# Patient Record
Sex: Male | Born: 1935
Health system: Southern US, Community
[De-identification: ages and names within clinical notes are randomized; demographics above are authoritative.]

## PROBLEM LIST (undated history)

## (undated) DIAGNOSIS — C787 Secondary malignant neoplasm of liver and intrahepatic bile duct: Principal | ICD-10-CM

## (undated) DIAGNOSIS — C189 Malignant neoplasm of colon, unspecified: Secondary | ICD-10-CM

## (undated) DIAGNOSIS — I10 Essential (primary) hypertension: Secondary | ICD-10-CM

## (undated) HISTORY — DX: Malignant neoplasm of colon, unspecified: C18.9

## (undated) HISTORY — DX: Secondary malignant neoplasm of liver and intrahepatic bile duct: C78.7

## (undated) HISTORY — PX: CATARACT EXTRACTION: SUR2

## (undated) HISTORY — PX: COLOSTOMY: SHX63

---

## 2006-06-18 ENCOUNTER — Ambulatory Visit (HOSPITAL_COMMUNITY): Admission: RE | Admit: 2006-06-18 | Discharge: 2006-06-18 | Payer: Self-pay | Admitting: Ophthalmology

## 2012-03-12 ENCOUNTER — Emergency Department (INDEPENDENT_AMBULATORY_CARE_PROVIDER_SITE_OTHER)
Admission: EM | Admit: 2012-03-12 | Discharge: 2012-03-12 | Disposition: A | Discharge status: Home / Self Care | Payer: Medicare Other | Source: Home / Self Care | Attending: Family Medicine | Admitting: Family Medicine

## 2012-03-12 ENCOUNTER — Encounter (HOSPITAL_COMMUNITY): Payer: Self-pay

## 2012-03-12 DIAGNOSIS — T63481A Toxic effect of venom of other arthropod, accidental (unintentional), initial encounter: Secondary | ICD-10-CM

## 2012-03-12 DIAGNOSIS — T6391XA Toxic effect of contact with unspecified venomous animal, accidental (unintentional), initial encounter: Secondary | ICD-10-CM

## 2012-03-12 MED ORDER — DIPHENHYDRAMINE HCL 25 MG PO CAPS
25.0000 mg | ORAL_CAPSULE | Freq: Once | ORAL | Status: AC
  Administered 2012-03-12: 25 mg via ORAL

## 2012-03-12 MED ORDER — DIPHENHYDRAMINE HCL 25 MG PO CAPS
ORAL_CAPSULE | ORAL | Status: AC
  Filled 2012-03-12: qty 1

## 2012-03-12 MED ORDER — FLUTICASONE PROPIONATE 0.05 % EX CREA: TOPICAL_CREAM | Freq: Two times a day (BID) | CUTANEOUS | Status: DC

## 2012-03-12 NOTE — ED Provider Notes (Signed)
History     CSN: [id number]  Arrival date & time 03/12/12  1421   First MD Initiated Contact with Patient 03/12/12 1506      Chief Complaint  Patient presents with  . Insect Bite    (Consider location/radiation/quality/duration/timing/severity/associated sxs/prior treatment) Patient is a 78 y.o. male presenting with hand injury. The history is provided by the patient.  Hand Injury  The incident occurred yesterday. The incident occurred at home. Injury mechanism: stung by yellow jackets on dorsum of hand.continues swollen. The pain is present in the right hand.    History reviewed. No pertinent past medical history.  History reviewed. No pertinent past surgical history.  History reviewed. No pertinent family history.  History  Substance Use Topics  . Smoking status: Never Smoker   . Smokeless tobacco: Not on file  . Alcohol Use: Yes      Review of Systems  Constitutional: Negative.   HENT: Negative for trouble swallowing.   Respiratory: Negative for shortness of breath and wheezing.   Cardiovascular: Negative for chest pain and palpitations.    Allergies  Review of patient's allergies indicates no known allergies.  Home Medications   Current Outpatient Rx  Name Route Sig Dispense Refill  . FLUTICASONE PROPIONATE 0.05 % EX CREA Topical Apply topically 2 (two) times daily. 30 g 0    BP 149/76  Pulse 73  Temp 98.3 F (36.8 C) (Oral)  Resp 16  SpO2 96%  Physical Exam  Nursing note and vitals reviewed. Constitutional: He is oriented to person, place, and time. He appears well-developed and well-nourished.  HENT:  Mouth/Throat: Oropharynx is clear and moist.  Cardiovascular: Regular rhythm and normal heart sounds.   Pulmonary/Chest: Breath sounds normal. He has no wheezes.  Neurological: He is alert and oriented to person, place, and time.  Skin: Skin is warm and dry.       Local sts at sting sites x2 on dorsum of right hand., no erythema or tenderness.     ED Course  Procedures (including critical care time)  Labs Reviewed - No data to display No results found.   1. Local reaction to insect sting       MDM          Linna Hoff, MD 03/12/12 [phone]

## 2012-03-12 NOTE — ED Notes (Signed)
Bitten several times yesterday on right hand while cutting grass; hand swollen, NAD

## 2012-03-12 NOTE — Discharge Instructions (Signed)
Use ice, benadryl and cream for swelling and itching as needed.

## 2012-11-06 ENCOUNTER — Encounter (HOSPITAL_COMMUNITY): Payer: Self-pay

## 2012-11-06 ENCOUNTER — Inpatient Hospital Stay (HOSPITAL_COMMUNITY): Payer: Medicare Other

## 2012-11-06 ENCOUNTER — Inpatient Hospital Stay (HOSPITAL_COMMUNITY)
Admission: EM | Admit: 2012-11-06 | Discharge: 2012-11-11 | DRG: 378 | Disposition: A | Discharge status: Home Health | Payer: Medicare Other | Attending: Internal Medicine | Admitting: Internal Medicine

## 2012-11-06 ENCOUNTER — Encounter (HOSPITAL_COMMUNITY)
Admission: EM | Disposition: A | Discharge status: Home Health | Payer: Self-pay | Source: Home / Self Care | Attending: Family Medicine

## 2012-11-06 DIAGNOSIS — Z531 Procedure and treatment not carried out because of patient's decision for reasons of belief and group pressure: Secondary | ICD-10-CM

## 2012-11-06 DIAGNOSIS — R5383 Other fatigue: Secondary | ICD-10-CM | POA: Diagnosis present

## 2012-11-06 DIAGNOSIS — K264 Chronic or unspecified duodenal ulcer with hemorrhage: Principal | ICD-10-CM

## 2012-11-06 DIAGNOSIS — IMO0001 Reserved for inherently not codable concepts without codable children: Secondary | ICD-10-CM

## 2012-11-06 DIAGNOSIS — R5381 Other malaise: Secondary | ICD-10-CM | POA: Diagnosis present

## 2012-11-06 DIAGNOSIS — R531 Weakness: Secondary | ICD-10-CM

## 2012-11-06 DIAGNOSIS — Z8711 Personal history of peptic ulcer disease: Secondary | ICD-10-CM

## 2012-11-06 DIAGNOSIS — I10 Essential (primary) hypertension: Secondary | ICD-10-CM | POA: Diagnosis present

## 2012-11-06 DIAGNOSIS — K2980 Duodenitis without bleeding: Secondary | ICD-10-CM | POA: Diagnosis present

## 2012-11-06 DIAGNOSIS — K922 Gastrointestinal hemorrhage, unspecified: Secondary | ICD-10-CM

## 2012-11-06 DIAGNOSIS — D62 Acute posthemorrhagic anemia: Secondary | ICD-10-CM

## 2012-11-06 DIAGNOSIS — F1010 Alcohol abuse, uncomplicated: Secondary | ICD-10-CM

## 2012-11-06 HISTORY — PX: ESOPHAGOGASTRODUODENOSCOPY: SHX5428

## 2012-11-06 LAB — COMPREHENSIVE METABOLIC PANEL
ALT: 5 U/L (ref 0–53)
AST: 14 U/L (ref 0–37)
Albumin: 2.8 g/dL — ABNORMAL LOW (ref 3.5–5.2)
CO2: 27 mEq/L (ref 19–32)
Calcium: 9 mg/dL (ref 8.4–10.5)
Chloride: 104 mEq/L (ref 96–112)
Creatinine, Ser: 1.22 mg/dL (ref 0.50–1.35)
GFR calc non Af Amer: 54 mL/min — ABNORMAL LOW (ref >90)
Sodium: 138 mEq/L (ref 135–145)

## 2012-11-06 LAB — TYPE AND SCREEN: Antibody Screen: NEGATIVE

## 2012-11-06 LAB — CBC WITH DIFFERENTIAL/PLATELET
Basophils Relative: 0 % (ref 0–1)
Eosinophils Absolute: 0.1 10*3/uL (ref 0.0–0.7)
Eosinophils Relative: 1 % (ref 0–5)
Hemoglobin: 7.8 g/dL — ABNORMAL LOW (ref 13.0–17.0)
Lymphs Abs: 1.7 10*3/uL (ref 0.7–4.0)
MCH: 28.6 pg (ref 26.0–34.0)
MCHC: 34.2 g/dL (ref 30.0–36.0)
MCV: 83.5 fL (ref 78.0–100.0)
Monocytes Absolute: 0.6 10*3/uL (ref 0.1–1.0)
Monocytes Relative: 5 % (ref 3–12)
RBC: 2.73 MIL/uL — ABNORMAL LOW (ref 4.22–5.81)

## 2012-11-06 LAB — CBC
Hemoglobin: 8.2 g/dL — ABNORMAL LOW (ref 13.0–17.0)
Hemoglobin: 7.3 g/dL — ABNORMAL LOW (ref 13.0–17.0)
MCH: 28.2 pg (ref 26.0–34.0)
MCH: 28.3 pg (ref 26.0–34.0)
MCHC: 33.9 g/dL (ref 30.0–36.0)
MCHC: 34.1 g/dL (ref 30.0–36.0)
MCHC: 33.8 g/dL (ref 30.0–36.0)
Platelets: 161 10*3/uL (ref 150–400)
Platelets: 145 10*3/uL — ABNORMAL LOW (ref 150–400)
Platelets: 149 10*3/uL — ABNORMAL LOW (ref 150–400)
RBC: 2.6 MIL/uL — ABNORMAL LOW (ref 4.22–5.81)
RDW: 15 % (ref 11.5–15.5)
RDW: 15.4 % (ref 11.5–15.5)
WBC: 14.1 10*3/uL — ABNORMAL HIGH (ref 4.0–10.5)
WBC: 13.4 10*3/uL — ABNORMAL HIGH (ref 4.0–10.5)

## 2012-11-06 LAB — OCCULT BLOOD, POC DEVICE: Fecal Occult Bld: POSITIVE — AB

## 2012-11-06 LAB — ABO/RH: ABO/RH(D): O POS

## 2012-11-06 SURGERY — EGD (ESOPHAGOGASTRODUODENOSCOPY)
Anesthesia: Moderate Sedation

## 2012-11-06 MED ORDER — LORAZEPAM 2 MG/ML IJ SOLN: 0.0000 mg | Freq: Two times a day (BID) | INTRAMUSCULAR | Status: AC

## 2012-11-06 MED ORDER — LORAZEPAM 2 MG/ML IJ SOLN: 0.0000 mg | Freq: Four times a day (QID) | INTRAMUSCULAR | Status: AC

## 2012-11-06 MED ORDER — ADULT MULTIVITAMIN W/MINERALS CH
1.0000 | ORAL_TABLET | Freq: Every day | ORAL | Status: DC
  Administered 2012-11-06 – 2012-11-11 (×5): 1 via ORAL
  Filled 2012-11-06 (×6): qty 1

## 2012-11-06 MED ORDER — THIAMINE HCL 100 MG/ML IJ SOLN
100.0000 mg | Freq: Every day | INTRAMUSCULAR | Status: DC
  Filled 2012-11-06 (×6): qty 1

## 2012-11-06 MED ORDER — SODIUM CHLORIDE 0.9 % IJ SOLN
3.0000 mL | Freq: Two times a day (BID) | INTRAMUSCULAR | Status: DC
  Administered 2012-11-08 – 2012-11-10 (×6): 3 mL via INTRAVENOUS

## 2012-11-06 MED ORDER — ACETAMINOPHEN 325 MG PO TABS: 650.0000 mg | ORAL_TABLET | Freq: Four times a day (QID) | ORAL | Status: DC | PRN

## 2012-11-06 MED ORDER — FENTANYL CITRATE 0.05 MG/ML IJ SOLN
INTRAMUSCULAR | Status: AC
  Filled 2012-11-06: qty 2

## 2012-11-06 MED ORDER — ONDANSETRON HCL 4 MG PO TABS: 4.0000 mg | ORAL_TABLET | Freq: Four times a day (QID) | ORAL | Status: DC | PRN

## 2012-11-06 MED ORDER — MIDAZOLAM HCL 10 MG/2ML IJ SOLN
INTRAMUSCULAR | Status: DC | PRN
  Administered 2012-11-06 (×2): 2 mg via INTRAVENOUS

## 2012-11-06 MED ORDER — SODIUM CHLORIDE 0.9 % IV SOLN
INTRAVENOUS | Status: DC
  Administered 2012-11-06: 13:00:00 via INTRAVENOUS
  Administered 2012-11-08: 1000 mL via INTRAVENOUS

## 2012-11-06 MED ORDER — EPINEPHRINE HCL 0.1 MG/ML IJ SOLN
INTRAMUSCULAR | Status: AC
  Filled 2012-11-06: qty 10

## 2012-11-06 MED ORDER — MORPHINE SULFATE 2 MG/ML IJ SOLN: 2.0000 mg | INTRAMUSCULAR | Status: DC | PRN

## 2012-11-06 MED ORDER — LORAZEPAM 1 MG PO TABS: 1.0000 mg | ORAL_TABLET | Freq: Four times a day (QID) | ORAL | Status: AC | PRN

## 2012-11-06 MED ORDER — ACETAMINOPHEN 650 MG RE SUPP: 650.0000 mg | Freq: Four times a day (QID) | RECTAL | Status: DC | PRN

## 2012-11-06 MED ORDER — FOLIC ACID 1 MG PO TABS
1.0000 mg | ORAL_TABLET | Freq: Every day | ORAL | Status: DC
  Administered 2012-11-06 – 2012-11-11 (×5): 1 mg via ORAL
  Filled 2012-11-06 (×6): qty 1

## 2012-11-06 MED ORDER — ONDANSETRON HCL 4 MG/2ML IJ SOLN: 4.0000 mg | Freq: Four times a day (QID) | INTRAMUSCULAR | Status: DC | PRN

## 2012-11-06 MED ORDER — VITAMIN B-1 100 MG PO TABS
100.0000 mg | ORAL_TABLET | Freq: Every day | ORAL | Status: DC
  Administered 2012-11-06 – 2012-11-11 (×5): 100 mg via ORAL
  Filled 2012-11-06 (×6): qty 1

## 2012-11-06 MED ORDER — INFLUENZA VIRUS VACC SPLIT PF IM SUSP
0.5000 mL | INTRAMUSCULAR | Status: AC
  Filled 2012-11-06: qty 0.5

## 2012-11-06 MED ORDER — FENTANYL CITRATE 0.05 MG/ML IJ SOLN
INTRAMUSCULAR | Status: DC | PRN
  Administered 2012-11-06: 15 ug via INTRAVENOUS
  Administered 2012-11-06: 10 ug via INTRAVENOUS
  Administered 2012-11-06: 25 ug via INTRAVENOUS

## 2012-11-06 MED ORDER — SODIUM CHLORIDE 0.9 % IV SOLN: INTRAVENOUS | Status: DC

## 2012-11-06 MED ORDER — BUTAMBEN-TETRACAINE-BENZOCAINE 2-2-14 % EX AERO
INHALATION_SPRAY | CUTANEOUS | Status: DC | PRN
  Administered 2012-11-06: 2 via TOPICAL

## 2012-11-06 MED ORDER — LORAZEPAM 2 MG/ML IJ SOLN: 1.0000 mg | Freq: Four times a day (QID) | INTRAMUSCULAR | Status: AC | PRN

## 2012-11-06 MED ORDER — PANTOPRAZOLE SODIUM 40 MG IV SOLR
40.0000 mg | Freq: Once | INTRAVENOUS | Status: AC
  Administered 2012-11-06: 40 mg via INTRAVENOUS
  Filled 2012-11-06: qty 40

## 2012-11-06 MED ORDER — SODIUM CHLORIDE 0.9 % IV SOLN
8.0000 mg/h | INTRAVENOUS | Status: DC
  Administered 2012-11-06 – 2012-11-08 (×3): 8 mg/h via INTRAVENOUS
  Filled 2012-11-06 (×9): qty 80

## 2012-11-06 MED ORDER — SODIUM CHLORIDE 0.9 % IJ SOLN
INTRAMUSCULAR | Status: DC | PRN
  Administered 2012-11-06: 11:00:00

## 2012-11-06 MED ORDER — SODIUM CHLORIDE 0.9 % IV BOLUS (SEPSIS)
500.0000 mL | Freq: Once | INTRAVENOUS | Status: AC
  Administered 2012-11-06: 500 mL via INTRAVENOUS

## 2012-11-06 MED ORDER — MIDAZOLAM HCL 5 MG/ML IJ SOLN
INTRAMUSCULAR | Status: AC
  Filled 2012-11-06: qty 2

## 2012-11-06 MED ORDER — DIPHENHYDRAMINE HCL 50 MG/ML IJ SOLN
INTRAMUSCULAR | Status: AC
  Filled 2012-11-06: qty 1

## 2012-11-06 NOTE — ED Notes (Signed)
Returned from xray

## 2012-11-06 NOTE — Op Note (Signed)
[facility] [address] [place], [address]   ENDOSCOPY PROCEDURE REPORT  PATIENT: Harold Price, Harold Price  MR#: [id number] BIRTHDATE: 27-Dec-1929 , 82  yrs. old GENDER: Male  ENDOSCOPIST: Charlott Rakes, MD REFERRED XB:[id number] team  PROCEDURE DATE:  11/06/2012 PROCEDURE:   EGD w/ control of bleeding ASA CLASS:   Class II INDICATIONS:Melena.   Hematemesis. MEDICATIONS: Fentanyl-Detailed 60 mcg IV, Versed 5 mg IV, Cetacaine spray x 2, and Epinephrine 7 cc   submucosally  TOPICAL ANESTHETIC:  DESCRIPTION OF PROCEDURE:   After the risks benefits and alternatives of the procedure were thoroughly explained, informed consent was obtained.  The Pentax Gastroscope [id number]  endoscope was introduced through the mouth and advanced to the second portion of the duodenum , limited by Without limitations.   The instrument was slowly withdrawn as the mucosa was fully examined.     FINDINGS: The endoscope was inserted into the oropharynx and esophagus was intubated.  The gastroesophageal junction was noted to be 40 cm from the incisors. Esophagus was normal. Endoscope was advanced into the stomach, which revealed clear fluid and normal-appearing gastric mucosa.  The endoscope was advanced to the duodenal bulb where a 4 mm clean-based superficial ulcer was noted in the distal portion. At the genu was a 6 mm ulcer where a small raised red spot was seen and a diffuse flat red area was noted in the base that was friable and started bleeding during the procedure. The second portion of the duodenum was unremarkable. Epinephrine:saline 1:10,000 U mixture was injected submucosally around the ulcer with the red area and good blanching and blebbing was noted. A total of 7cc of OZH:[id number] mixture was injected. The endoscope was withdrawn back into the stomach and retroflexion was normal.  COMPLICATIONS: None  ENDOSCOPIC IMPRESSION:     Duodenal ulcer at genu with  bleeding stigmata -s/p epi injection Clean-based duodenal bulb ulcer noted  RECOMMENDATIONS: Continue Protonix infusion; Ice chips and sips of water ok and if stable can slowly advance tomorrow   REPEAT EXAM: N/A  _______________________________ Charlott Rakes, MD eSigned:  Charlott Rakes, MD 11/06/2012 11:04 AM    CC:  PATIENT NAME:  Preet, Perrier MR#: [id number]

## 2012-11-06 NOTE — ED Notes (Signed)
Patient transported to X-ray 

## 2012-11-06 NOTE — Consult Note (Signed)
Referring Provider: Dr. Irene Limbo Primary Care Physician:  No primary provider on file. Primary Gastroenterologist:  Gentry Fitz  Reason for Consultation:  GI bleed  HPI: Jerimey Burridge is a 79 y.o. male presents with acute onset of black, tarry stools X 1 and vomiting coffee grounds emesis X 1 that started this morning. Had one episode of vomiting after that which was clear. No further GI bleeding since that time. Been nausea for awhile. Heartburn for years. Reports intermittent abdominal pain for years that she would take Tums prn for. Was lightheaded and dizzy today. Denies hematochezia. Denies NSAIDs. Drinks 3-4 beers/day for years. Personal history of stomach ulcer in 2006 that was found after a GI bleed but he denies that it was bleeding when scope was reportedly done. Hgb 7.8. BUN 48. Jehovah's Witness.  Past Medical History  Diagnosis Date  . Hypertension   . PUD (peptic ulcer disease)     2006    Past Surgical History  Procedure Laterality Date  . Cataract extraction Right     Prior to Admission medications   Not on File    Scheduled Meds: Continuous Infusions: PRN Meds:.    Allergies as of 11/06/2012  . (No Known Allergies)    Family History  Problem Relation Age of Onset  . Ulcers Brother     History   Social History  . Marital Status: Married    Spouse Name: N/A    Number of Children: N/A  . Years of Education: N/A   Occupational History  . Not on file.   Social History Main Topics  . Smoking status: Never Smoker   . Smokeless tobacco: Not on file  . Alcohol Use: Yes  . Drug Use: No  . Sexually Active: Not on file   Other Topics Concern  . Not on file   Social History Narrative  . No narrative on file    Review of Systems: All negative except as stated above in HPI.  Physical Exam: Vital signs: Filed Vitals:   11/06/12 0800  BP: 128/59  Pulse: 82  Temp: 98.2  Resp: 20     General:   Elderly, Alert,  Well-developed, well-nourished,  pleasant and cooperative in NAD HEENT: anicteric Neck: supple, nontender Lungs:  Clear throughout to auscultation.   No wheezes, crackles, or rhonchi. No acute distress. Heart:  Regular rate and rhythm; no murmurs, clicks, rubs,  or gallops. Abdomen: soft, NT, ND, +BS  Rectal:  Deferred Ext: no edema  GI:  Lab Results:  Recent Labs  11/06/12 0429  WBC 11.4*  HGB 7.8*  HCT 22.8*  PLT 132*   BMET  Recent Labs  11/06/12 0429  NA 138  K 4.9  CL 104  CO2 27  GLUCOSE 163*  BUN 48*  CREATININE 1.22  CALCIUM 9.0   LFT  Recent Labs  11/06/12 0429  PROT 5.0*  ALBUMIN 2.8*  AST 14  ALT 5  ALKPHOS 31*  BILITOT 0.1*   PT/INR  Recent Labs  11/06/12 0429  LABPROT 13.7  INR 1.06     Studies/Results: Dg Abd Acute W/chest  11/06/2012  *RADIOLOGY REPORT*  Clinical Data: Nausea and vomiting.  ACUTE ABDOMEN SERIES (ABDOMEN 2 VIEW & CHEST 1 VIEW)  Comparison: 06/15/2006  Findings: No evidence for free air on the decubitus image. Nonspecific bowel gas pattern.  Degenerative changes in the spine. Lungs are clear without focal disease. Heart and mediastinum are within normal limits.  IMPRESSION: No acute findings.   Original Report Authenticated  By: Richarda Overlie, M.D.     Impression/Plan: 79yo with acute onset of upper GI bleed with one episode of melena and one episode of coffee grounds emesis without preceding retching or vomiting. Elevated BUN and Hgb 7.8. Suspect peptic ulcer bleed and needs EGD today. Agree with Protonix drip. NPO. Patient aware and agreeable to do procedure. Informed consent will be obtained prior to EGD.    LOS: 0 days   Ajeenah Heiny C.  11/06/2012, 8:50 AM

## 2012-11-06 NOTE — Interval H&P Note (Signed)
History and Physical Interval Note:  11/06/2012 10:26 AM  Harold Price  has presented today for surgery, with the diagnosis of Gi bleed   The various methods of treatment have been discussed with the patient and family. After consideration of risks, benefits and other options for treatment, the patient has consented to  Procedure(s): ESOPHAGOGASTRODUODENOSCOPY (EGD) (N/A) as a surgical intervention .  The patient's history has been reviewed, patient examined, no change in status, stable for surgery.  I have reviewed the patient's chart and labs.  Questions were answered to the patient's satisfaction.     Hakan Nudelman C.

## 2012-11-06 NOTE — H&P (Signed)
History and Physical  Harold Price ZOX:[id number] DOB: 12/25/1929 DOA: 11/06/2012  Referring physician: Zadie Rhine, MD PCP: No primary provider on file. None  Chief Complaint: dark stool and vomit  HPI:  79 year old man with history of peptic ulcer disease presented to emergency department with history of dark stools, coffee-ground emesis. Initial evaluation notable for anemia and patient admitted for presumed upper GI bleed.  Patient reports a history of ulcer disease approximately 8 years ago. He underwent upper and lower endoscopy in [place]. He was put on medication but is not sure whether he had any other treatment. He has had no bleeding since. He drinks 3-4 drinks of beer or liquor a day. He denies NSAID use except for aspirin once or twice per week. Last night he had stomach upset which resolved with TUMS at approximately 10 PM. At 1 AM he woke up and went to the bathroom, he had generalized weakness and almost passed out. He had a very dark bowel movement. He had several episodes of dark emesis. He has not had any further vomiting or bowel movements since presentation to the emergency department.  In the emergency department noted be afebrile, normotensive, vitals stable. Hemoglobin 7.8, MCV normal, BUN 48 with normal creatinine. Acute abdominal series negative. EKG nonacute.  Review of Systems:  Negative for visual changes, sore throat, rash, new muscle aches, chest pain, SOB, dysuria, nnausea  Positive for subjective fever, dizzyness, stomach pain  Past Medical History  Diagnosis Date  . Hypertension   . PUD (peptic ulcer disease)     2006    Past Surgical History  Procedure Laterality Date  . Cataract extraction Right     Social History:  reports that he has never smoked. He does not have any smokeless tobacco history on file. He reports that  drinks alcohol. He reports that he does not use illicit drugs. 3-4 drinks/day sometimes beer sometimes liquor  No Known  Allergies  Family History  Problem Relation Age of Onset  . Ulcers Brother      Prior to Admission medications   Not on File   Physical Exam: Filed Vitals:   11/06/12 0415 11/06/12 0445 11/06/12 0501 11/06/12 0621  BP: 122/52 121/56 114/63 113/62  Pulse: 103 86 87 82  Temp:    98.2 F (36.8 C)  TempSrc:    Oral  Resp: 20 14 15 14   SpO2: 100% 100% 100% 100%   General:  Examined in ED. Appears calm and comfortable. Eyes: Left pupil round and reactive, right pupil eccentric and nonreactive, arcus senilis bilateral, normal lids, irises  ENT: grossly normal hearing, lips & tongue Neck: no LAD, masses or thyromegaly Cardiovascular: RRR, no m/r/g. No LE edema. Respiratory: CTA bilaterally, no w/r/r. Normal respiratory effort. Abdomen: soft, ntnd, no masses appreciated Skin: no rash or induration seen on limited exam, chronic skin changes bilateral anterior distal lower extremities Musculoskeletal: grossly normal tone BUE/BLE Psychiatric: grossly normal mood and affect, speech fluent and appropriate Neurologic: grossly non-focal.  Wt Readings from Last 3 Encounters:  No data found for Wt    Labs on Admission:  Basic Metabolic Panel:  Recent Labs Lab 11/06/12 0429  NA 138  K 4.9  CL 104  CO2 27  GLUCOSE 163*  BUN 48*  CREATININE 1.22  CALCIUM 9.0    Liver Function Tests:  Recent Labs Lab 11/06/12 0429  AST 14  ALT 5  ALKPHOS 31*  BILITOT 0.1*  PROT 5.0*  ALBUMIN 2.8*   CBC:  Recent  Labs Lab 11/06/12 0429  WBC 11.4*  NEUTROABS 9.0*  HGB 7.8*  HCT 22.8*  MCV 83.5  PLT 132*     Radiological Exams on Admission: Dg Abd Acute W/chest  11/06/2012  *RADIOLOGY REPORT*  Clinical Data: Nausea and vomiting.  ACUTE ABDOMEN SERIES (ABDOMEN 2 VIEW & CHEST 1 VIEW)  Comparison: 06/15/2006  Findings: No evidence for free air on the decubitus image. Nonspecific bowel gas pattern.  Degenerative changes in the spine. Lungs are clear without focal disease. Heart and  mediastinum are within normal limits.  IMPRESSION: No acute findings.   Original Report Authenticated By: Richarda Overlie, M.D.     EKG: Independently reviewed. Sinus rhythm, PACs, nonspecific ST changes.   Principal Problem:   Acute GI bleeding Active Problems:   Acute blood loss anemia   Generalized weakness   Alcohol abuse   History of ulcer disease   Refusal of blood transfusions as patient is Jehovah's Witness   Assessment/Plan 1. Acute GI bleed: Likely upper given history of peptic ulcer disease, history of present illness and elevated BUN. Suspect significant acute blood loss given normal MCV. IV Protonix infusion, GI consultation. Serial CBC. 2. Acute blood loss anemia: Serial CBC. Jehovah's Witness and refuses blood blood products. I discussed the suspected severe nature of acute blood loss with him, the possibility of further blood loss resulting in severe disability or even death, he understands but nevertheless refuses any blood products. 3. Generalized weakness: Secondary to acute blood loss 4. Alcohol abuse: CIWA. Monitor for drop.  5. History of ulcer 6. Jehovah's Witness: No blood products.   Code Status: Full code Family Communication: none present Disposition Plan/Anticipated LOS: admit, 3-4 days  Time spent: 60 minutes  Brendia Sacks, MD  [facility] Pager [phone] 11/06/2012, 7:16 AM

## 2012-11-06 NOTE — Progress Notes (Signed)
Brought in from the ED and took down to endoscopy  at 9:38 by bed stable.

## 2012-11-06 NOTE — Brief Op Note (Signed)
Duodenal ulcer with bleeding stigmata - s/p epi injection. Sips of clears and ice chips only today. Continue Protonix infusion.

## 2012-11-06 NOTE — Progress Notes (Signed)
Received patient from endoscopy, patient alert and oriented x4.  No acute distress. Family at the bedside.  VSS and charted in doc flow.  Tele monitoring placed back on patient.  Will continue to monitor.

## 2012-11-06 NOTE — ED Provider Notes (Signed)
History     CSN: [id number]  Arrival date & time 11/06/12  4098   First MD Initiated Contact with Patient 11/06/12 [phone]      Chief Complaint  Patient presents with  . Nausea  . Emesis     Patient is a 79 y.o. male presenting with vomiting. The history is provided by the patient.  Emesis Severity:  Moderate Duration:  7 hours Timing:  Constant Emesis appearance: black emesis. Progression:  Worsening Chronicity:  New Relieved by:  Nothing Worsened by:  Nothing tried Associated symptoms: abdominal pain   Risk factors: alcohol use   pt reports he started to have vomiting and diarrhea earlier and he reports both vomit and stool were black.  He reports CP with vomiting.  He reports mild abdominal pain. No syncope reported  He reports he drinks ETOH frequently.  He does mention h/o ulcers in the past   Past Medical History  Diagnosis Date  . Hypertension     No past surgical history on file.  No family history on file.  History  Substance Use Topics  . Smoking status: Never Smoker   . Smokeless tobacco: Not on file  . Alcohol Use: Yes      Review of Systems  Constitutional: Positive for fatigue. Negative for fever.  Respiratory: Negative for shortness of breath.   Gastrointestinal: Positive for vomiting and abdominal pain.  Neurological: Positive for weakness.  All other systems reviewed and are negative.    Allergies  Review of patient's allergies indicates no known allergies.  Home Medications   Current Outpatient Rx  Name  Route  Sig  Dispense  Refill  . fluticasone (CUTIVATE) 0.05 % cream   Topical   Apply topically 2 (two) times daily.   30 g   0     BP 115/54  Temp(Src) 97.9 F (36.6 C) (Oral)  SpO2 99% BP 113/62  Pulse 82  Temp(Src) 98.2 F (36.8 C) (Oral)  Resp 14  SpO2 100%  Physical Exam CONSTITUTIONAL: Well developed/well nourished HEAD AND FACE: Normocephalic/atraumatic EYES: EOMI/PERRL ENMT: Mucous membranes dry NECK:  supple no meningeal signs SPINE:entire spine nontender CV: S1/S2 noted, no murmurs/rubs/gallops noted LUNGS: Lungs are clear to auscultation bilaterally, no apparent distress ABDOMEN: soft, nontender, no rebound or guarding GU:no cva tenderness Rectal - stool color black, hemoccult positive NEURO: Pt is awake/alert, moves all extremitiesx4 EXTREMITIES: pulses normal, full ROM SKIN: warm PSYCH: no abnormalities of mood noted  ED Course  Procedures  Labs Reviewed  CBC WITH DIFFERENTIAL - Abnormal; Notable for the following:    WBC 11.4 (*)    RBC 2.73 (*)    Hemoglobin 7.8 (*)    HCT 22.8 (*)    Platelets 132 (*)    Neutrophils Relative 79 (*)    Neutro Abs 9.0 (*)    All other components within normal limits  OCCULT BLOOD, POC DEVICE - Abnormal; Notable for the following:    Fecal Occult Bld POSITIVE (*)    All other components within normal limits  PROTIME-INR  APTT  COMPREHENSIVE METABOLIC PANEL  TYPE AND SCREEN  ABO/RH   5:03 AM Pt with acute GI bleed.  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 IV fluids and protonix ordered Will need admission His BP is currently >100 6:40 AM PT UPDATED ON PLAN AWAITING CALL BACK FROM HOSPITALIST 7:28 AM D/w dr Glenna Durand with [facility] He requests acute abdominal series and he will f/u on results Will  place on stepdown He will call GI He understands pt does not wish to have blood products   MDM  Nursing notes including past medical history and social history reviewed and considered in documentation Labs/vital reviewed and considered        Date: 11/06/2012  Rate: 85  Rhythm: normal sinus rhythm  QRS Axis: normal  Intervals: normal  ST/T Wave abnormalities: nonspecific ST changes  Conduction Disutrbances:none  Narrative Interpretation:   Old EKG Reviewed: none available AT TIME OF INTERPRETATION    Joya Gaskins, MD 11/06/12 [phone]

## 2012-11-06 NOTE — ED Notes (Signed)
Pt states he has been vomiting dark black emesis and had dark BM since yesterday

## 2012-11-07 ENCOUNTER — Encounter (HOSPITAL_COMMUNITY): Payer: Self-pay

## 2012-11-07 ENCOUNTER — Encounter (HOSPITAL_COMMUNITY)
Admission: EM | Disposition: A | Discharge status: Home Health | Payer: Self-pay | Source: Home / Self Care | Attending: Family Medicine

## 2012-11-07 ENCOUNTER — Inpatient Hospital Stay (HOSPITAL_COMMUNITY): Payer: Medicare Other

## 2012-11-07 DIAGNOSIS — K264 Chronic or unspecified duodenal ulcer with hemorrhage: Principal | ICD-10-CM

## 2012-11-07 HISTORY — PX: ESOPHAGOGASTRODUODENOSCOPY: SHX5428

## 2012-11-07 LAB — CBC
HCT: 19.4 % — ABNORMAL LOW (ref 39.0–52.0)
HCT: 17.9 % — ABNORMAL LOW (ref 39.0–52.0)
Hemoglobin: 6.4 g/dL — CL (ref 13.0–17.0)
MCH: 27.9 pg (ref 26.0–34.0)
MCHC: 33 g/dL (ref 30.0–36.0)
MCV: 84.7 fL (ref 78.0–100.0)
MCV: 83.6 fL (ref 78.0–100.0)
MCV: 84.2 fL (ref 78.0–100.0)
Platelets: 151 K/uL (ref 150–400)
Platelets: 131 10*3/uL — ABNORMAL LOW (ref 150–400)
RBC: 2.29 MIL/uL — ABNORMAL LOW (ref 4.22–5.81)
RBC: 2.14 MIL/uL — ABNORMAL LOW (ref 4.22–5.81)
RBC: 2.28 MIL/uL — ABNORMAL LOW (ref 4.22–5.81)
RDW: 16 % — ABNORMAL HIGH (ref 11.5–15.5)
RDW: 15.8 % — ABNORMAL HIGH (ref 11.5–15.5)
WBC: 12 K/uL — ABNORMAL HIGH (ref 4.0–10.5)
WBC: 10.8 10*3/uL — ABNORMAL HIGH (ref 4.0–10.5)
WBC: 9.5 10*3/uL (ref 4.0–10.5)

## 2012-11-07 LAB — BASIC METABOLIC PANEL
CO2: 26 mEq/L (ref 19–32)
Chloride: 107 mEq/L (ref 96–112)
Creatinine, Ser: 1.25 mg/dL (ref 0.50–1.35)

## 2012-11-07 SURGERY — EGD (ESOPHAGOGASTRODUODENOSCOPY)
Anesthesia: Moderate Sedation

## 2012-11-07 MED ORDER — FENTANYL CITRATE 0.05 MG/ML IJ SOLN
INTRAMUSCULAR | Status: DC | PRN
  Administered 2012-11-07: 25 ug via INTRAVENOUS

## 2012-11-07 MED ORDER — MIDAZOLAM HCL 10 MG/2ML IJ SOLN
INTRAMUSCULAR | Status: DC | PRN
  Administered 2012-11-07: 1 mg via INTRAVENOUS
  Administered 2012-11-07: 2 mg via INTRAVENOUS

## 2012-11-07 MED ORDER — BUTAMBEN-TETRACAINE-BENZOCAINE 2-2-14 % EX AERO
INHALATION_SPRAY | CUTANEOUS | Status: DC | PRN
  Administered 2012-11-07: 2 via TOPICAL

## 2012-11-07 MED ORDER — MIDAZOLAM HCL 5 MG/ML IJ SOLN
INTRAMUSCULAR | Status: AC
  Filled 2012-11-07: qty 2

## 2012-11-07 MED ORDER — SODIUM CHLORIDE 0.9 % IV SOLN: INTRAVENOUS | Status: DC

## 2012-11-07 MED ORDER — DIPHENHYDRAMINE HCL 50 MG/ML IJ SOLN
INTRAMUSCULAR | Status: AC
  Filled 2012-11-07: qty 1

## 2012-11-07 MED ORDER — FENTANYL CITRATE 0.05 MG/ML IJ SOLN
INTRAMUSCULAR | Status: AC
  Filled 2012-11-07: qty 2

## 2012-11-07 MED ORDER — TECHNETIUM TC 99M-LABELED RED BLOOD CELLS IV KIT
26.0000 | PACK | Freq: Once | INTRAVENOUS | Status: AC | PRN
  Administered 2012-11-07: 26 via INTRAVENOUS

## 2012-11-07 NOTE — Progress Notes (Signed)
Utilization Review Completed.Lauralynn Loeb T2/23/2014  

## 2012-11-07 NOTE — Progress Notes (Signed)
Patient ID: Harold Price, male   DOB: [birth year], 79 y.o.   MRN: [id number] [facility] Gastroenterology Progress Note  Harold Price 79 y.o. [birth year]   Subjective: Sitting in chair. Reports large loose black stool this morning. Hgb 6.1 (refusing blood transfusions due to religion). Denies abdominal pain. Reports taking 3 aspirin a day at times for headaches and denies other NSAIDs.  Objective: Vital signs in last 24 hours: Filed Vitals:   11/07/12 0727  BP: 124/41  Pulse: 83  Temp: 98.9 F (37.2 C)  Resp:     Physical Exam: Gen: alert, no acute distress Abd: diffuse tenderness with minimal guarding, soft, nondistended, +BS  Lab Results:  Recent Labs  11/06/12 0429 11/07/12 0555  NA 138 138  K 4.9 3.9  CL 104 107  CO2 27 26  GLUCOSE 163* 126*  BUN 48* 25*  CREATININE 1.22 1.25  CALCIUM 9.0 8.4    Recent Labs  11/06/12 0429  AST 14  ALT 5  ALKPHOS 31*  BILITOT 0.1*  PROT 5.0*  ALBUMIN 2.8*    Recent Labs  11/06/12 0429  11/07/12 0053 11/07/12 0555  WBC 11.4*  < > 12.0* 10.8*  NEUTROABS 9.0*  --   --   --   HGB 7.8*  < > 6.4* 6.1*  HCT 22.8*  < > 19.4* 17.9*  MCV 83.5  < > 84.7 83.6  PLT 132*  < > 151 121*  < > = values in this interval not displayed.  Recent Labs  11/06/12 0429  LABPROT 13.7  INR 1.06      Assessment/Plan: 79 yo s/p upper GI bleed from duodenal ulcer. Black stools and continued anemia concerning for recurrent bleed from duodenal ulcer and since patient will not allow blood transfusions his status is tenuous. Hemodynamics ok. Will repeat EGD today and see if duodenal ulcer bleeding and will attempt hemoclipping. If bleeding and clipping not possible or unsuccessful, then would recommend embolization by IR as the next step. Change H/Hs to Q 8 hours. Continue Protonix infusion. NPO. Will check H. Pylori serology.   Harold Price C. 11/07/2012, 10:29 AM

## 2012-11-07 NOTE — Op Note (Signed)
[facility] [address] [place], [address]   ENDOSCOPY PROCEDURE REPORT  PATIENT: Harold Price, Harold Price  MR#: [id number] BIRTHDATE: Apr 01, 1930 , 82  yrs. old GENDER: Male  ENDOSCOPIST: Charlott Rakes, MD REFERRED BY:  PROCEDURE DATE:  11/07/2012 PROCEDURE:   EGD, diagnostic ASA CLASS:   Class III INDICATIONS:repeat EGD to assess for rebleeding of duodenal ulcer MEDICATIONS: Fentanyl 25 mcg IV, Versed 3 mg IV, and Cetacaine spray x 2  TOPICAL ANESTHETIC:  DESCRIPTION OF PROCEDURE:   After the risks benefits and alternatives of the procedure were thoroughly explained, informed consent was obtained.  The Pentax Gastroscope [id number]  endoscope was introduced through the mouth and advanced to the second portion of the duodenum , limited by Without limitations.   The instrument was slowly withdrawn as the mucosa was fully examined.     FINDINGS: The endoscope was inserted into the oropharynx and esophagus was intubated.  The gastroesophageal junction was noted to be 40 cm from the incisors. The esophagus was normal.  Endoscope was advanced into the stomach, which revealed clear fluid and no blood products.  The endoscope was advanced to the duodenal bulb, which revealed a 5 mm clean-based ulcer and scattered nonbleeding red spots consistent with mild duodenitis. At the genu the previously noted ulcer (from previous EGD) is clean-based now without any bleeding stigmata. The second portion of duodenum was unremarkable.  The endoscope was withdrawn back into the stomach and retroflexion revealed a normal proximal stomach.  COMPLICATIONS: None  ENDOSCOPIC IMPRESSION:     Duodenal ulcers again noted - no bleeding stigmata and no blood products noted Mild duodenitis Suspect recent melena was due to previous bleed and not a recurrence If melena persisting, then may need to consider a colonoscopy as the next step  RECOMMENDATIONS: Clear liquid diet;  Advance as tolerated; Follow H/Hs; Continue Protonix infusion today but change to IV Q 12 hours tomorrow if stable   REPEAT EXAM: N/A  _______________________________ Charlott Rakes, MD eSigned:  Charlott Rakes, MD 11/07/2012 2:10 PM    CC:  PATIENT NAME:  Harold Price, Harold Price MR#: [id number]

## 2012-11-07 NOTE — H&P (View-Only) (Signed)
Patient ID: Harold Price, male   DOB: 09/27/1929, 79 y.o.   MRN: [id number] [facility] Progress Note  Leo Fiumara 79 y.o. 01/11/1930   Subjective: Sitting in chair. Reports large loose black stool this morning. Hgb 6.1 (refusing blood transfusions due to religion). Denies abdominal pain. Reports taking 3 aspirin a day at times for headaches and denies other NSAIDs.  Objective: Vital signs in last 24 hours: Filed Vitals:   11/07/12 0727  BP: 124/41  Pulse: 83  Temp: 98.9 F (37.2 C)  Resp:     Physical Exam: Gen: alert, no acute distress Abd: diffuse tenderness with minimal guarding, soft, nondistended, +BS  Lab Results:  Recent Labs  11/06/12 0429 11/07/12 0555  NA 138 138  K 4.9 3.9  CL 104 107  CO2 27 26  GLUCOSE 163* 126*  BUN 48* 25*  CREATININE 1.22 1.25  CALCIUM 9.0 8.4    Recent Labs  11/06/12 0429  AST 14  ALT 5  ALKPHOS 31*  BILITOT 0.1*  PROT 5.0*  ALBUMIN 2.8*    Recent Labs  11/06/12 0429  11/07/12 0053 11/07/12 0555  WBC 11.4*  < > 12.0* 10.8*  NEUTROABS 9.0*  --   --   --   HGB 7.8*  < > 6.4* 6.1*  HCT 22.8*  < > 19.4* 17.9*  MCV 83.5  < > 84.7 83.6  PLT 132*  < > 151 121*  < > = values in this interval not displayed.  Recent Labs  11/06/12 0429  LABPROT 13.7  INR 1.06      Assessment/Plan: 79 yo s/p upper GI bleed from duodenal ulcer. Black stools and continued anemia concerning for recurrent bleed from duodenal ulcer and since patient will not allow blood transfusions his status is tenuous. Hemodynamics ok. Will repeat EGD today and see if duodenal ulcer bleeding and will attempt hemoclipping. If bleeding and clipping not possible or unsuccessful, then would recommend embolization by IR as the next step. Change H/Hs to Q 8 hours. Continue Protonix infusion. NPO. Will check H. Pylori serology.   Majd Tissue C. 11/07/2012, 10:29 AM   

## 2012-11-07 NOTE — Progress Notes (Signed)
[facility] progress note. Chief complaint. Worsening anemia. History of present illness. This 79 year old male was admitted with tarry stools and coffee-ground emesis. I received a call from [facility] indicating the patient's hemoglobin had dropped to 6.4. Of note the patient had been seen by Dr. Bosie Clos gastroenterology yesterday and an upper endoscopy was done. Found a duodenal ulcer with bleeding stigmata and was injected with epinephrine. Prior hemoglobin was 7.3. I did go to see the patient at bedside to ensure his stability. Nursing indicates the patient has had one small and one large tarry stool overnight. 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 He has refused human blood products. I did discuss this with him at the bedside. I indicated the risks of this fairly severe anemia including MI, stroke, and possibly death. The patient does appear competent and verifies that he wishes to receive no edema and blood products. Vital signs. Temperature 99.1, pulse 88, respiration 19, blood pressure 120/55. O2 sats 98%. General appearance. Well-developed elderly male who is alert, cooperative, and in no distress. Cardiac. Rate and rhythm regular. Lungs. Breath sounds clear and equal. Abdomen. Soft with positive bowel sounds. No pain with palpation. Impression/plan. Problem #1. Progressive anemia. This in an elderly male who is Jehovah's Witness and refuses seen and blood products. He affirms this a blood product refusal to me at bedside tonight. I did discuss the case with Dr. Berkley Harvey gastroenterology. He indicated to me that if further drop in hemoglobin is seen patient may need a further endoscopy to readdress this ulcer/bleeding. I he suggested we decreased the frequency of CBC checks from every 4 to every 6 hours to consider blood. Also asked that I make patient n.p.o. which I have done. Currently the patient appears hemodynamically stable per bedside evaluation.

## 2012-11-07 NOTE — Progress Notes (Signed)
[facility] PROGRESS NOTE  Harold Price LKG:[id number] DOB: [birth year] DOA: 11/06/2012 PCP: No primary provider on file. none  Assessment/Plan: 1. Acute upper GI bleed secondary to duodenal ulcer: Status post endoscopy 2/22 which revealed duodenal ulcer with stigmata of bleeding which was injected with epinephrine. Continue IV Protonix infusion. Minimize blood draws. 2. Acute blood loss anemia with progression: Continue to monitor CBC but increased interval S. patient refuses blood products. Patient clearly understands the risks associated with refusing blood products including disability and death both verbally and by signing document. 3. Generalized weakness: Secondary to acute blood loss. 4. Alcohol abuse: No signs of withdrawal. CIWA. Monitor for drop.  5. History of ulcer 6. Jehovah's Witness: No blood products.  Discussed plan with daytime RN--monitor for signs or symptoms of instability.  Code Status: Full code  Family Communication: none present  Disposition Plan: Pending medical stability, likely home.   Brendia Sacks, MD  [facility] Team 7 Pager [phone] If 7PM-7AM, please contact night-coverage at [email or web address], password [facility] 11/07/2012, 8:22 AM  LOS: 1 day   Brief narrative: 79 year old man with history of peptic ulcer disease presented to emergency department with history of dark stools, coffee-ground emesis. Initial evaluation notable for anemia and patient admitted for presumed upper GI bleed.  He was seen in consultation with gastroenterology and underwent same-day upper endoscopy which revealed duodenal ulcer with bleeding stigmata which was injected with epinephrine. Hemoglobin is slowly trended down patient refuses blood products.  Consultants:  [facility]  Procedures:  2/22 Duodenal ulcer at genu with bleeding stigmata -s/p epi injection. Clean-based duodenal bulb ulcer noted  HPI/Subjective: Hemoglobin has trended down overnight.  Risks associated with profound anemia were again discussed with the patient by night coverage the patient continues to refuse blood products and accepts the risks associated with that.  Patient denies chest pain, shortness of breath. Reports several dark bowel movements. Discussed with RN--2 small black bowel movements and one large one overnight.  Objective: Filed Vitals:   11/07/12 0300 11/07/12 0400 11/07/12 0500 11/07/12 0727  BP:  124/41    Pulse: 98 107 83   Temp:  98.9 F (37.2 C)  98.9 F (37.2 C)  TempSrc:  Oral  Oral  Resp: 13 31 19    Height:      Weight:      SpO2: 100% 100% 95%     Intake/Output Summary (Last 24 hours) at 11/07/12 7425 Last data filed at 11/07/12 0600  Gross per 24 hour  Intake 2890.42 ml  Output    728 ml  Net 2162.42 ml   Filed Weights   11/06/12 0949  Weight: 90.719 kg (200 lb)    Exam:  General:  Appears calm and comfortable. Nontoxic. Cardiovascular: RRR, no m/r/g. No LE edema. Respiratory: CTA bilaterally, no w/r/r. Normal respiratory effort. Psychiatric: grossly normal mood and affect, speech fluent and appropriate Neurologic: grossly non-focal.  Data Reviewed: Basic Metabolic Panel:  Recent Labs Lab 11/06/12 0429 11/07/12 0555  NA 138 138  K 4.9 3.9  CL 104 107  CO2 27 26  GLUCOSE 163* 126*  BUN 48* 25*  CREATININE 1.22 1.25  CALCIUM 9.0 8.4   Liver Function Tests:  Recent Labs Lab 11/06/12 0429  AST 14  ALT 5  ALKPHOS 31*  BILITOT 0.1*  PROT 5.0*  ALBUMIN 2.8*   CBC:  Recent Labs Lab 11/06/12 0429  11/06/12 1346 11/06/12 1652 11/06/12 2002 11/07/12 0053 11/07/12 0555  WBC 11.4*  < > 13.9* 14.1* 13.4* 12.0*  10.8*  NEUTROABS 9.0*  --   --   --   --   --   --   HGB 7.8*  < > 7.9* 7.6* 7.3* 6.4* 6.1*  HCT 22.8*  < > 23.2* 22.4* 21.6* 19.4* 17.9*  MCV 83.5  < > 83.2 83.3 83.1 84.7 83.6  PLT 132*  < > 145* 149* 142* 151 121*  < > = values in this interval not displayed.   Recent Results (from the  past 240 hour(s))  MRSA PCR SCREENING     Status: None   Collection Time    11/06/12 10:09 AM      Result Value Range Status   MRSA by PCR NEGATIVE  NEGATIVE Final   Comment:            The GeneXpert MRSA Assay (FDA     approved for NASAL specimens     only), is one component of a     comprehensive MRSA colonization     surveillance program. It is not     intended to diagnose MRSA     infection nor to guide or     monitor treatment for     MRSA infections.     Studies: Dg Abd Acute W/chest  11/06/2012  *RADIOLOGY REPORT*  Clinical Data: Nausea and vomiting.  ACUTE ABDOMEN SERIES (ABDOMEN 2 VIEW & CHEST 1 VIEW)  Comparison: 06/15/2006  Findings: No evidence for free air on the decubitus image. Nonspecific bowel gas pattern.  Degenerative changes in the spine. Lungs are clear without focal disease. Heart and mediastinum are within normal limits.  IMPRESSION: No acute findings.   Original Report Authenticated By: Richarda Overlie, M.D.     Scheduled Meds: . folic acid  1 mg Oral Daily  . influenza  inactive virus vaccine  0.5 mL Intramuscular Tomorrow-1000  . LORazepam  0-4 mg Intravenous Q6H   Followed by  . [START ON 11/08/2012] LORazepam  0-4 mg Intravenous Q12H  . multivitamin with minerals  1 tablet Oral Daily  . sodium chloride  3 mL Intravenous Q12H  . thiamine  100 mg Oral Daily   Or  . thiamine  100 mg Intravenous Daily   Continuous Infusions: . sodium chloride 75 mL/hr at 11/07/12 0600  . pantoprozole (PROTONIX) infusion 8 mg/hr (11/07/12 0600)    Principal Problem:   Duodenal ulcer with hemorrhage Active Problems:   Acute blood loss anemia   Generalized weakness   Alcohol abuse   History of ulcer disease   Refusal of blood transfusions as patient is Jehovah's Witness   Acute upper GI bleed     Brendia Sacks, MD  [facility] Team 7 Pager [phone] If 7PM-7AM, please contact night-coverage at [email or web address], password [facility] 11/07/2012, 8:22 AM  LOS: 1 day    Time spent: 20 minutes

## 2012-11-07 NOTE — Interval H&P Note (Signed)
History and Physical Interval Note:  11/07/2012 1:46 PM  Harold Price  has presented today for surgery, with the diagnosis of re-scope, gib  The various methods of treatment have been discussed with the patient and family. After consideration of risks, benefits and other options for treatment, the patient has consented to  Procedure(s): ESOPHAGOGASTRODUODENOSCOPY (EGD) (N/A) as a surgical intervention .  The patient's history has been reviewed, patient examined, no change in status, stable for surgery.  I have reviewed the patient's chart and labs.  Questions were answered to the patient's satisfaction.     Danille Oppedisano C.

## 2012-11-07 NOTE — Progress Notes (Addendum)
Patient ID: Harold Price, male   DOB: 1930/01/03, 79 y.o.   MRN: [id number]  Nurse called reporting that patient had a dark red stool this afternoon. Will make NPO and do a RBC scan to try and localize bleeding source.

## 2012-11-07 NOTE — Brief Op Note (Signed)
NO active bleeding. Melena likely from initial bleed and not a recurrence. If it continues then may need a colonoscopy. Clears and advance. Follow H/Hs.

## 2012-11-08 ENCOUNTER — Encounter (HOSPITAL_COMMUNITY): Payer: Self-pay | Admitting: Gastroenterology

## 2012-11-08 LAB — CBC
HCT: 17.3 % — ABNORMAL LOW (ref 39.0–52.0)
HCT: 17.9 % — ABNORMAL LOW (ref 39.0–52.0)
HCT: 18.4 % — ABNORMAL LOW (ref 39.0–52.0)
Hemoglobin: 5.7 g/dL — CL (ref 13.0–17.0)
Hemoglobin: 5.8 g/dL — CL (ref 13.0–17.0)
Hemoglobin: 6 g/dL — CL (ref 13.0–17.0)
MCH: 27.6 pg (ref 26.0–34.0)
MCHC: 32.6 g/dL (ref 30.0–36.0)
MCV: 85.2 fL (ref 78.0–100.0)
MCV: 84.8 fL (ref 78.0–100.0)
RBC: 2.05 MIL/uL — ABNORMAL LOW (ref 4.22–5.81)
RBC: 2.1 MIL/uL — ABNORMAL LOW (ref 4.22–5.81)
RDW: 15.7 % — ABNORMAL HIGH (ref 11.5–15.5)
WBC: 6.8 10*3/uL (ref 4.0–10.5)
WBC: 5.8 10*3/uL (ref 4.0–10.5)

## 2012-11-08 MED ORDER — CALCIUM CARBONATE ANTACID 500 MG PO CHEW
1.0000 | CHEWABLE_TABLET | Freq: Four times a day (QID) | ORAL | Status: DC | PRN
  Administered 2012-11-08 (×2): 200 mg via ORAL
  Filled 2012-11-08 (×2): qty 1

## 2012-11-08 MED ORDER — PANTOPRAZOLE SODIUM 40 MG IV SOLR
40.0000 mg | Freq: Two times a day (BID) | INTRAVENOUS | Status: DC
  Administered 2012-11-08 – 2012-11-11 (×7): 40 mg via INTRAVENOUS
  Filled 2012-11-08 (×8): qty 40

## 2012-11-08 NOTE — Progress Notes (Signed)
[facility] PROGRESS NOTE  Harold Price WUX:[id number] DOB: [birth year] DOA: 11/06/2012 PCP: No primary provider on file. none  Assessment/Plan: 1. Acute upper GI bleed secondary to duodenal ulcer: Status post endoscopy 2/22 which revealed duodenal ulcer with stigmata of bleeding which was injected with epinephrine. Repeat endoscopy 2/23 unremarkable for acute issue. NM bleeding scan negative. Discussed with Dr. Bosie Clos. He will advance diet, decrease blood draws. Change to twice a day Protonix. IV Protonix infusion. 2. Acute blood loss anemia: Tolerating very well. patient refuses blood products. May be stabilizing. Monitor for bleeding. Patient clearly understands the risks associated with refusing blood products including disability and death both verbally and by signing document. 3. Generalized weakness: Secondary to acute blood loss. 4. Alcohol abuse: No signs of withdrawal. CIWA. Monitor for drop.  5. History of ulcer 6. Jehovah's Witness: No blood products.   Code Status: Full code  Family Communication: none present  Disposition Plan: Pending medical stability, likely home.   Brendia Sacks, MD  [facility] Team 7 Pager [phone] If 7PM-7AM, please contact night-coverage at [email or web address], password [facility] 11/08/2012, 8:53 AM  LOS: 2 days   Brief narrative: 79 year old man with history of peptic ulcer disease presented to emergency department with history of dark stools, coffee-ground emesis. Initial evaluation notable for anemia and patient admitted for presumed upper GI bleed.  He was seen in consultation with gastroenterology and underwent same-day upper endoscopy which revealed duodenal ulcer with bleeding stigmata which was injected with epinephrine. Hemoglobin is slowly trended down patient refuses blood products.  Consultants:  [facility]  Procedures:  2/22 EGD: Duodenal ulcer at genu with bleeding stigmata -s/p epi injection. Clean-based duodenal  bulb ulcer noted  2/23 EGD: Duodenal ulcers again noted - no bleeding stigmata and no blood products noted Mild duodenitis Suspect recent melena was due to previous bleed and not a recurrence  HPI/Subjective: No chest pain or shortness of breath. Feels well. Repeat EGD yesterday was reassuring. Perhaps a small amount of bleeding overnight. Hungry.  Objective: Filed Vitals:   11/08/12 0600 11/08/12 0700 11/08/12 0730 11/08/12 0800  BP:  110/74 110/74   Pulse: 88 91 77 88  Temp:   98.6 F (37 C)   TempSrc:   Oral   Resp: 15 22 13 14   Height:      Weight:      SpO2: 99% 95% 98% 99%    Intake/Output Summary (Last 24 hours) at 11/08/12 0853 Last data filed at 11/08/12 0600  Gross per 24 hour  Intake   2635 ml  Output      0 ml  Net   2635 ml   Filed Weights   11/06/12 0949  Weight: 90.719 kg (200 lb)    Exam:  General:  Appears calm and comfortable. Well-appearing. Cardiovascular: RRR, no m/r/g. No LE edema. Respiratory: CTA bilaterally, no w/r/r. Normal respiratory effort. Psychiatric: grossly normal mood and affect, speech fluent and appropriate  Exam current 2/24  Data Reviewed: Basic Metabolic Panel:  Recent Labs Lab 11/06/12 0429 11/07/12 0555  NA 138 138  K 4.9 3.9  CL 104 107  CO2 27 26  GLUCOSE 163* 126*  BUN 48* 25*  CREATININE 1.22 1.25  CALCIUM 9.0 8.4   Liver Function Tests:  Recent Labs Lab 11/06/12 0429  AST 14  ALT 5  ALKPHOS 31*  BILITOT 0.1*  PROT 5.0*  ALBUMIN 2.8*   CBC:  Recent Labs Lab 11/06/12 0429  11/07/12 0053 11/07/12 0555 11/07/12 1303 11/07/12 2312 11/08/12  0510  WBC 11.4*  < > 12.0* 10.8* 9.5 6.8 5.8  NEUTROABS 9.0*  --   --   --   --   --   --   HGB 7.8*  < > 6.4* 6.1* 6.4* 5.7* 5.8*  HCT 22.8*  < > 19.4* 17.9* 19.2* 17.3* 17.9*  MCV 83.5  < > 84.7 83.6 84.2 84.4 85.2  PLT 132*  < > 151 121* 131* 117* 115*  < > = values in this interval not displayed.   Recent Results (from the past 240 hour(s))  MRSA  PCR SCREENING     Status: None   Collection Time    11/06/12 10:09 AM      Result Value Range Status   MRSA by PCR NEGATIVE  NEGATIVE Final   Comment:            The GeneXpert MRSA Assay (FDA     approved for NASAL specimens     only), is one component of a     comprehensive MRSA colonization     surveillance program. It is not     intended to diagnose MRSA     infection nor to guide or     monitor treatment for     MRSA infections.     Studies: Nm Gi Blood Loss  11/07/2012  *RADIOLOGY REPORT*  Clinical Data: 79 year old male with GI bleed.  NUCLEAR MEDICINE GASTROINTESTINAL BLEEDING STUDY  Technique:  Sequential abdominal images were obtained following intravenous administration of Tc-59m labeled red blood cells.  Radiopharmaceutical: CURIE ULTRATAG TECHNETIUM TC 93M- LABELED RED BLOOD CELLS IV KIT  Comparison: None  Findings: There are no suspicious areas of radiotracer activity to suggest GI bleed. No abnormal fixed areas of activity are identified.  IMPRESSION: No scintigraphic evidence of GI bleed.   Original Report Authenticated By: Harmon Pier, M.D.     Scheduled Meds: . folic acid  1 mg Oral Daily  . influenza  inactive virus vaccine  0.5 mL Intramuscular Tomorrow-1000  . LORazepam  0-4 mg Intravenous Q6H   Followed by  . LORazepam  0-4 mg Intravenous Q12H  . multivitamin with minerals  1 tablet Oral Daily  . pantoprazole (PROTONIX) IV  40 mg Intravenous Q12H  . sodium chloride  3 mL Intravenous Q12H  . thiamine  100 mg Oral Daily   Or  . thiamine  100 mg Intravenous Daily   Continuous Infusions: . sodium chloride 75 mL/hr at 11/08/12 0600    Principal Problem:   Duodenal ulcer with hemorrhage Active Problems:   Acute blood loss anemia   Generalized weakness   Alcohol abuse   History of ulcer disease   Refusal of blood transfusions as patient is Jehovah's Witness   Acute upper GI bleed     Brendia Sacks, MD  [facility] Team 7 Pager  [phone] If 7PM-7AM, please contact night-coverage at [email or web address], password [facility] 11/08/2012, 8:53 AM  LOS: 2 days   Time spent: 20 minutes

## 2012-11-08 NOTE — Progress Notes (Addendum)
Patient ID: Harold Price, male   DOB: 28-Dec-1929, 79 y.o.   MRN: [id number] [facility] Progress Note  Quinterious Walraven 79 y.o. [birth year]   Subjective: Awake. Sitting up in bed. Nurse reports brown stool with small amount of blood in it overnight. Bleeding scan negative. Hgb 5.8. Wants to eat.  Objective: Vital signs in last 24 hours: Filed Vitals:   11/08/12 0730  BP: 110/74  Pulse: 77  Temp: 98.6 F (37 C)  Resp: 13    Physical Exam: Gen: alert, no acute distress Abd: soft, nontender, nondistended  Lab Results:  Recent Labs  11/06/12 0429 11/07/12 0555  NA 138 138  K 4.9 3.9  CL 104 107  CO2 27 26  GLUCOSE 163* 126*  BUN 48* 25*  CREATININE 1.22 1.25  CALCIUM 9.0 8.4    Recent Labs  11/06/12 0429  AST 14  ALT 5  ALKPHOS 31*  BILITOT 0.1*  PROT 5.0*  ALBUMIN 2.8*    Recent Labs  11/06/12 0429  11/07/12 2312 11/08/12 0510  WBC 11.4*  < > 6.8 5.8  NEUTROABS 9.0*  --   --   --   HGB 7.8*  < > 5.7* 5.8*  HCT 22.8*  < > 17.3* 17.9*  MCV 83.5  < > 84.4 85.2  PLT 132*  < > 117* 115*  < > = values in this interval not displayed.  Recent Labs  11/06/12 0429  LABPROT 13.7  INR 1.06      Assessment/Plan: 82 s/p GI bleed due to a duodenal ulcer that no longer had bleeding stigmata on second endoscopy. Bleeding scan negative. Hgb 5.8. Change H/Hs to Q 12 hours. Will change to IV Protonix Q 12 hours today. Will start full liquids this morning and advance to soft diet today and if ok can advance to heart healthy tomorrow. Will hold off on colonoscopy since bleeding has stopped.   Skylur Fuston C. 11/08/2012, 8:28 AM

## 2012-11-08 NOTE — Clinical Social Work Note (Signed)
Clinical Social Worker deferred consult for advance directive to Chaplain department who will assist with request.   Rozetta Nunnery MSW, [employer] [phone]

## 2012-11-09 DIAGNOSIS — R5381 Other malaise: Secondary | ICD-10-CM

## 2012-11-09 LAB — CBC
MCH: 28 pg (ref 26.0–34.0)
MCHC: 33.7 g/dL (ref 30.0–36.0)
MCV: 83.3 fL (ref 78.0–100.0)
Platelets: 151 10*3/uL (ref 150–400)

## 2012-11-09 NOTE — Progress Notes (Signed)
Patient ID: Harold Price, male   DOB: 04/27/1930, 79 y.o.   MRN: [id number] [facility] Progress Note  Harold Price 79 y.o. 08/15/1930   Subjective: Sitting in chair. No complaints. Tolerating diet. Small black stool recorded at 0400 this morning but pt does not recall seeing color. Increase in Hgb to 6.7 (without blood products since refusing due to Jehovah's witness).  Objective: Vital signs in last 24 hours: Filed Vitals:   11/09/12 0900  BP: 112/39  Pulse: 79  Temp: 98  Resp: 18    Physical Exam: Gen: alert, no acute distress Abd: soft, nontender, nondistended  Lab Results:  Recent Labs  11/07/12 0555  NA 138  K 3.9  CL 107  CO2 26  GLUCOSE 126*  BUN 25*  CREATININE 1.25  CALCIUM 8.4   No results found for this basename: AST, ALT, ALKPHOS, BILITOT, PROT, ALBUMIN,  in the last 72 hours  Recent Labs  11/08/12 1626 11/09/12 0807  WBC 7.3 7.1  HGB 6.0* 6.7*  HCT 18.4* 19.9*  MCV 84.8 83.3  PLT 140* 151   No results found for this basename: LABPROT, INR,  in the last 72 hours    Assessment/Plan: S/P GI bleed - Improvement in Hgb. No evidence of active bleeding. Change IV PPI to PO BID at discharge. If Hgb remains stable for next 24 hours then may be stable for discharge. Start iron pills as outpt if patient willing. GI f/u in 3-4 weeks. Advised patient to stop drinking alcohol.   Jiyah Torpey C. 11/09/2012, 10:05 AM

## 2012-11-09 NOTE — Progress Notes (Signed)
PT Cancellation Note  Patient Details Name: Harold Price MRN: [id number] DOB: 07-08-1930   Cancelled Treatment:    Reason Eval/Treat Not Completed: Medical issues which prohibited therapy (Hgb 6.7 with noted increasing trend despite lack of blood products will hold til next date and assess stability for mobility )   Delaney Meigs, PT [phone]

## 2012-11-09 NOTE — Progress Notes (Addendum)
[facility] PROGRESS NOTE  Harold Price OAC:[id number] DOB: 1930/01/15 DOA: 11/06/2012 PCP: No primary provider on file. none  Brief narrative: 79 year old man with history of peptic ulcer disease presented to emergency department with history of dark stools, coffee-ground emesis. Initial evaluation notable for anemia and patient admitted for presumed upper GI bleed.  He was seen in consultation with gastroenterology and underwent same-day upper endoscopy which revealed duodenal ulcer with bleeding stigmata which was injected with epinephrine. Hemoglobin trended down further, but was not transfused blood as patient is a Jehovah's Witness who refuses blood products. He underwent repeat endoscopy 2/23 which revealed stable ulcer without bleeding therefore nuclear medicine scan obtained which was negative. Hemoglobin subsequently stabilized and patient tolerated profound anemia well. Plan is discharge on PPI therapy and outpatient gastroenterology followup in the next few days if remains stable.  Assessment/Plan: 1. Acute upper GI bleed secondary to duodenal ulcer: Hemoglobin stabilizing, no further bleeding. Status post endoscopy 2/22 which revealed duodenal ulcer with stigmata of bleeding which was injected with epinephrine. Repeat endoscopy 2/23 unremarkable for acute issue. NM bleeding scan negative.  2. Acute blood loss anemia: Tolerating very well. patient refuses blood products. Appears stable. Will hold on further blood draws unless evidence of further bleeding given stability. Patient clearly understands the risks associated with refusing blood products including disability and death both verbally and by signing document. 3. Generalized weakness: Secondary to acute blood loss. Physical therapy consultation. 4. Alcohol abuse: No signs of withdrawal. CIWA. Social work Administrator, sports. Recommend cessation. 5. History of ulcer 6. Jehovah's Witness: No blood products.  Code Status: Full code   Family Communication: none present  Disposition Plan: Pending medical stability, likely home.   Brendia Sacks, MD  [facility] Team 7 Pager [phone] If 7PM-7AM, please contact night-coverage at [email or web address], password [facility] 11/09/2012, 9:07 AM  LOS: 3 days    Consultants:  [facility]  Procedures:  2/22 EGD: Duodenal ulcer at genu with bleeding stigmata -s/p epi injection. Clean-based duodenal bulb ulcer noted  2/23 EGD: Duodenal ulcers again noted - no bleeding stigmata and no blood products noted Mild duodenitis Suspect recent melena was due to previous bleed and not a recurrence  HPI/Subjective: Feels okay. Eating well. No further bleeding that he is aware of. No nausea or vomiting Breathing okay discussed with RN--no concerns. No bleeding overnight..  Objective: Filed Vitals:   11/09/12 0500 11/09/12 0600 11/09/12 0700 11/09/12 0800  BP: 96/48 114/45 109/39 132/55  Pulse: 71 70 64 93  Temp:   98 F (36.7 C)   TempSrc:   Oral   Resp: 15 20 16 22   Height:      Weight:      SpO2: 100% 100% 99% 100%    Intake/Output Summary (Last 24 hours) at 11/09/12 0907 Last data filed at 11/09/12 0900  Gross per 24 hour  Intake 813.42 ml  Output    700 ml  Net 113.42 ml   Filed Weights   11/06/12 0949 11/09/12 0400  Weight: 90.719 kg (200 lb) 112.1 kg (247 lb 2.2 oz)    Exam:  General:  Appears calm and comfortable. Well-appearing. Cardiovascular: RRR, no m/r/g. No  significant LE edema. Respiratory: CTA bilaterally, no w/r/r. Normal respiratory effort. Psychiatric: grossly normal mood and affect, speech fluent and appropriate  Exam current 2/25  Data Reviewed: Basic Metabolic Panel:  Recent Labs Lab 11/06/12 0429 11/07/12 0555  NA 138 138  K 4.9 3.9  CL 104 107  CO2 27 26  GLUCOSE  163* 126*  BUN 48* 25*  CREATININE 1.22 1.25  CALCIUM 9.0 8.4   Liver Function Tests:  Recent Labs Lab 11/06/12 0429  AST 14  ALT 5  ALKPHOS 31*   BILITOT 0.1*  PROT 5.0*  ALBUMIN 2.8*   CBC:  Recent Labs Lab 11/06/12 0429  11/07/12 1303 11/07/12 2312 11/08/12 0510 11/08/12 1626 11/09/12 0807  WBC 11.4*  < > 9.5 6.8 5.8 7.3 7.1  NEUTROABS 9.0*  --   --   --   --   --   --   HGB 7.8*  < > 6.4* 5.7* 5.8* 6.0* 6.7*  HCT 22.8*  < > 19.2* 17.3* 17.9* 18.4* 19.9*  MCV 83.5  < > 84.2 84.4 85.2 84.8 83.3  PLT 132*  < > 131* 117* 115* 140* 151  < > = values in this interval not displayed.   Recent Results (from the past 240 hour(s))  MRSA PCR SCREENING     Status: None   Collection Time    11/06/12 10:09 AM      Result Value Range Status   MRSA by PCR NEGATIVE  NEGATIVE Final   Comment:            The GeneXpert MRSA Assay (FDA     approved for NASAL specimens     only), is one component of a     comprehensive MRSA colonization     surveillance program. It is not     intended to diagnose MRSA     infection nor to guide or     monitor treatment for     MRSA infections.     Studies: Nm Gi Blood Loss  11/07/2012  *RADIOLOGY REPORT*  Clinical Data: 79 year old male with GI bleed.  NUCLEAR MEDICINE GASTROINTESTINAL BLEEDING STUDY  Technique:  Sequential abdominal images were obtained following intravenous administration of Tc-53m labeled red blood cells.  Radiopharmaceutical: CURIE ULTRATAG TECHNETIUM TC 82M- LABELED RED BLOOD CELLS IV KIT  Comparison: None  Findings: There are no suspicious areas of radiotracer activity to suggest GI bleed. No abnormal fixed areas of activity are identified.  IMPRESSION: No scintigraphic evidence of GI bleed.   Original Report Authenticated By: Harmon Pier, M.D.     Scheduled Meds: . folic acid  1 mg Oral Daily  . LORazepam  0-4 mg Intravenous Q12H  . multivitamin with minerals  1 tablet Oral Daily  . pantoprazole (PROTONIX) IV  40 mg Intravenous Q12H  . sodium chloride  3 mL Intravenous Q12H  . thiamine  100 mg Oral Daily   Or  . thiamine  100 mg Intravenous Daily    Continuous Infusions:    Principal Problem:   Duodenal ulcer with hemorrhage Active Problems:   Acute blood loss anemia   Generalized weakness   Alcohol abuse   History of ulcer disease   Refusal of blood transfusions as patient is Jehovah's Witness   Acute upper GI bleed     Brendia Sacks, MD  [facility] Team 7 Pager [phone] If 7PM-7AM, please contact night-coverage at [email or web address], password [facility] 11/09/2012, 9:07 AM  LOS: 3 days   Time spent: 15 minutes

## 2012-11-10 NOTE — Progress Notes (Signed)
Transferred to [id number] by wheelchair stable, belongings with pt. Report given to RN.

## 2012-11-10 NOTE — Progress Notes (Signed)
Patient ID: Harold Price, male   DOB: 12-26-1929, 79 y.o.   MRN: [id number] [facility]enterology Progress Note  Harold Price 79 y.o. 1930/05/30   Subjective: No further bleeding. Eating solid food. Feels good. Hgb 6.7 yesterday.  Objective: Vital signs: Filed Vitals:   11/10/12 1156  BP: 122/43  Pulse: 71  Temp: 97.8 F (36.6 C)  Resp: 17    Physical Exam: Gen: alert, no acute distress  Abd: soft, nontender  Lab Results: No results found for this basename: NA, K, CL, CO2, GLUCOSE, BUN, CREATININE, CALCIUM, MG, PHOS,  in the last 72 hours No results found for this basename: AST, ALT, ALKPHOS, BILITOT, PROT, ALBUMIN,  in the last 72 hours  Recent Labs  11/08/12 1626 11/09/12 0807  WBC 7.3 7.1  HGB 6.0* 6.7*  HCT 18.4* 19.9*  MCV 84.8 83.3  PLT 140* 151      Assessment/Plan: GI bleed (duodenal ulcer) that has resolved. No further GI recs. Will sign off. Call if questions. F/U with me will be arranged for 3-4 weeks.   Celinda Dethlefs C. 11/10/2012, 1:48 PM

## 2012-11-10 NOTE — Evaluation (Signed)
Physical Therapy Evaluation Patient Details Name: Harold Price MRN: [id number] DOB: 1929/12/16 Today's Date: 11/10/2012 Time: 2130-8657 PT Time Calculation (min): 22 min  PT Assessment / Plan / Recommendation Clinical Impression  79 y/o male admit for anemia in the setting of GI bleed. No blood products as pt Jehovah's Witness. Hgb yesterday 6.7. Per RN they are not drawing blood today so do not have a reading for today. PTA pt independent with gait and ADLs. Presents to PT today with decline in functional independence secondary to below impairments. Will benefit physical therapy in the acute setting to maximize safety for d/c home.     PT Assessment  Patient needs continued PT services    Follow Up Recommendations  Home health PT (vs no PT f/u pending progress)    Does the patient have the potential to tolerate intense rehabilitation      Barriers to Discharge        Equipment Recommendations   (tbd next visit)    Recommendations for Other Services     Frequency Min 3X/week    Precautions / Restrictions Precautions Precautions: Fall Precaution Comments: low hbg, gets dizzy in standing (3-4/10) Restrictions Weight Bearing Restrictions: No   Pertinent Vitals/Pain Vitals stable      Mobility  Bed Mobility Bed Mobility: Not assessed Transfers Transfers: Sit to Stand;Stand to Sit Sit to Stand: 5: Supervision;From chair/3-in-1;With upper extremity assist Stand to Sit: 5: Supervision;To chair/3-in-1;With upper extremity assist Details for Transfer Assistance: superivison due to patient reports of dizziness when standing and hgb 6.7 yesterday, no LOB and pt used RW to steady himself in standing Ambulation/Gait Ambulation/Gait Assistance: 4: Min guard Ambulation Distance (Feet): 180 Feet Assistive device: Rolling walker Ambulation/Gait Assistance Details: cues to slow down and control breathing rate, cues for safety  General Gait Details: pt very quick/impulsive with  ambulation, RR increases quickly into the 40s but with cueing to slow down and focus it stays low 20s; pt with decreased insight into how his low hgb and generalized weakness might affect his safety with gait Stairs: No         PT Diagnosis: Difficulty walking;Abnormality of gait;Generalized weakness  PT Problem List: Decreased activity tolerance;Decreased safety awareness;Decreased knowledge of use of DME;Decreased mobility PT Treatment Interventions: DME instruction;Gait training;Stair training;Functional mobility training;Therapeutic activities;Therapeutic exercise;Balance training;Neuromuscular re-education;Patient/family education   PT Goals Acute Rehab PT Goals PT Goal Formulation: With patient Time For Goal Achievement: 11/17/12 Potential to Achieve Goals: Good Pt will go Sit to Stand: Independently PT Goal: Sit to Stand - Progress: Goal set today Pt will go Stand to Sit: Independently PT Goal: Stand to Sit - Progress: Goal set today Pt will Ambulate: >150 feet;with modified independence;with least restrictive assistive device PT Goal: Ambulate - Progress: Goal set today Pt will Go Up / Down Stairs: 1-2 stairs;with modified independence PT Goal: Up/Down Stairs - Progress: Goal set today  Visit Information  Last PT Received On: 11/10/12 Assistance Needed: +1    Subjective Data  Subjective: I get dizzy when I stand up. Patient Stated Goal: home   Prior Functioning  Home Living Lives With: Spouse Available Help at Discharge: Family;Available PRN/intermittently Type of Home: House Home Access: Stairs to enter [employer] of Steps: 2 Entrance Stairs-Rails: Right Home Layout: One level Bathroom Shower/Tub: Engineer, manufacturing systems: Handicapped height Home Adaptive Equipment: Shower chair with back Additional Comments: he is responsible for cooking and cleaning at home because wife can't do it, his granddaughters have been helping his wife since  he was  admitted, they can come intermittently and help with cleaning and cooking Prior Function Level of Independence: Independent Able to Take Stairs?: Yes Driving: Yes 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 difficulties    Copywriter, advertising Overall Cognitive Status: Impaired Area of Impairment: Safety/judgement Arousal/Alertness: Awake/alert Behavior During Session: WFL for tasks performed Safety/Judgement: Impulsive    Extremity/Trunk Assessment Right Upper Extremity Assessment RUE ROM/Strength/Tone: WFL for tasks assessed Left Upper Extremity Assessment LUE ROM/Strength/Tone: WFL for tasks assessed Right Lower Extremity Assessment RLE ROM/Strength/Tone: WFL for tasks assessed Left Lower Extremity Assessment LLE ROM/Strength/Tone: WFL for tasks assessed Trunk Assessment Trunk Assessment: Normal   Balance Balance Balance Assessed: Yes Static Standing Balance Static Standing - Balance Support: No upper extremity supported Static Standing - Level of Assistance: 5: Stand by assistance  End of Session PT - End of Session Equipment Utilized During Treatment: Gait belt Activity Tolerance: Patient tolerated treatment well Patient left: in chair Nurse Communication: Mobility status  GP     [facility] HELEN 11/10/2012, 1:27 PM

## 2012-11-10 NOTE — Progress Notes (Signed)
[facility] PROGRESS NOTE  Harold Price ZOX:[id number] DOB: Mar 20, 1930 DOA: 11/06/2012 PCP: No primary provider on file. none  Brief narrative: 79 year old man with history of peptic ulcer disease presented to emergency department with history of dark stools, coffee-ground emesis. Initial evaluation notable for anemia and patient admitted for presumed upper GI bleed.  He was seen in consultation with gastroenterology and underwent same-day upper endoscopy which revealed duodenal ulcer with bleeding stigmata which was injected with epinephrine. Hemoglobin trended down further, but was not transfused blood as patient is a Jehovah's Witness who refuses blood products. He underwent repeat endoscopy 2/23 which revealed stable ulcer without bleeding therefore nuclear medicine scan obtained which was negative. Hemoglobin subsequently stabilized and patient tolerated profound anemia well. Plan is discharge on PPI therapy and outpatient gastroenterology followup in the next few days if remains stable.  Assessment/Plan: 1. Acute upper GI bleed secondary to duodenal ulcer: Hemoglobin stabilizing, no further bleeding. Status post endoscopy 2/22 which revealed duodenal ulcer with stigmata of bleeding which was injected with epinephrine. Repeat endoscopy 2/23 unremarkable for acute issue. NM bleeding scan negative. No further bleeding episodes per nursing staff this morning.  2. Acute blood loss anemia: Tolerating very well. patient refuses blood products. Appears stable. Will hold on further blood draws unless evidence of further bleeding given stability. Patient clearly understands the risks associated with refusing blood products including disability and death both verbally and by signing document. Will check a CBC tomorrow morning.  3. Generalized weakness: Secondary to acute blood loss. Physical therapy consultation. 4. Alcohol abuse: No signs of withdrawal. CIWA. Social work Administrator, sports. Recommend  cessation. 5. History of ulcer 6. Jehovah's Witness: No blood products.  Code Status: Full code  Family Communication: none present  Disposition Plan: Floor today, home tomorrow if stable.   Consultants:  [facility]  Procedures:  2/22 EGD: Duodenal ulcer at genu with bleeding stigmata -s/p epi injection. Clean-based duodenal bulb ulcer noted  2/23 EGD: Duodenal ulcers again noted - no bleeding stigmata and no blood products noted Mild duodenitis Suspect recent melena was due to previous bleed and not a recurrence  HPI/Subjective: Feels okay. Eating well. No further bleeding that he is aware of. No nausea or vomiting Breathing okay discussed with RN--no concerns. No bleeding overnight..  Objective: Filed Vitals:   11/10/12 0004 11/10/12 0300 11/10/12 0328 11/10/12 0833  BP: 122/58  136/67 131/88  Pulse: 79  87 93  Temp:  98 F (36.7 C)  98 F (36.7 C)  TempSrc:  Oral  Oral  Resp:   19 22  Height:      Weight:      SpO2:   99% 98%    Intake/Output Summary (Last 24 hours) at 11/10/12 1100 Last data filed at 11/10/12 0900  Gross per 24 hour  Intake    670 ml  Output    203 ml  Net    467 ml   Filed Weights   11/06/12 0949 11/09/12 0400  Weight: 90.719 kg (200 lb) 112.1 kg (247 lb 2.2 oz)    Exam:  General:  Appears calm and comfortable. Well-appearing. Cardiovascular: RRR, no m/r/g. No  significant LE edema. Respiratory: CTA bilaterally, no w/r/r. Normal respiratory effort. Psychiatric: grossly normal mood and affect, speech fluent and appropriate  Exam current 2/26  Data Reviewed: Basic Metabolic Panel:  Recent Labs Lab 11/06/12 0429 11/07/12 0555  NA 138 138  K 4.9 3.9  CL 104 107  CO2 27 26  GLUCOSE 163* 126*  BUN 48* 25*  CREATININE 1.22 1.25  CALCIUM 9.0 8.4   Liver Function Tests:  Recent Labs Lab 11/06/12 0429  AST 14  ALT 5  ALKPHOS 31*  BILITOT 0.1*  PROT 5.0*  ALBUMIN 2.8*   CBC:  Recent Labs Lab 11/06/12 0429   11/07/12 1303 11/07/12 2312 11/08/12 0510 11/08/12 1626 11/09/12 0807  WBC 11.4*  < > 9.5 6.8 5.8 7.3 7.1  NEUTROABS 9.0*  --   --   --   --   --   --   HGB 7.8*  < > 6.4* 5.7* 5.8* 6.0* 6.7*  HCT 22.8*  < > 19.2* 17.3* 17.9* 18.4* 19.9*  MCV 83.5  < > 84.2 84.4 85.2 84.8 83.3  PLT 132*  < > 131* 117* 115* 140* 151  < > = values in this interval not displayed.   Recent Results (from the past 240 hour(s))  MRSA PCR SCREENING     Status: None   Collection Time    11/06/12 10:09 AM      Result Value Range Status   MRSA by PCR NEGATIVE  NEGATIVE Final   Comment:            The GeneXpert MRSA Assay (FDA     approved for NASAL specimens     only), is one component of a     comprehensive MRSA colonization     surveillance program. It is not     intended to diagnose MRSA     infection nor to guide or     monitor treatment for     MRSA infections.     Studies: No results found.  Scheduled Meds: . folic acid  1 mg Oral Daily  . LORazepam  0-4 mg Intravenous Q12H  . multivitamin with minerals  1 tablet Oral Daily  . pantoprazole (PROTONIX) IV  40 mg Intravenous Q12H  . sodium chloride  3 mL Intravenous Q12H  . thiamine  100 mg Oral Daily   Or  . thiamine  100 mg Intravenous Daily   Continuous Infusions:    Principal Problem:   Duodenal ulcer with hemorrhage Active Problems:   Acute blood loss anemia   Generalized weakness   Alcohol abuse   History of ulcer disease   Refusal of blood transfusions as patient is Jehovah's Witness   Acute upper GI bleed    Shirin Echeverry M. Elvera Lennox, MD [facility] [phone] If 7PM-7AM, please contact night-coverage at [email or web address], password [facility] 11/10/2012, 11:00 AM  LOS: 4 days   Time spent: 15 minutes

## 2012-11-10 NOTE — Progress Notes (Signed)
Patient transferred from 2900. Oriented to the room.Skin intact. Telemetry placed.

## 2012-11-11 LAB — CBC
HCT: 19.6 % — ABNORMAL LOW (ref 39.0–52.0)
MCHC: 33.2 g/dL (ref 30.0–36.0)
Platelets: 172 10*3/uL (ref 150–400)
RDW: 15.8 % — ABNORMAL HIGH (ref 11.5–15.5)
WBC: 7.1 10*3/uL (ref 4.0–10.5)

## 2012-11-11 MED ORDER — FOLIC ACID 1 MG PO TABS: 1.0000 mg | ORAL_TABLET | Freq: Every day | ORAL | Status: DC

## 2012-11-11 MED ORDER — FERROUS SULFATE 325 (65 FE) MG PO TABS: 325.0000 mg | ORAL_TABLET | Freq: Every day | ORAL | Status: DC

## 2012-11-11 MED ORDER — PANTOPRAZOLE SODIUM 40 MG PO TBEC: 40.0000 mg | DELAYED_RELEASE_TABLET | Freq: Every day | ORAL | Status: DC

## 2012-11-11 NOTE — Progress Notes (Signed)
Blythe Stanford to be D/C'd Home per MD order.  Discussed with the patient and all questions fully answered.    Medication List    STOP taking these medications       aspirin 325 MG tablet      TAKE these medications       calcium carbonate 500 MG chewable tablet  Commonly known as:  TUMS - dosed in mg elemental calcium  Chew 1 tablet by mouth as needed for heartburn.     ferrous sulfate 325 (65 FE) MG tablet  Commonly known as:  FERROUSUL  Take 1 tablet (325 mg total) by mouth daily with breakfast.     folic acid 1 MG tablet  Commonly known as:  FOLVITE  Take 1 tablet (1 mg total) by mouth daily.     multivitamin with minerals Tabs  Take 1 tablet by mouth daily.     pantoprazole 40 MG tablet  Commonly known as:  PROTONIX  Take 1 tablet (40 mg total) by mouth daily.        VVS, Skin clean, dry and intact without evidence of skin break down, no evidence of skin tears noted. IV catheter discontinued intact. Site without signs and symptoms of complications. Dressing and pressure applied.  An After Visit Summary was printed and given to the patient. Follow up appointments , new prescriptions and medication administration times given. Printout education on stomach ulcers given. Patient escorted via WC, and D/C home via private auto.  Cindra Eves, RN 11/11/2012 2:06 PM

## 2012-11-11 NOTE — Clinical Social Work Psychosocial (Signed)
     Clinical Social Work Department BRIEF PSYCHOSOCIAL ASSESSMENT 11/11/2012  Patient:  Harold Price,Harold Price     Account Number:  [id number]     Admit date:  11/06/2012  Clinical Social Worker:  Hulan Fray  Date/Time:  11/11/2012 10:02 AM  Referred by:  Physician  Date Referred:  11/09/2012 Referred for  Substance Abuse   Other Referral:   Interview type:  Patient Other interview type:    PSYCHOSOCIAL DATA Living Status:  WIFE Admitted from facility:   Level of care:   Primary support name:  Moss Mc Primary support relationship to patient:  SPOUSE Degree of support available:   Supportive    CURRENT CONCERNS Current Concerns  Substance Abuse   Other Concerns:    SOCIAL WORK ASSESSMENT / PLAN Clinical Social Worker received referral for substance abuse counseling for patient. CSW introduced self and explained reason for visit. Patient was sitting up in chair during assessment. Patient was agreeable to discuss substance abuse referral.    Patient scored a 20, based on his responses to the substance abuse assessment tool, he is high risk. Patient reported that he felt he had a "small problem with drinking." Patient reported that he sometimes drinks a 6-pack a day, but "not everyday, sometimes it's less then that." Patient reported that he has had previous treatment, first in 1985 for a DWI and he had to participate in a inpatient 6 week program, but patient could not remember the name of that program. Patient also reported a recent program, that was 6 weeks also, he participated in, in 2006 that was in [place] in order to "get my license back." Patient reported that he could not remember the name of that program either. CSW offered resource of substance abuse treamtent inpatient and outpatient programs, but patient declined offer. Patient reported that he "learned a lot from the two programs I went to." CSW will sign off, as social work intervention is no longer  needed.   Assessment/plan status:  No Further Intervention Required Other assessment/ plan:   Information/referral to community resources:   Patient declined resources for inpatient/outpatient treatment facilities.    PATIENTS/FAMILYS RESPONSE TO PLAN OF CARE: Patient was agreeable to participate in assessment for substance abuse counseling, but declined resources.

## 2012-11-11 NOTE — Progress Notes (Signed)
3Physical Therapy Treatment Patient Details Name: Harold Price MRN: [id number] DOB: 14-Dec-1929 Today's Date: 11/11/2012 Time: 4098-1191 PT Time Calculation (min): 15 min  PT Assessment / Plan / Recommendation Comments on Treatment Session  Pt. is improving in mobility and ambulation in spite of low Hgb. Encouraged pt to call for assistance when mobilizing.     Follow Up Recommendations  Home health PT     Does the patient have the potential to tolerate intense rehabilitation     Barriers to Discharge        Equipment Recommendations  Rolling walker with 5" wheels    Recommendations for Other Services    Frequency Min 3X/week   Plan Discharge plan remains appropriate    Precautions / Restrictions Precautions Precautions: Fall Precaution Comments: low hbg, gets dizzy in standing  Restrictions Weight Bearing Restrictions: No   Pertinent Vitals/Pain After ambulation117/42,100% 84 HR    Mobility  Bed Mobility Bed Mobility: Not assessed Transfers Sit to Stand: 5: Supervision;From toilet Stand to Sit: 6: Modified independent (Device/Increase time);To chair/3-in-1;With armrests Details for Transfer Assistance: cues to stand briefly before walking if dizzy Ambulation/Gait Ambulation/Gait Assistance: 5: Supervision Ambulation Distance (Feet): 180 Feet Assistive device: Rolling walker Ambulation/Gait Assistance Details: cues to slow speed and turns General Gait Details: pt able to ambulate in room w/ no AD with holding to objects.    Exercises     PT Diagnosis:    PT Problem List:   PT Treatment Interventions:     PT Goals Acute Rehab PT Goals Pt will go Sit to Stand: Independently PT Goal: Sit to Stand - Progress: Progressing toward goal Pt will go Stand to Sit: Independently PT Goal: Stand to Sit - Progress: Progressing toward goal Pt will Ambulate: >150 feet;with modified independence;with least restrictive assistive device PT Goal: Ambulate - Progress: Progressing  toward goal  Visit Information  Last PT Received On: 11/11/12 Assistance Needed: +1    Subjective Data  Subjective: I am ok, a little dizzy   Cognition  Cognition Overall Cognitive Status: Impaired Area of Impairment: Safety/judgement Safety/Judgement: Impulsive    Balance  Balance Balance Assessed: Yes Dynamic Standing Balance Dynamic Standing - Level of Assistance: 5: Stand by assistance (noted while in BR performing hygiene.)  End of Session PT - End of Session Equipment Utilized During Treatment: Gait belt Activity Tolerance: Patient tolerated treatment well Patient left: in chair Nurse Communication: Mobility status   GP     Harold Price 11/11/2012, 9:53 AM [phone]

## 2012-11-11 NOTE — Discharge Summary (Signed)
Physician Discharge Summary  Harold Price ZOX:[id number] DOB: Feb 05, 1930 DOA: 11/06/2012  Admit date: 11/06/2012 Discharge date: 11/11/2012  Time spent: 35 minutes  Recommendations for Outpatient Follow-up:  1. Follow up with your PCP in 1 week   Discharge Diagnoses:  Principal Problem:   Duodenal ulcer with hemorrhage Active Problems:   Acute blood loss anemia   Generalized weakness   Alcohol abuse   History of ulcer disease   Refusal of blood transfusions as patient is Jehovah's Witness   Acute upper GI bleed  Discharge Condition: stable  Diet recommendation: heart healthy  Filed Weights   11/06/12 0949 11/09/12 0400  Weight: 90.719 kg (200 lb) 112.1 kg (247 lb 2.2 oz)    History of present illness:  79 year old man with history of peptic ulcer disease presented to emergency department with history of dark stools, coffee-ground emesis. Initial evaluation notable for anemia and patient admitted for presumed upper GI bleed.  He was seen in consultation with gastroenterology and underwent same-day upper endoscopy which revealed duodenal ulcer with bleeding stigmata which was injected with epinephrine. Hemoglobin trended down further, but was not transfused blood as patient is a Jehovah's Witness who refuses blood products. He underwent repeat endoscopy 2/23 which revealed stable ulcer without bleeding therefore nuclear medicine scan obtained which was negative. Hemoglobin subsequently stabilized and patient tolerated profound anemia well. Plan is discharge on PPI therapy and outpatient gastroenterology followup in the next few days if remains stable.   Hospital Course:   1. Acute upper GI bleed secondary to duodenal ulcer: Gastroenterology was consulted given his anemia and clinical presentation suspicious for an upper gi bleed. He underwent an EGD on 2/22 which showed a duodenal ulcer s/p epi injection and a repeat EGD on 2/23 which showed no further bleeding. 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 Patient clearly understands the risks associated with refusing blood products including disability and death both verbally and by signing document. No further episodes of bleeding per patient and per nursing staff. His Hb on discharge is decreased at 6.5 however it is unchanged in the past 48 hours. He would benefit from a blood transfusion at this point however patient declines. Given clinical stability and no evidence of further bleeding he will be discharged with advice to follow up closely with his PCP. Counseled to avoid alcohol.   2. Generalized weakness: Secondary to acute blood loss. Physical therapy consultation and recommended home health upon discharge. Will arrange that.  3. Alcohol abuse: No signs of withdrawal. Recommend cessation.  Procedures: 2/22 EGD: Duodenal ulcer at genu with bleeding stigmata -s/p epi injection. Clean-based duodenal bulb ulcer noted 2/23 EGD: Duodenal ulcers again noted - no bleeding stigmata and no blood products noted Mild duodenitis Suspect recent melena was due to previous bleed and not a recurrence   Consultations:  Gastroenterology  Discharge Exam: Filed Vitals:   11/10/12 1600 11/10/12 1842 11/10/12 2057 11/11/12 0631  BP: 112/40 151/66 121/55 116/60  Pulse: 78 83 78 66  Temp: 97.6 F (36.4 C) 97.7 F (36.5 C) 98 F (36.7 C) 98 F (36.7 C)  TempSrc: Oral Oral Oral Oral  Resp: 25 20 20 20   Height:      Weight:      SpO2: 96% 97% 98% 95%    General: NAD Cardiovascular: RRR without MRG Respiratory: CTA biL, no wheezing/rales  Discharge Instructions    Medication List    STOP taking these medications       aspirin 325 MG  tablet      TAKE these medications       calcium carbonate 500 MG chewable tablet  Commonly known as:  TUMS - dosed in mg elemental calcium  Chew 1 tablet by mouth as needed for heartburn.     ferrous sulfate 325 (65 FE) MG tablet  Commonly known as:  FERROUSUL  Take 1 tablet (325  mg total) by mouth daily with breakfast.     folic acid 1 MG tablet  Commonly known as:  FOLVITE  Take 1 tablet (1 mg total) by mouth daily.     multivitamin with minerals Tabs  Take 1 tablet by mouth daily.     pantoprazole 40 MG tablet  Commonly known as:  PROTONIX  Take 1 tablet (40 mg total) by mouth daily.        The results of significant diagnostics from this hospitalization (including imaging, microbiology, ancillary and laboratory) are listed below for reference.    Significant Diagnostic Studies: Nm Gi Blood Loss  11/07/2012  *RADIOLOGY REPORT*  Clinical Data: 79 year old male with GI bleed.  NUCLEAR MEDICINE GASTROINTESTINAL BLEEDING STUDY  Technique:  Sequential abdominal images were obtained following intravenous administration of Tc-60m labeled red blood cells.  Radiopharmaceutical: CURIE ULTRATAG TECHNETIUM TC 58M- LABELED RED BLOOD CELLS IV KIT  Comparison: None  Findings: There are no suspicious areas of radiotracer activity to suggest GI bleed. No abnormal fixed areas of activity are identified.  IMPRESSION: No scintigraphic evidence of GI bleed.   Original Report Authenticated By: Harmon Pier, M.D.    Dg Abd Acute W/chest  11/06/2012  *RADIOLOGY REPORT*  Clinical Data: Nausea and vomiting.  ACUTE ABDOMEN SERIES (ABDOMEN 2 VIEW & CHEST 1 VIEW)  Comparison: 06/15/2006  Findings: No evidence for free air on the decubitus image. Nonspecific bowel gas pattern.  Degenerative changes in the spine. Lungs are clear without focal disease. Heart and mediastinum are within normal limits.  IMPRESSION: No acute findings.   Original Report Authenticated By: Richarda Overlie, M.D.     Microbiology: Recent Results (from the past 240 hour(s))  MRSA PCR SCREENING     Status: None   Collection Time    11/06/12 10:09 AM      Result Value Range Status   MRSA by PCR NEGATIVE  NEGATIVE Final   Comment:            The GeneXpert MRSA Assay (FDA     approved for NASAL specimens     only),  is one component of a     comprehensive MRSA colonization     surveillance program. It is not     intended to diagnose MRSA     infection nor to guide or     monitor treatment for     MRSA infections.    Labs: Basic Metabolic Panel:  Recent Labs Lab 11/06/12 0429 11/07/12 0555  NA 138 138  K 4.9 3.9  CL 104 107  CO2 27 26  GLUCOSE 163* 126*  BUN 48* 25*  CREATININE 1.22 1.25  CALCIUM 9.0 8.4   Liver Function Tests:  Recent Labs Lab 11/06/12 0429  AST 14  ALT 5  ALKPHOS 31*  BILITOT 0.1*  PROT 5.0*  ALBUMIN 2.8*   CBC:  Recent Labs Lab 11/06/12 0429  11/07/12 2312 11/08/12 0510 11/08/12 1626 11/09/12 0807 11/11/12 0611  WBC 11.4*  < > 6.8 5.8 7.3 7.1 7.1  NEUTROABS 9.0*  --   --   --   --   --   --  HGB 7.8*  < > 5.7* 5.8* 6.0* 6.7* 6.5*  HCT 22.8*  < > 17.3* 17.9* 18.4* 19.9* 19.6*  MCV 83.5  < > 84.4 85.2 84.8 83.3 83.4  PLT 132*  < > 117* 115* 140* 151 172  < > = values in this interval not displayed.   SignedPamella Pert  [facility] 11/11/2012, 10:28 AM

## 2012-11-11 NOTE — Care Management Note (Addendum)
    Page 1 of 2   11/11/2012     2:10:16 PM   CARE MANAGEMENT NOTE 11/11/2012  Patient:  Harold Price,Harold Price   Account Number:  [id number]  Date Initiated:  11/08/2012  Documentation initiated by:  Junius Creamer  Subjective/Objective Assessment:   adm w gi bleed     Action/Plan:   lives w wife  pt eval- rec hhpt   Anticipated DC Date:  11/11/2012   Anticipated DC Plan:  HOME W HOME HEALTH SERVICES      DC Planning Services  CM consult      [facility] Choice  HOME HEALTH   Choice offered to / List presented to:  C-1 Patient   DME arranged  Levan Hurst      DME agency  [facility]     HH arranged  HH-2 PT      [facility] agency  [facility]   Status of service:  Completed, signed off Medicare Important Message given?   (If response is "NO", the following Medicare IM given date fields will be blank) Date Medicare IM given:   Date Additional Medicare IM given:    Discharge Disposition:  HOME W HOME HEALTH SERVICES  Per UR Regulation:  Reviewed for med. necessity/level of care/duration of stay  If discussed at Long Length of Stay Meetings, dates discussed:    Comments:  11/11/12 11:08 Letha Cape RN, BSN [phone] patient lives with spouse, per physical therapy patient will need hhpt and rolling walker.  Patient chose [facility] from agency list, referral made to [facility], Lupita Leash notified.  There is an order in for rolling walker.Patient is for dicharge today, soc will begin 24-48 hrs post dischare.  Set patient up with hospital f/u appt with [facility] Medicine, Dr. Farris Has on Monday 3, 2014.  2/24 1350 debbie dowell rn,bsn

## 2015-04-19 ENCOUNTER — Inpatient Hospital Stay (HOSPITAL_COMMUNITY)
Admission: EM | Admit: 2015-04-19 | Discharge: 2015-05-03 | DRG: 329 | Disposition: A | Discharge status: Skilled Nursing Facility | Payer: Medicare Other | Attending: Internal Medicine | Admitting: Internal Medicine

## 2015-04-19 ENCOUNTER — Encounter (HOSPITAL_COMMUNITY): Payer: Self-pay | Admitting: *Deleted

## 2015-04-19 DIAGNOSIS — K279 Peptic ulcer, site unspecified, unspecified as acute or chronic, without hemorrhage or perforation: Secondary | ICD-10-CM

## 2015-04-19 DIAGNOSIS — F32A Depression, unspecified: Secondary | ICD-10-CM

## 2015-04-19 DIAGNOSIS — I4891 Unspecified atrial fibrillation: Secondary | ICD-10-CM | POA: Diagnosis not present

## 2015-04-19 DIAGNOSIS — C180 Malignant neoplasm of cecum: Principal | ICD-10-CM | POA: Diagnosis present

## 2015-04-19 DIAGNOSIS — Z79899 Other long term (current) drug therapy: Secondary | ICD-10-CM

## 2015-04-19 DIAGNOSIS — K567 Ileus, unspecified: Secondary | ICD-10-CM

## 2015-04-19 DIAGNOSIS — K9419 Other complications of enterostomy: Secondary | ICD-10-CM

## 2015-04-19 DIAGNOSIS — B3749 Other urogenital candidiasis: Secondary | ICD-10-CM | POA: Diagnosis not present

## 2015-04-19 DIAGNOSIS — A4159 Other Gram-negative sepsis: Secondary | ICD-10-CM | POA: Diagnosis not present

## 2015-04-19 DIAGNOSIS — Z531 Procedure and treatment not carried out because of patient's decision for reasons of belief and group pressure: Secondary | ICD-10-CM | POA: Diagnosis present

## 2015-04-19 DIAGNOSIS — I517 Cardiomegaly: Secondary | ICD-10-CM | POA: Diagnosis present

## 2015-04-19 DIAGNOSIS — E876 Hypokalemia: Secondary | ICD-10-CM | POA: Diagnosis not present

## 2015-04-19 DIAGNOSIS — R059 Cough, unspecified: Secondary | ICD-10-CM

## 2015-04-19 DIAGNOSIS — K913 Postprocedural intestinal obstruction: Secondary | ICD-10-CM | POA: Diagnosis not present

## 2015-04-19 DIAGNOSIS — R6521 Severe sepsis with septic shock: Secondary | ICD-10-CM | POA: Diagnosis not present

## 2015-04-19 DIAGNOSIS — K264 Chronic or unspecified duodenal ulcer with hemorrhage: Secondary | ICD-10-CM | POA: Diagnosis present

## 2015-04-19 DIAGNOSIS — F329 Major depressive disorder, single episode, unspecified: Secondary | ICD-10-CM | POA: Diagnosis present

## 2015-04-19 DIAGNOSIS — D72829 Elevated white blood cell count, unspecified: Secondary | ICD-10-CM

## 2015-04-19 DIAGNOSIS — Z4659 Encounter for fitting and adjustment of other gastrointestinal appliance and device: Secondary | ICD-10-CM

## 2015-04-19 DIAGNOSIS — C762 Malignant neoplasm of abdomen: Secondary | ICD-10-CM

## 2015-04-19 DIAGNOSIS — I10 Essential (primary) hypertension: Secondary | ICD-10-CM

## 2015-04-19 DIAGNOSIS — C779 Secondary and unspecified malignant neoplasm of lymph node, unspecified: Secondary | ICD-10-CM | POA: Diagnosis present

## 2015-04-19 DIAGNOSIS — K56609 Unspecified intestinal obstruction, unspecified as to partial versus complete obstruction: Secondary | ICD-10-CM

## 2015-04-19 DIAGNOSIS — R634 Abnormal weight loss: Secondary | ICD-10-CM | POA: Diagnosis present

## 2015-04-19 DIAGNOSIS — R05 Cough: Secondary | ICD-10-CM

## 2015-04-19 LAB — COMPREHENSIVE METABOLIC PANEL
ALBUMIN: 3.9 g/dL (ref 3.5–5.0)
ALT: 8 U/L — ABNORMAL LOW (ref 17–63)
ANION GAP: 10 (ref 5–15)
AST: 24 U/L (ref 15–41)
Alkaline Phosphatase: 60 U/L (ref 38–126)
BILIRUBIN TOTAL: 1.1 mg/dL (ref 0.3–1.2)
BUN: 11 mg/dL (ref 6–20)
CO2: 30 mmol/L (ref 22–32)
Calcium: 10.3 mg/dL (ref 8.9–10.3)
Chloride: 101 mmol/L (ref 101–111)
Creatinine, Ser: 1.19 mg/dL (ref 0.61–1.24)
GFR calc Af Amer: >60 mL/min (ref >60)
GFR calc non Af Amer: 55 mL/min — ABNORMAL LOW (ref >60)
Glucose, Bld: 154 mg/dL — ABNORMAL HIGH (ref 65–99)
POTASSIUM: 3.6 mmol/L (ref 3.5–5.1)
SODIUM: 141 mmol/L (ref 135–145)
Total Protein: 7 g/dL (ref 6.5–8.1)

## 2015-04-19 LAB — CBC
HEMATOCRIT: 41.6 % (ref 39.0–52.0)
HEMOGLOBIN: 13.7 g/dL (ref 13.0–17.0)
MCH: 25.3 pg — ABNORMAL LOW (ref 26.0–34.0)
MCHC: 32.9 g/dL (ref 30.0–36.0)
MCV: 76.8 fL — AB (ref 78.0–100.0)
Platelets: 177 10*3/uL (ref 150–400)
RBC: 5.42 MIL/uL (ref 4.22–5.81)
RDW: 16 % — ABNORMAL HIGH (ref 11.5–15.5)
WBC: 12.9 10*3/uL — ABNORMAL HIGH (ref 4.0–10.5)

## 2015-04-19 LAB — LIPASE, BLOOD: Lipase: 17 U/L — ABNORMAL LOW (ref 22–51)

## 2015-04-19 MED ORDER — ONDANSETRON HCL 4 MG/2ML IJ SOLN: 4.0000 mg | Freq: Once | INTRAMUSCULAR | Status: DC

## 2015-04-19 MED ORDER — SODIUM CHLORIDE 0.9 % IV BOLUS (SEPSIS)
500.0000 mL | Freq: Once | INTRAVENOUS | Status: AC
  Administered 2015-04-19: 500 mL via INTRAVENOUS

## 2015-04-19 MED ORDER — ALUM & MAG HYDROXIDE-SIMETH 200-200-20 MG/5ML PO SUSP
30.0000 mL | Freq: Once | ORAL | Status: AC
  Administered 2015-04-20: 30 mL via ORAL
  Filled 2015-04-19: qty 30

## 2015-04-19 MED ORDER — IOHEXOL 300 MG/ML  SOLN
25.0000 mL | Freq: Once | INTRAMUSCULAR | Status: AC | PRN
  Administered 2015-04-20: 25 mL via ORAL

## 2015-04-19 MED ORDER — ONDANSETRON 4 MG PO TBDP
ORAL_TABLET | ORAL | Status: AC
  Filled 2015-04-19: qty 1

## 2015-04-19 MED ORDER — ONDANSETRON 4 MG PO TBDP
4.0000 mg | ORAL_TABLET | Freq: Once | ORAL | Status: AC | PRN
  Administered 2015-04-19: 4 mg via ORAL

## 2015-04-19 NOTE — ED Notes (Signed)
Pt has urinal within reach and informed of need for urine sample; pt verbalizes understanding

## 2015-04-19 NOTE — ED Notes (Signed)
Pt reports mid abdominal pain that started last night. Pt has vomited 6 times in the last 24hrs.

## 2015-04-19 NOTE — ED Notes (Signed)
The patient is unable to give an urine specimen at this time. The the has reported to the RN in charge.

## 2015-04-20 ENCOUNTER — Inpatient Hospital Stay (HOSPITAL_COMMUNITY): Payer: Medicare Other | Admitting: Certified Registered Nurse Anesthetist

## 2015-04-20 ENCOUNTER — Encounter (HOSPITAL_COMMUNITY): Payer: Self-pay

## 2015-04-20 ENCOUNTER — Encounter (HOSPITAL_COMMUNITY)
Admission: EM | Disposition: A | Discharge status: Skilled Nursing Facility | Payer: Self-pay | Source: Home / Self Care | Attending: Internal Medicine

## 2015-04-20 ENCOUNTER — Emergency Department (HOSPITAL_COMMUNITY): Payer: Medicare Other

## 2015-04-20 DIAGNOSIS — E876 Hypokalemia: Secondary | ICD-10-CM | POA: Diagnosis not present

## 2015-04-20 DIAGNOSIS — Z79899 Other long term (current) drug therapy: Secondary | ICD-10-CM | POA: Diagnosis not present

## 2015-04-20 DIAGNOSIS — K279 Peptic ulcer, site unspecified, unspecified as acute or chronic, without hemorrhage or perforation: Secondary | ICD-10-CM

## 2015-04-20 DIAGNOSIS — K264 Chronic or unspecified duodenal ulcer with hemorrhage: Secondary | ICD-10-CM | POA: Diagnosis not present

## 2015-04-20 DIAGNOSIS — K913 Postprocedural intestinal obstruction: Secondary | ICD-10-CM | POA: Diagnosis not present

## 2015-04-20 DIAGNOSIS — C260 Malignant neoplasm of intestinal tract, part unspecified: Secondary | ICD-10-CM | POA: Diagnosis not present

## 2015-04-20 DIAGNOSIS — A419 Sepsis, unspecified organism: Secondary | ICD-10-CM | POA: Diagnosis not present

## 2015-04-20 DIAGNOSIS — K5669 Other intestinal obstruction: Secondary | ICD-10-CM | POA: Diagnosis not present

## 2015-04-20 DIAGNOSIS — R634 Abnormal weight loss: Secondary | ICD-10-CM | POA: Diagnosis not present

## 2015-04-20 DIAGNOSIS — R6521 Severe sepsis with septic shock: Secondary | ICD-10-CM | POA: Diagnosis not present

## 2015-04-20 DIAGNOSIS — C180 Malignant neoplasm of cecum: Secondary | ICD-10-CM | POA: Diagnosis present

## 2015-04-20 DIAGNOSIS — F1010 Alcohol abuse, uncomplicated: Secondary | ICD-10-CM | POA: Diagnosis not present

## 2015-04-20 DIAGNOSIS — I10 Essential (primary) hypertension: Secondary | ICD-10-CM | POA: Diagnosis not present

## 2015-04-20 DIAGNOSIS — A4159 Other Gram-negative sepsis: Secondary | ICD-10-CM | POA: Diagnosis not present

## 2015-04-20 DIAGNOSIS — K6389 Other specified diseases of intestine: Secondary | ICD-10-CM | POA: Diagnosis not present

## 2015-04-20 DIAGNOSIS — K56609 Unspecified intestinal obstruction, unspecified as to partial versus complete obstruction: Secondary | ICD-10-CM | POA: Diagnosis present

## 2015-04-20 DIAGNOSIS — F329 Major depressive disorder, single episode, unspecified: Secondary | ICD-10-CM | POA: Diagnosis not present

## 2015-04-20 DIAGNOSIS — B3749 Other urogenital candidiasis: Secondary | ICD-10-CM | POA: Diagnosis not present

## 2015-04-20 DIAGNOSIS — I4891 Unspecified atrial fibrillation: Secondary | ICD-10-CM | POA: Diagnosis not present

## 2015-04-20 DIAGNOSIS — Z531 Procedure and treatment not carried out because of patient's decision for reasons of belief and group pressure: Secondary | ICD-10-CM

## 2015-04-20 DIAGNOSIS — I517 Cardiomegaly: Secondary | ICD-10-CM | POA: Diagnosis not present

## 2015-04-20 DIAGNOSIS — C779 Secondary and unspecified malignant neoplasm of lymph node, unspecified: Secondary | ICD-10-CM | POA: Diagnosis present

## 2015-04-20 DIAGNOSIS — I425 Other restrictive cardiomyopathy: Secondary | ICD-10-CM | POA: Diagnosis not present

## 2015-04-20 HISTORY — PX: LAPAROTOMY: SHX154

## 2015-04-20 LAB — COMPREHENSIVE METABOLIC PANEL
ALBUMIN: 3.7 g/dL (ref 3.5–5.0)
ALK PHOS: 56 U/L (ref 38–126)
ALT: 6 U/L — ABNORMAL LOW (ref 17–63)
ANION GAP: 10 (ref 5–15)
AST: 17 U/L (ref 15–41)
BUN: 9 mg/dL (ref 6–20)
CALCIUM: 9.3 mg/dL (ref 8.9–10.3)
CO2: 28 mmol/L (ref 22–32)
CREATININE: 1.11 mg/dL (ref 0.61–1.24)
Chloride: 101 mmol/L (ref 101–111)
GFR, EST NON AFRICAN AMERICAN: 60 mL/min — AB (ref >60)
Glucose, Bld: 127 mg/dL — ABNORMAL HIGH (ref 65–99)
POTASSIUM: 3.5 mmol/L (ref 3.5–5.1)
Sodium: 139 mmol/L (ref 135–145)
Total Bilirubin: 0.6 mg/dL (ref 0.3–1.2)
Total Protein: 6.5 g/dL (ref 6.5–8.1)

## 2015-04-20 LAB — CBC
HEMATOCRIT: 41.7 % (ref 39.0–52.0)
Hemoglobin: 13.1 g/dL (ref 13.0–17.0)
MCH: 24.4 pg — ABNORMAL LOW (ref 26.0–34.0)
MCHC: 31.4 g/dL (ref 30.0–36.0)
MCV: 77.7 fL — AB (ref 78.0–100.0)
Platelets: 155 10*3/uL (ref 150–400)
RBC: 5.37 MIL/uL (ref 4.22–5.81)
RDW: 16 % — ABNORMAL HIGH (ref 11.5–15.5)
WBC: 11.6 10*3/uL — AB (ref 4.0–10.5)

## 2015-04-20 LAB — URINE MICROSCOPIC-ADD ON

## 2015-04-20 LAB — URINALYSIS, ROUTINE W REFLEX MICROSCOPIC
Bilirubin Urine: NEGATIVE
GLUCOSE, UA: NEGATIVE mg/dL
KETONES UR: NEGATIVE mg/dL
Leukocytes, UA: NEGATIVE
Nitrite: NEGATIVE
PH: 8.5 — AB (ref 5.0–8.0)
Protein, ur: 30 mg/dL — AB
SPECIFIC GRAVITY, URINE: 1.023 (ref 1.005–1.030)
Urobilinogen, UA: 1 mg/dL (ref 0.0–1.0)

## 2015-04-20 LAB — GLUCOSE, CAPILLARY
GLUCOSE-CAPILLARY: 101 mg/dL — AB (ref 65–99)
Glucose-Capillary: 107 mg/dL — ABNORMAL HIGH (ref 65–99)
Glucose-Capillary: 153 mg/dL — ABNORMAL HIGH (ref 65–99)

## 2015-04-20 LAB — PROTIME-INR
INR: 1.04 (ref 0.00–1.49)
PROTHROMBIN TIME: 13.8 s (ref 11.6–15.2)

## 2015-04-20 LAB — NO BLOOD PRODUCTS

## 2015-04-20 LAB — PROCALCITONIN: Procalcitonin: <0.1 ng/mL

## 2015-04-20 LAB — LACTIC ACID, PLASMA: LACTIC ACID, VENOUS: 1.5 mmol/L (ref 0.5–2.0)

## 2015-04-20 LAB — APTT: aPTT: 32 seconds (ref 24–37)

## 2015-04-20 SURGERY — LAPAROTOMY, EXPLORATORY
Anesthesia: General | Site: Abdomen

## 2015-04-20 MED ORDER — PHENYLEPHRINE HCL 10 MG/ML IJ SOLN
INTRAMUSCULAR | Status: DC | PRN
  Administered 2015-04-20: 80 ug via INTRAVENOUS
  Administered 2015-04-20: 40 ug via INTRAVENOUS
  Administered 2015-04-20: 80 ug via INTRAVENOUS
  Administered 2015-04-20: 120 ug via INTRAVENOUS
  Administered 2015-04-20 (×2): 80 ug via INTRAVENOUS

## 2015-04-20 MED ORDER — MIDAZOLAM HCL 2 MG/2ML IJ SOLN
INTRAMUSCULAR | Status: AC
  Filled 2015-04-20: qty 4

## 2015-04-20 MED ORDER — LORAZEPAM 2 MG/ML IJ SOLN
0.0000 mg | Freq: Two times a day (BID) | INTRAMUSCULAR | Status: DC
  Filled 2015-04-20: qty 1

## 2015-04-20 MED ORDER — SUCCINYLCHOLINE CHLORIDE 20 MG/ML IJ SOLN
INTRAMUSCULAR | Status: DC | PRN
  Administered 2015-04-20: 100 mg via INTRAVENOUS

## 2015-04-20 MED ORDER — ONDANSETRON HCL 4 MG/2ML IJ SOLN
4.0000 mg | Freq: Once | INTRAMUSCULAR | Status: AC
  Administered 2015-04-20: 4 mg via INTRAVENOUS
  Filled 2015-04-20: qty 2

## 2015-04-20 MED ORDER — MORPHINE SULFATE 2 MG/ML IJ SOLN
2.0000 mg | INTRAMUSCULAR | Status: DC | PRN
  Administered 2015-04-20: 2 mg via INTRAVENOUS
  Filled 2015-04-20: qty 1

## 2015-04-20 MED ORDER — PROPOFOL 10 MG/ML IV BOLUS
INTRAVENOUS | Status: DC | PRN
  Administered 2015-04-20: 80 mg via INTRAVENOUS

## 2015-04-20 MED ORDER — ACETAMINOPHEN 650 MG RE SUPP: 650.0000 mg | Freq: Four times a day (QID) | RECTAL | Status: DC | PRN

## 2015-04-20 MED ORDER — PANTOPRAZOLE SODIUM 40 MG IV SOLR
40.0000 mg | INTRAVENOUS | Status: DC
  Administered 2015-04-21 – 2015-04-29 (×9): 40 mg via INTRAVENOUS
  Filled 2015-04-20 (×10): qty 40

## 2015-04-20 MED ORDER — LORAZEPAM 1 MG PO TABS: 1.0000 mg | ORAL_TABLET | Freq: Four times a day (QID) | ORAL | Status: AC | PRN

## 2015-04-20 MED ORDER — PANTOPRAZOLE SODIUM 40 MG PO TBEC
40.0000 mg | DELAYED_RELEASE_TABLET | Freq: Every day | ORAL | Status: DC
  Administered 2015-04-20: 40 mg via ORAL
  Filled 2015-04-20: qty 1

## 2015-04-20 MED ORDER — THIAMINE HCL 100 MG/ML IJ SOLN
100.0000 mg | Freq: Every day | INTRAMUSCULAR | Status: DC
  Administered 2015-04-21 – 2015-04-22 (×2): 100 mg via INTRAVENOUS
  Filled 2015-04-20 (×2): qty 2

## 2015-04-20 MED ORDER — MIDAZOLAM HCL 5 MG/5ML IJ SOLN
INTRAMUSCULAR | Status: DC | PRN
  Administered 2015-04-20 (×2): 1 mg via INTRAVENOUS

## 2015-04-20 MED ORDER — CHLORHEXIDINE GLUCONATE 0.12% ORAL RINSE (MEDLINE KIT)
15.0000 mL | Freq: Two times a day (BID) | OROMUCOSAL | Status: DC
  Administered 2015-04-20 – 2015-04-23 (×5): 15 mL via OROMUCOSAL

## 2015-04-20 MED ORDER — DEXAMETHASONE SODIUM PHOSPHATE 4 MG/ML IJ SOLN
INTRAMUSCULAR | Status: AC
  Filled 2015-04-20: qty 3

## 2015-04-20 MED ORDER — HYDRALAZINE HCL 20 MG/ML IJ SOLN: 5.0000 mg | INTRAMUSCULAR | Status: DC | PRN

## 2015-04-20 MED ORDER — SUCCINYLCHOLINE CHLORIDE 20 MG/ML IJ SOLN
INTRAMUSCULAR | Status: AC
  Filled 2015-04-20: qty 1

## 2015-04-20 MED ORDER — PHENYLEPHRINE HCL 10 MG/ML IJ SOLN
INTRAMUSCULAR | Status: AC
  Filled 2015-04-20: qty 1

## 2015-04-20 MED ORDER — FENTANYL CITRATE (PF) 250 MCG/5ML IJ SOLN
INTRAMUSCULAR | Status: AC
  Filled 2015-04-20: qty 5

## 2015-04-20 MED ORDER — MORPHINE SULFATE 2 MG/ML IJ SOLN
1.0000 mg | INTRAMUSCULAR | Status: DC | PRN
  Administered 2015-04-20 – 2015-04-21 (×5): 2 mg via INTRAVENOUS
  Administered 2015-04-21: 4 mg via INTRAVENOUS
  Administered 2015-04-22 (×3): 2 mg via INTRAVENOUS
  Administered 2015-04-23: 1 mg via INTRAVENOUS
  Administered 2015-04-23 – 2015-04-24 (×3): 2 mg via INTRAVENOUS
  Filled 2015-04-20 (×13): qty 1

## 2015-04-20 MED ORDER — DEXTROSE 5 % IV SOLN: 2.0000 g | Freq: Once | INTRAVENOUS | Status: DC

## 2015-04-20 MED ORDER — FENTANYL CITRATE (PF) 100 MCG/2ML IJ SOLN
INTRAMUSCULAR | Status: DC | PRN
  Administered 2015-04-20: 100 ug via INTRAVENOUS
  Administered 2015-04-20: 50 ug via INTRAVENOUS
  Administered 2015-04-20: 100 ug via INTRAVENOUS
  Administered 2015-04-20: 50 ug via INTRAVENOUS
  Administered 2015-04-20: 100 ug via INTRAVENOUS
  Administered 2015-04-20: 50 ug via INTRAVENOUS

## 2015-04-20 MED ORDER — DEXAMETHASONE SODIUM PHOSPHATE 4 MG/ML IJ SOLN
INTRAMUSCULAR | Status: AC
  Filled 2015-04-20: qty 2

## 2015-04-20 MED ORDER — IOHEXOL 300 MG/ML  SOLN
100.0000 mL | Freq: Once | INTRAMUSCULAR | Status: AC | PRN
  Administered 2015-04-20: 100 mL via INTRAVENOUS

## 2015-04-20 MED ORDER — FOLIC ACID 1 MG PO TABS
1.0000 mg | ORAL_TABLET | Freq: Every day | ORAL | Status: DC
  Administered 2015-04-23 – 2015-04-24 (×2): 1 mg via ORAL
  Filled 2015-04-20 (×2): qty 1

## 2015-04-20 MED ORDER — ADULT MULTIVITAMIN W/MINERALS CH
1.0000 | ORAL_TABLET | Freq: Every day | ORAL | Status: DC
  Administered 2015-04-23 – 2015-05-03 (×9): 1 via ORAL
  Filled 2015-04-20 (×10): qty 1

## 2015-04-20 MED ORDER — ROCURONIUM BROMIDE 50 MG/5ML IV SOLN
INTRAVENOUS | Status: AC
  Filled 2015-04-20: qty 1

## 2015-04-20 MED ORDER — PROPOFOL 10 MG/ML IV BOLUS
INTRAVENOUS | Status: AC
  Filled 2015-04-20: qty 20

## 2015-04-20 MED ORDER — ROCURONIUM BROMIDE 100 MG/10ML IV SOLN
INTRAVENOUS | Status: DC | PRN
  Administered 2015-04-20: 30 mg via INTRAVENOUS

## 2015-04-20 MED ORDER — HEPARIN SODIUM (PORCINE) 5000 UNIT/ML IJ SOLN
5000.0000 [IU] | Freq: Three times a day (TID) | INTRAMUSCULAR | Status: DC
  Administered 2015-04-21 – 2015-04-22 (×3): 5000 [IU] via SUBCUTANEOUS
  Filled 2015-04-20 (×2): qty 1

## 2015-04-20 MED ORDER — HYDROMORPHONE HCL 1 MG/ML IJ SOLN
0.2500 mg | INTRAMUSCULAR | Status: DC | PRN
  Administered 2015-04-20 (×2): 0.5 mg via INTRAVENOUS

## 2015-04-20 MED ORDER — NEOSTIGMINE METHYLSULFATE 10 MG/10ML IV SOLN
INTRAVENOUS | Status: DC | PRN
  Administered 2015-04-20: 4 mg via INTRAVENOUS

## 2015-04-20 MED ORDER — LORAZEPAM 2 MG/ML IJ SOLN: 0.0000 mg | Freq: Four times a day (QID) | INTRAMUSCULAR | Status: AC

## 2015-04-20 MED ORDER — GLYCOPYRROLATE 0.2 MG/ML IJ SOLN
INTRAMUSCULAR | Status: DC | PRN
  Administered 2015-04-20: 0.6 mg via INTRAVENOUS

## 2015-04-20 MED ORDER — PNEUMOCOCCAL VAC POLYVALENT 25 MCG/0.5ML IJ INJ
0.5000 mL | INJECTION | INTRAMUSCULAR | Status: AC
  Administered 2015-04-23: 0.5 mL via INTRAMUSCULAR
  Filled 2015-04-20: qty 0.5

## 2015-04-20 MED ORDER — SODIUM CHLORIDE 0.9 % IJ SOLN
3.0000 mL | Freq: Two times a day (BID) | INTRAMUSCULAR | Status: DC
  Administered 2015-04-20 – 2015-04-30 (×11): 3 mL via INTRAVENOUS
  Administered 2015-05-01: 10 mL via INTRAVENOUS
  Administered 2015-05-01 (×2): 3 mL via INTRAVENOUS
  Administered 2015-05-02: 10 mL via INTRAVENOUS
  Administered 2015-05-03: 3 mL via INTRAVENOUS

## 2015-04-20 MED ORDER — DEXTROSE 5 % IV SOLN
10.0000 mg | INTRAVENOUS | Status: DC | PRN
  Administered 2015-04-20: 50 ug/min via INTRAVENOUS

## 2015-04-20 MED ORDER — SODIUM CHLORIDE 0.9 % IV BOLUS (SEPSIS)
500.0000 mL | Freq: Once | INTRAVENOUS | Status: AC
  Administered 2015-04-20: 500 mL via INTRAVENOUS

## 2015-04-20 MED ORDER — SODIUM CHLORIDE 0.9 % IV SOLN
INTRAVENOUS | Status: DC
  Administered 2015-04-21 – 2015-04-25 (×7): via INTRAVENOUS

## 2015-04-20 MED ORDER — 0.9 % SODIUM CHLORIDE (POUR BTL) OPTIME
TOPICAL | Status: DC | PRN
  Administered 2015-04-20: 1000 mL
  Administered 2015-04-20: 2000 mL
  Administered 2015-04-20: 1000 mL

## 2015-04-20 MED ORDER — LORAZEPAM 2 MG/ML IJ SOLN
1.0000 mg | Freq: Four times a day (QID) | INTRAMUSCULAR | Status: AC | PRN
  Administered 2015-04-20: 1 mg via INTRAVENOUS
  Filled 2015-04-20: qty 1

## 2015-04-20 MED ORDER — ONDANSETRON HCL 4 MG/2ML IJ SOLN: 4.0000 mg | Freq: Three times a day (TID) | INTRAMUSCULAR | Status: DC | PRN

## 2015-04-20 MED ORDER — DEXTROSE 5 % IV SOLN
2.0000 g | INTRAVENOUS | Status: AC
  Administered 2015-04-20: 2 g via INTRAVENOUS
  Filled 2015-04-20: qty 2

## 2015-04-20 MED ORDER — VITAMIN B-1 100 MG PO TABS
100.0000 mg | ORAL_TABLET | Freq: Every day | ORAL | Status: DC
  Administered 2015-04-23 – 2015-04-24 (×2): 100 mg via ORAL
  Filled 2015-04-20 (×2): qty 1

## 2015-04-20 MED ORDER — ONDANSETRON HCL 4 MG/2ML IJ SOLN
4.0000 mg | Freq: Three times a day (TID) | INTRAMUSCULAR | Status: DC | PRN
  Administered 2015-04-20 – 2015-04-24 (×4): 4 mg via INTRAVENOUS
  Filled 2015-04-20 (×3): qty 2

## 2015-04-20 MED ORDER — LIDOCAINE HCL (CARDIAC) 20 MG/ML IV SOLN
INTRAVENOUS | Status: DC | PRN
  Administered 2015-04-20: 50 mg via INTRAVENOUS

## 2015-04-20 MED ORDER — DEXAMETHASONE SODIUM PHOSPHATE 10 MG/ML IJ SOLN
INTRAMUSCULAR | Status: AC
  Filled 2015-04-20: qty 1

## 2015-04-20 MED ORDER — DEXAMETHASONE SODIUM PHOSPHATE 4 MG/ML IJ SOLN
INTRAMUSCULAR | Status: DC | PRN
  Administered 2015-04-20: 8 mg via INTRAVENOUS

## 2015-04-20 MED ORDER — ACETAMINOPHEN 325 MG PO TABS: 650.0000 mg | ORAL_TABLET | Freq: Four times a day (QID) | ORAL | Status: DC | PRN

## 2015-04-20 MED ORDER — HYDROMORPHONE HCL 1 MG/ML IJ SOLN
INTRAMUSCULAR | Status: AC
  Filled 2015-04-20: qty 1

## 2015-04-20 MED ORDER — PROMETHAZINE HCL 25 MG/ML IJ SOLN
12.5000 mg | Freq: Once | INTRAMUSCULAR | Status: DC
  Filled 2015-04-20: qty 1

## 2015-04-20 MED ORDER — CETYLPYRIDINIUM CHLORIDE 0.05 % MT LIQD
7.0000 mL | Freq: Two times a day (BID) | OROMUCOSAL | Status: DC
  Administered 2015-04-21 – 2015-05-03 (×16): 7 mL via OROMUCOSAL

## 2015-04-20 MED ORDER — LACTATED RINGERS IV SOLN
INTRAVENOUS | Status: DC
  Administered 2015-04-20 (×3): via INTRAVENOUS

## 2015-04-20 MED ORDER — PHENYLEPHRINE 40 MCG/ML (10ML) SYRINGE FOR IV PUSH (FOR BLOOD PRESSURE SUPPORT)
PREFILLED_SYRINGE | INTRAVENOUS | Status: AC
  Filled 2015-04-20: qty 10

## 2015-04-20 MED ORDER — ALBUMIN HUMAN 5 % IV SOLN
INTRAVENOUS | Status: DC | PRN
  Administered 2015-04-20 (×2): via INTRAVENOUS

## 2015-04-20 SURGICAL SUPPLY — 62 items
BLADE SURG ROTATE 9660 (MISCELLANEOUS) ×3 IMPLANT
CANISTER SUCTION 2500CC (MISCELLANEOUS) ×3 IMPLANT
CHLORAPREP W/TINT 26ML (MISCELLANEOUS) ×3 IMPLANT
COVER MAYO STAND STRL (DRAPES) ×3 IMPLANT
COVER SURGICAL LIGHT HANDLE (MISCELLANEOUS) ×3 IMPLANT
DRAPE LAPAROSCOPIC ABDOMINAL (DRAPES) ×3 IMPLANT
DRAPE PROXIMA HALF (DRAPES) IMPLANT
DRAPE UTILITY XL STRL (DRAPES) ×6 IMPLANT
DRAPE WARM FLUID 44X44 (DRAPE) ×3 IMPLANT
DRSG OPSITE POSTOP 4X10 (GAUZE/BANDAGES/DRESSINGS) ×3 IMPLANT
DRSG OPSITE POSTOP 4X8 (GAUZE/BANDAGES/DRESSINGS) IMPLANT
ELECT BLADE 6.5 EXT (BLADE) ×3 IMPLANT
ELECT CAUTERY BLADE 6.4 (BLADE) ×6 IMPLANT
ELECT REM PT RETURN 9FT ADLT (ELECTROSURGICAL) ×3
ELECTRODE REM PT RTRN 9FT ADLT (ELECTROSURGICAL) ×1 IMPLANT
GLOVE BIO SURGEON STRL SZ 6.5 (GLOVE) ×2 IMPLANT
GLOVE BIO SURGEON STRL SZ7 (GLOVE) ×3 IMPLANT
GLOVE BIO SURGEONS STRL SZ 6.5 (GLOVE) ×1
GLOVE BIOGEL PI IND STRL 6.5 (GLOVE) ×1 IMPLANT
GLOVE BIOGEL PI IND STRL 7.0 (GLOVE) ×1 IMPLANT
GLOVE BIOGEL PI IND STRL 7.5 (GLOVE) ×1 IMPLANT
GLOVE BIOGEL PI IND STRL 8 (GLOVE) ×1 IMPLANT
GLOVE BIOGEL PI INDICATOR 6.5 (GLOVE) ×2
GLOVE BIOGEL PI INDICATOR 7.0 (GLOVE) ×2
GLOVE BIOGEL PI INDICATOR 7.5 (GLOVE) ×2
GLOVE BIOGEL PI INDICATOR 8 (GLOVE) ×2
GLOVE ECLIPSE 7.0 STRL STRAW (GLOVE) ×3 IMPLANT
GLOVE SURG SS PI 7.0 STRL IVOR (GLOVE) ×3 IMPLANT
GOWN STRL REUS W/ TWL LRG LVL3 (GOWN DISPOSABLE) ×4 IMPLANT
GOWN STRL REUS W/TWL LRG LVL3 (GOWN DISPOSABLE) ×8
KIT BASIN OR (CUSTOM PROCEDURE TRAY) ×3 IMPLANT
KIT OSTOMY DRAINABLE 2.75 STR (WOUND CARE) ×3 IMPLANT
KIT ROOM TURNOVER OR (KITS) ×3 IMPLANT
LIGASURE IMPACT 36 18CM CVD LR (INSTRUMENTS) ×3 IMPLANT
NS IRRIG 1000ML POUR BTL (IV SOLUTION) ×6 IMPLANT
PACK GENERAL/GYN (CUSTOM PROCEDURE TRAY) ×3 IMPLANT
PAD ARMBOARD 7.5X6 YLW CONV (MISCELLANEOUS) ×3 IMPLANT
PENCIL BUTTON HOLSTER BLD 10FT (ELECTRODE) IMPLANT
RELOAD PROXIMATE 75MM BLUE (ENDOMECHANICALS) ×9 IMPLANT
SPECIMEN JAR LARGE (MISCELLANEOUS) IMPLANT
SPONGE LAP 18X18 X RAY DECT (DISPOSABLE) ×12 IMPLANT
STAPLER CUT CVD 40MM GREEN (STAPLE) ×3 IMPLANT
STAPLER CUT RELOAD GREEN (STAPLE) ×3 IMPLANT
STAPLER PROXIMATE 75MM BLUE (STAPLE) ×3 IMPLANT
STAPLER VISISTAT 35W (STAPLE) ×3 IMPLANT
SUCTION POOLE TIP (SUCTIONS) ×3 IMPLANT
SUT PDS AB 1 TP1 96 (SUTURE) ×6 IMPLANT
SUT PROLENE 2 0 CT2 30 (SUTURE) ×3 IMPLANT
SUT SILK 2 0 (SUTURE) ×6
SUT SILK 2 0 SH CR/8 (SUTURE) ×3 IMPLANT
SUT SILK 2-0 18XBRD TIE 12 (SUTURE) ×3 IMPLANT
SUT SILK 3 0 (SUTURE) ×2
SUT SILK 3 0 SH CR/8 (SUTURE) ×3 IMPLANT
SUT SILK 3-0 18XBRD TIE 12 (SUTURE) ×1 IMPLANT
SUT VIC AB 3-0 SH 18 (SUTURE) ×6 IMPLANT
SUT VIC AB 3-0 SH 27 (SUTURE)
SUT VIC AB 3-0 SH 27X BRD (SUTURE) IMPLANT
TOWEL OR 17X26 10 PK STRL BLUE (TOWEL DISPOSABLE) ×3 IMPLANT
TRAY FOLEY CATH 16FRSI W/METER (SET/KITS/TRAYS/PACK) ×3 IMPLANT
TUBE CONNECTING 12'X1/4 (SUCTIONS) ×1
TUBE CONNECTING 12X1/4 (SUCTIONS) ×2 IMPLANT
YANKAUER SUCT BULB TIP NO VENT (SUCTIONS) ×3 IMPLANT

## 2015-04-20 NOTE — ED Notes (Signed)
Pt denies nausea at this time.

## 2015-04-20 NOTE — ED Notes (Signed)
CT informed that pt is finished drinking his oral contrast.

## 2015-04-20 NOTE — Anesthesia Preprocedure Evaluation (Addendum)
Anesthesia Evaluation  Patient identified by MRN, date of birth, ID band Patient awake    Reviewed: Allergy & Precautions, H&P , NPO status , Patient's Chart, lab work & pertinent test results  Airway Mallampati: II  TM Distance: >3 FB Neck ROM: Full    Dental no notable dental hx. (+) Edentulous Upper, Edentulous Lower, Dental Advisory Given   Pulmonary neg pulmonary ROS,  breath sounds clear to auscultation  Pulmonary exam normal       Cardiovascular hypertension, negative cardio ROS  Rhythm:Regular Rate:Normal     Neuro/Psych  Headaches, Depression negative neurological ROS  negative psych ROS   GI/Hepatic Neg liver ROS, PUD, Small bowel obstruction   Endo/Other  negative endocrine ROS  Renal/GU negative Renal ROS  negative genitourinary   Musculoskeletal  (+) Arthritis -, Osteoarthritis,    Abdominal   Peds  Hematology  (+) JEHOVAH'S WITNESSPt will accept albumin   Anesthesia Other Findings   Reproductive/Obstetrics negative OB ROS                            Anesthesia Physical Anesthesia Plan  ASA: III  Anesthesia Plan: General   Post-op Pain Management:    Induction: Intravenous, Rapid sequence and Cricoid pressure planned  Airway Management Planned: Oral ETT  Additional Equipment:   Intra-op Plan:   Post-operative Plan: Extubation in OR  Informed Consent: I have reviewed the patients History and Physical, chart, labs and discussed the procedure including the risks, benefits and alternatives for the proposed anesthesia with the patient or authorized representative who has indicated his/her understanding and acceptance.   Dental advisory given  Plan Discussed with: CRNA  Anesthesia Plan Comments:         Anesthesia Quick Evaluation

## 2015-04-20 NOTE — Op Note (Signed)
Preoperative diagnosis:Small bowel obstruction, ileocecal mass Postoperative diagnosis: same as above Procedure: #1 subtotal colectomy, including terminal ileum #2 end ileostomy Surgeon: Dr. Serita Grammes Asst.:Kelly Maxwell Caul Estimated blood loss: 50 mL Drains: None Specimens: right colon, terminal ileum and sigmoid/left colon as first specimen, second was transverse and remainder of left colon, omentum Complications: None Anesthesia: Gen. Sponge and needle count was correct at completion Disposition to recovery stable  Indications: This is a 82 yom who is Jehovahs Witness and drinks a half pint of liquor per day presents with sbo and mass in ileocecal region on ct scan.  I discussed exploration with likely right colectomy. He will not accept blood products.  Procedure: After informed consent was obtained the patient was taken to the operating room. He was given antibiotics. Sequential compression devices on his legs. He was placed under general anesthesia without complication. His abdomen was prepped and draped in the standard sterile surgical fashion. Surgical timeout was then performed. He had a Foley catheter as well as a nasogastric tube in place.  I then made a midline incision. I carried this into the peritoneum.He had murky fluid present.  He had a large mass in the right lower quadrant.  The case was very difficult due to the tumor and desmoplastic reaction. I was able to identify the dilated terminal ileum entering the right colon and mass.  Eventually I was able to rotate the TI and right colon up.  There was a desmoplastic reaction to the duodenum. The colon was separated from the duodenum with scissors.  I then was able to note the sigmoid colon and left colon being drawn into the mass.  I realized this was going to need to be resected en bloc. I then divided the sigmoid colon with a contour stapler.  I divided the terminal ileum with a gia stapler.  The transverse colon was divided  as well.  I then was able to remove the tumor and all bowel attached together.  Once I had done this there was not a lot of left colon remaining or transverse. The left colon was not going to be able to be brought up to the skin for a stoma either.  The splenic flexure was taken down with a lot of difficulty. I then removed this portion of colon as well.  I placed a prolene suture times two on the stump.  I then brought the ileum out to the skin. Copious irrigation was performed.  There was no evidence metastatic disease.  The liver had what appeared to be a number of small hemangiomas.  I then closed the abdomen with #1 looped PDS.  The skin was closed with staples.  The ileostomy was matured with 3-0 vicryl  Dressings were placed.  He tolerated this well, was extubated and transferred to recovery stable.

## 2015-04-20 NOTE — ED Notes (Signed)
MD at bedside. 

## 2015-04-20 NOTE — H&P (View-Only) (Signed)
Harold Price [birth year]  [id number].   Primary Care MD: none Requesting MD: Dr. Ivor Costa Chief Complaint/Reason for Consult: mass of small bowel, SBO HPI: This is an 81 yo BM who is a [place] who has a history of ETOH abuse, HTN, duodenal ulcer with UGI bleed, who began having some gassy type abdominal pain on Wednesday night at 12 MN.  He states that he then began to have nausea and vomiting, nonbloody.  He last passed flatus or had a BM around 12 noon yesterday.  He denies blood in his stool except 2 years ago when he had his UGI bleed.  He denies any change to his bowel habits recently or his appetite.  He does admit to recent unintentional weight loss, but he is not sure how much.  Due to pain and emesis, he presented to [facility] last night and had a CT scan that reveals a 4x6x8cm mass at his ICV with some surrounding enlarged lymph nodes resulting in a high grade SBO.  The patient was admitted and an NGT was placed.  We have been asked to evaluate the patient for further recommendations.  ROS : Please see HPI, otherwise all other systems are currently negative.  Family History  Problem Relation Age of Onset  . Ulcers Brother     Past Medical History  Diagnosis Date  . Hypertension   . PUD (peptic ulcer disease)     2006  . Depression   . Headache(784.0)   . Arthritis     general  . Alcohol abuse     Past Surgical History  Procedure Laterality Date  . Cataract extraction Right   . Esophagogastroduodenoscopy N/A 11/06/2012    Procedure: ESOPHAGOGASTRODUODENOSCOPY (EGD);  Surgeon: Lear Ng, MD;  Location: [facility] ENDOSCOPY;  Service: Endoscopy;  Laterality: N/A;  . Esophagogastroduodenoscopy N/A 11/07/2012    Procedure: ESOPHAGOGASTRODUODENOSCOPY (EGD);  Surgeon: Lear Ng, MD;  Location: [facility] ENDOSCOPY;  Service: Endoscopy;  Laterality: N/A;    Social History:  reports that he has never smoked. He does not have any smokeless tobacco history on file. He  reports that he drinks about 16.8 oz of alcohol per week. He reports that he does not use illicit drugs.  Allergies: No Known Allergies  Medications Prior to Admission  Medication Sig Dispense Refill  . alum & mag hydroxide-simeth (MAALOX/MYLANTA) 200-200-20 MG/5ML suspension Take 30 mLs by mouth every 6 (six) hours as needed for indigestion or heartburn.    . calcium carbonate (TUMS - DOSED IN MG ELEMENTAL CALCIUM) 500 MG chewable tablet Chew 1 tablet by mouth as needed for heartburn.    . Multiple Vitamin (MULTIVITAMIN WITH MINERALS) TABS Take 1 tablet by mouth daily.      Blood pressure 154/60, pulse 68, temperature 98 F (36.7 C), temperature source Oral, resp. rate 18, height $RemoveBe'6\' 2"'VTPplOKxH$  (1.88 m), weight 99.2 kg (218 lb 11.1 oz), SpO2 95 %. Physical Exam: General: pleasant, WD, WN, but disheveled appearing black male who is laying in bed in NAD HEENT: head is normocephalic, atraumatic.  Sclera are noninjected.  Cloudy pupils, but reactive..  Ears and nose without any masses or lesions. NGT in place with clear/brown output.  Mouth is pink and moist Heart: irregular.  Normal s1,s2. No obvious murmurs, gallops, or rubs noted.  Palpable radial and pedal pulses bilaterally Lungs: CTAB, no wheezes, rhonchi, or rales noted.  Respiratory effort nonlabored Abd: soft, NT(had pain meds), ND, +BS, no masses, hernias, or organomegaly MS: all 4 extremities are  symmetrical with no cyanosis, clubbing, or edema. Skin: warm and dry with no masses, lesions, or rashes Psych: A&Ox3 with an appropriate affect.    Results for orders placed or performed during the hospital encounter of 04/19/15 (from the past 48 hour(s))  Lipase, blood     Status: Abnormal   Collection Time: 04/19/15  7:04 PM  Result Value Ref Range   Lipase 17 (L) 22 - 51 U/L  Comprehensive metabolic panel     Status: Abnormal   Collection Time: 04/19/15  7:04 PM  Result Value Ref Range   Sodium 141 135 - 145 mmol/L   Potassium 3.6 3.5 -  5.1 mmol/L   Chloride 101 101 - 111 mmol/L   CO2 30 22 - 32 mmol/L   Glucose, Bld 154 (H) 65 - 99 mg/dL   BUN 11 6 - 20 mg/dL   Creatinine, Ser 1.19 0.61 - 1.24 mg/dL   Calcium 10.3 8.9 - 10.3 mg/dL   Total Protein 7.0 6.5 - 8.1 g/dL   Albumin 3.9 3.5 - 5.0 g/dL   AST 24 15 - 41 U/L   ALT 8 (L) 17 - 63 U/L   Alkaline Phosphatase 60 38 - 126 U/L   Total Bilirubin 1.1 0.3 - 1.2 mg/dL   GFR calc non Af Amer 55 (L) >60 mL/min   GFR calc Af Amer >60 >60 mL/min    Comment: (NOTE) The eGFR has been calculated using the CKD EPI equation. This calculation has not been validated in all clinical situations. eGFR's persistently <60 mL/min signify possible Chronic Kidney Disease.    Anion gap 10 5 - 15  CBC     Status: Abnormal   Collection Time: 04/19/15  7:04 PM  Result Value Ref Range   WBC 12.9 (H) 4.0 - 10.5 K/uL   RBC 5.42 4.22 - 5.81 MIL/uL   Hemoglobin 13.7 13.0 - 17.0 g/dL   HCT 41.6 39.0 - 52.0 %   MCV 76.8 (L) 78.0 - 100.0 fL   MCH 25.3 (L) 26.0 - 34.0 pg   MCHC 32.9 30.0 - 36.0 g/dL   RDW 16.0 (H) 11.5 - 15.5 %   Platelets 177 150 - 400 K/uL  Urinalysis, Routine w reflex microscopic (not at [facility])     Status: Abnormal   Collection Time: 04/20/15 12:03 AM  Result Value Ref Range   Color, Urine YELLOW YELLOW   APPearance TURBID (A) CLEAR   Specific Gravity, Urine 1.023 1.005 - 1.030   pH 8.5 (H) 5.0 - 8.0   Glucose, UA NEGATIVE NEGATIVE mg/dL   Hgb urine dipstick LARGE (A) NEGATIVE   Bilirubin Urine NEGATIVE NEGATIVE   Ketones, ur NEGATIVE NEGATIVE mg/dL   Protein, ur 30 (A) NEGATIVE mg/dL   Urobilinogen, UA 1.0 0.0 - 1.0 mg/dL   Nitrite NEGATIVE NEGATIVE   Leukocytes, UA NEGATIVE NEGATIVE  Urine microscopic-add on     Status: Abnormal   Collection Time: 04/20/15 12:03 AM  Result Value Ref Range   Squamous Epithelial / LPF RARE RARE   RBC / HPF TOO NUMEROUS TO COUNT <3 RBC/hpf   Bacteria, UA MANY (A) RARE   Ct Abdomen Pelvis W Contrast  04/20/2015   CLINICAL DATA:   Generalized abdominal pain onset yesterday. Now with multiple episodes of vomiting.  EXAM: CT ABDOMEN AND PELVIS WITH CONTRAST  TECHNIQUE: Multidetector CT imaging of the abdomen and pelvis was performed using the standard protocol following bolus administration of intravenous contrast.  CONTRAST:  1109mL OMNIPAQUE IOHEXOL 300  MG/ML  SOLN  COMPARISON:  None.  FINDINGS: There is a high-grade small bowel obstruction caused by a 4 x 6 x 8 cm mass of the distal ileum or ileocecal valve. There are enlarged right lower quadrant mesenteric nodes which in some areas appear to be matted together, measuring up to 2 x 3 cm in aggregate and with individual short axis dimensions of approximately 1.1 - 1.5 cm. There is no free intraperitoneal air. The colon is mildly distended with fluid.  There are multiple low-attenuation foci in the liver which more likely represent cysts although the smaller ones cannot be conclusively characterized. There are unremarkable appearances of the spleen, pancreas and adrenals. The left kidney is normal. The right kidney has a mild rotational anomaly and appears rather small but otherwise is unremarkable. No nodules are evident in the lung bases, but there is some mild linear scarring or atelectasis. No significant skeletal lesions are evident.  IMPRESSION: 1. High-grade small bowel obstruction caused by a mass of the distal ileum or ileocecal valve. Leading considerations would be carcinoid, adenocarcinoma, lymphoma. There appear to be involved nodes in the right lower quadrant. 2. Multiple low-attenuation foci in the liver, more likely benign cysts but the smaller ones cannot be conclusively characterized. The largest lesion measures 2.5 cm in the left hepatic lobe and is water density.   Electronically Signed   By: Andreas Newport M.D.   On: 04/20/2015 03:06       Assessment/Plan 1. High grade SBO secondary to mass as ICV -this is likely some type of malignancy.  It would be nice to be  able to scope him to rule out any other lesions, etc, however, he will not be able to be prepped due to his obstruction.  Therefore, we will likely plan on surgical intervention later today if possible for right colectomy.   -the patient does drink about half a pint of liquor a day or if not that then several beers.  He is on CIWA right now.  His platelets are normal as well as his LFTs.  INR is currently pending. -agree with NGT management for now -EKG sinus arrhythmia.  No cardiopulmonary complaints. -will check CEA  Glynis Hunsucker E 04/20/2015, 7:49 AM Pager: [phone]

## 2015-04-20 NOTE — Anesthesia Procedure Notes (Signed)
Procedure Name: Intubation Performed by: Judeth Cornfield T Pre-anesthesia Checklist: Patient identified, Emergency Drugs available, Suction available, Patient being monitored and Timeout performed Patient Re-evaluated:Patient Re-evaluated prior to inductionOxygen Delivery Method: Circle system utilized Preoxygenation: Pre-oxygenation with 100% oxygen Intubation Type: IV induction, Cricoid Pressure applied and Rapid sequence Laryngoscope Size: Mac and 3 Grade View: Grade I Tube type: Oral Tube size: 7.5 mm Number of attempts: 1 Airway Equipment and Method: Stylet (NGT to suction prior to induction) Placement Confirmation: ETT inserted through vocal cords under direct vision,  breath sounds checked- equal and bilateral and positive ETCO2 Secured at: 21 cm Tube secured with: Tape Dental Injury: Teeth and Oropharynx as per pre-operative assessment

## 2015-04-20 NOTE — Consult Note (Signed)
Harold Price 04-22-1930  [id number].   Primary Care MD: none Requesting MD: Dr. Ivor Costa Chief Complaint/Reason for Consult: mass of small bowel, SBO HPI: This is an 81 yo BM who is a [place] who has a history of ETOH abuse, HTN, duodenal ulcer with UGI bleed, who began having some gassy type abdominal pain on Wednesday night at 12 MN.  He states that he then began to have nausea and vomiting, nonbloody.  He last passed flatus or had a BM around 12 noon yesterday.  He denies blood in his stool except 2 years ago when he had his UGI bleed.  He denies any change to his bowel habits recently or his appetite.  He does admit to recent unintentional weight loss, but he is not sure how much.  Due to pain and emesis, he presented to [facility] last night and had a CT scan that reveals a 4x6x8cm mass at his ICV with some surrounding enlarged lymph nodes resulting in a high grade SBO.  The patient was admitted and an NGT was placed.  We have been asked to evaluate the patient for further recommendations.  ROS : Please see HPI, otherwise all other systems are currently negative.  Family History  Problem Relation Age of Onset  . Ulcers Brother     Past Medical History  Diagnosis Date  . Hypertension   . PUD (peptic ulcer disease)     2006  . Depression   . Headache(784.0)   . Arthritis     general  . Alcohol abuse     Past Surgical History  Procedure Laterality Date  . Cataract extraction Right   . Esophagogastroduodenoscopy N/A 11/06/2012    Procedure: ESOPHAGOGASTRODUODENOSCOPY (EGD);  Surgeon: Lear Ng, MD;  Location: [facility] ENDOSCOPY;  Service: Endoscopy;  Laterality: N/A;  . Esophagogastroduodenoscopy N/A 11/07/2012    Procedure: ESOPHAGOGASTRODUODENOSCOPY (EGD);  Surgeon: Lear Ng, MD;  Location: [facility] ENDOSCOPY;  Service: Endoscopy;  Laterality: N/A;    Social History:  reports that he has never smoked. He does not have any smokeless tobacco history on file. He  reports that he drinks about 16.8 oz of alcohol per week. He reports that he does not use illicit drugs.  Allergies: No Known Allergies  Medications Prior to Admission  Medication Sig Dispense Refill  . alum & mag hydroxide-simeth (MAALOX/MYLANTA) 200-200-20 MG/5ML suspension Take 30 mLs by mouth every 6 (six) hours as needed for indigestion or heartburn.    . calcium carbonate (TUMS - DOSED IN MG ELEMENTAL CALCIUM) 500 MG chewable tablet Chew 1 tablet by mouth as needed for heartburn.    . Multiple Vitamin (MULTIVITAMIN WITH MINERALS) TABS Take 1 tablet by mouth daily.      Blood pressure 154/60, pulse 68, temperature 98 F (36.7 C), temperature source Oral, resp. rate 18, height $RemoveBe'6\' 2"'oLZDdQyBH$  (1.88 m), weight 99.2 kg (218 lb 11.1 oz), SpO2 95 %. Physical Exam: General: pleasant, WD, WN, but disheveled appearing black male who is laying in bed in NAD HEENT: head is normocephalic, atraumatic.  Sclera are noninjected.  Cloudy pupils, but reactive..  Ears and nose without any masses or lesions. NGT in place with clear/brown output.  Mouth is pink and moist Heart: irregular.  Normal s1,s2. No obvious murmurs, gallops, or rubs noted.  Palpable radial and pedal pulses bilaterally Lungs: CTAB, no wheezes, rhonchi, or rales noted.  Respiratory effort nonlabored Abd: soft, NT(had pain meds), ND, +BS, no masses, hernias, or organomegaly MS: all 4 extremities are  symmetrical with no cyanosis, clubbing, or edema. Skin: warm and dry with no masses, lesions, or rashes Psych: A&Ox3 with an appropriate affect.    Results for orders placed or performed during the hospital encounter of 04/19/15 (from the past 48 hour(s))  Lipase, blood     Status: Abnormal   Collection Time: 04/19/15  7:04 PM  Result Value Ref Range   Lipase 17 (L) 22 - 51 U/L  Comprehensive metabolic panel     Status: Abnormal   Collection Time: 04/19/15  7:04 PM  Result Value Ref Range   Sodium 141 135 - 145 mmol/L   Potassium 3.6 3.5 -  5.1 mmol/L   Chloride 101 101 - 111 mmol/L   CO2 30 22 - 32 mmol/L   Glucose, Bld 154 (H) 65 - 99 mg/dL   BUN 11 6 - 20 mg/dL   Creatinine, Ser 1.19 0.61 - 1.24 mg/dL   Calcium 10.3 8.9 - 10.3 mg/dL   Total Protein 7.0 6.5 - 8.1 g/dL   Albumin 3.9 3.5 - 5.0 g/dL   AST 24 15 - 41 U/L   ALT 8 (L) 17 - 63 U/L   Alkaline Phosphatase 60 38 - 126 U/L   Total Bilirubin 1.1 0.3 - 1.2 mg/dL   GFR calc non Af Amer 55 (L) >60 mL/min   GFR calc Af Amer >60 >60 mL/min    Comment: (NOTE) The eGFR has been calculated using the CKD EPI equation. This calculation has not been validated in all clinical situations. eGFR's persistently <60 mL/min signify possible Chronic Kidney Disease.    Anion gap 10 5 - 15  CBC     Status: Abnormal   Collection Time: 04/19/15  7:04 PM  Result Value Ref Range   WBC 12.9 (H) 4.0 - 10.5 K/uL   RBC 5.42 4.22 - 5.81 MIL/uL   Hemoglobin 13.7 13.0 - 17.0 g/dL   HCT 41.6 39.0 - 52.0 %   MCV 76.8 (L) 78.0 - 100.0 fL   MCH 25.3 (L) 26.0 - 34.0 pg   MCHC 32.9 30.0 - 36.0 g/dL   RDW 16.0 (H) 11.5 - 15.5 %   Platelets 177 150 - 400 K/uL  Urinalysis, Routine w reflex microscopic (not at [facility])     Status: Abnormal   Collection Time: 04/20/15 12:03 AM  Result Value Ref Range   Color, Urine YELLOW YELLOW   APPearance TURBID (A) CLEAR   Specific Gravity, Urine 1.023 1.005 - 1.030   pH 8.5 (H) 5.0 - 8.0   Glucose, UA NEGATIVE NEGATIVE mg/dL   Hgb urine dipstick LARGE (A) NEGATIVE   Bilirubin Urine NEGATIVE NEGATIVE   Ketones, ur NEGATIVE NEGATIVE mg/dL   Protein, ur 30 (A) NEGATIVE mg/dL   Urobilinogen, UA 1.0 0.0 - 1.0 mg/dL   Nitrite NEGATIVE NEGATIVE   Leukocytes, UA NEGATIVE NEGATIVE  Urine microscopic-add on     Status: Abnormal   Collection Time: 04/20/15 12:03 AM  Result Value Ref Range   Squamous Epithelial / LPF RARE RARE   RBC / HPF TOO NUMEROUS TO COUNT <3 RBC/hpf   Bacteria, UA MANY (A) RARE   Ct Abdomen Pelvis W Contrast  04/20/2015   CLINICAL DATA:   Generalized abdominal pain onset yesterday. Now with multiple episodes of vomiting.  EXAM: CT ABDOMEN AND PELVIS WITH CONTRAST  TECHNIQUE: Multidetector CT imaging of the abdomen and pelvis was performed using the standard protocol following bolus administration of intravenous contrast.  CONTRAST:  174mL OMNIPAQUE IOHEXOL 300  MG/ML  SOLN  COMPARISON:  None.  FINDINGS: There is a high-grade small bowel obstruction caused by a 4 x 6 x 8 cm mass of the distal ileum or ileocecal valve. There are enlarged right lower quadrant mesenteric nodes which in some areas appear to be matted together, measuring up to 2 x 3 cm in aggregate and with individual short axis dimensions of approximately 1.1 - 1.5 cm. There is no free intraperitoneal air. The colon is mildly distended with fluid.  There are multiple low-attenuation foci in the liver which more likely represent cysts although the smaller ones cannot be conclusively characterized. There are unremarkable appearances of the spleen, pancreas and adrenals. The left kidney is normal. The right kidney has a mild rotational anomaly and appears rather small but otherwise is unremarkable. No nodules are evident in the lung bases, but there is some mild linear scarring or atelectasis. No significant skeletal lesions are evident.  IMPRESSION: 1. High-grade small bowel obstruction caused by a mass of the distal ileum or ileocecal valve. Leading considerations would be carcinoid, adenocarcinoma, lymphoma. There appear to be involved nodes in the right lower quadrant. 2. Multiple low-attenuation foci in the liver, more likely benign cysts but the smaller ones cannot be conclusively characterized. The largest lesion measures 2.5 cm in the left hepatic lobe and is water density.   Electronically Signed   By: Andreas Newport M.D.   On: 04/20/2015 03:06       Assessment/Plan 1. High grade SBO secondary to mass as ICV -this is likely some type of malignancy.  It would be nice to be  able to scope him to rule out any other lesions, etc, however, he will not be able to be prepped due to his obstruction.  Therefore, we will likely plan on surgical intervention later today if possible for right colectomy.   -the patient does drink about half a pint of liquor a day or if not that then several beers.  He is on CIWA right now.  His platelets are normal as well as his LFTs.  INR is currently pending. -agree with NGT management for now -EKG sinus arrhythmia.  No cardiopulmonary complaints. -will check CEA  Ramey Schiff E 04/20/2015, 7:49 AM Pager: [phone]

## 2015-04-20 NOTE — ED Notes (Signed)
Pt vomited up his oral CT contrast.

## 2015-04-20 NOTE — Progress Notes (Signed)
Received patient from ED and admitted to room 28.  Patient AOx4, VS stable, pain 4/10,  500 mL NS bolus on-going, hooked to telemtry Box [phone] and CCMD made aware.

## 2015-04-20 NOTE — Anesthesia Postprocedure Evaluation (Signed)
   Anesthesia Post-op Note  Patient: Harold Price  Procedure(s) Performed: Procedure(s) with comments: EXPLORATORY LAPAROTOMY WITH PARTIAL COLECTOMY (N/A) - Exploratory lap with right colectomy, small bowel and sigmoid resection, ileostomy  Patient Location: PACU  Anesthesia Type:General  Level of Consciousness: awake and alert   Airway and Oxygen Therapy: Patient Spontanous Breathing  Post-op Pain: Controlled  Post-op Assessment: Post-op Vital signs reviewed, Patient's Cardiovascular Status Stable and Respiratory Function Stable  Post-op Vital Signs: Reviewed  Filed Vitals:   04/20/15 1606  BP: 114/49  Pulse: 70  Temp: 35.8 C  Resp: 16    Complications: No apparent anesthesia complications

## 2015-04-20 NOTE — ED Notes (Signed)
This RN called CT to determine when pt can be transported to CT. No answer.

## 2015-04-20 NOTE — ED Provider Notes (Signed)
CSN: [id number]     Arrival date & time 04/19/15  1852 History   First MD Initiated Contact with Patient 04/19/15 2100     Chief Complaint  Patient presents with  . Abdominal Pain     (Consider location/radiation/quality/duration/timing/severity/associated sxs/prior Treatment) HPI Comments: 81 year old male with past medical history including hypertension, peptic ulcer disease, depression who presents with abdominal pain. Patient states that last night he began having generalized abdominal pain which has felt like "gas pain." The pain is constant. Around midnight last night he began vomiting and has had multiple episodes of vomiting since then. He endorses constipation but denies any blood in his stool. No black or tarry stools. No chest pain or shortness of breath. No fevers or recent illness.  Patient is a 81 y.o. male presenting with abdominal pain. The history is provided by the patient.  Abdominal Pain   Past Medical History  Diagnosis Date  . Hypertension   . PUD (peptic ulcer disease)     2006  . Depression   . Headache(784.0)   . Arthritis     general   Past Surgical History  Procedure Laterality Date  . Cataract extraction Right   . Esophagogastroduodenoscopy N/A 11/06/2012    Procedure: ESOPHAGOGASTRODUODENOSCOPY (EGD);  Surgeon: Lear Ng, MD;  Location: [facility] ENDOSCOPY;  Service: Endoscopy;  Laterality: N/A;  . Esophagogastroduodenoscopy N/A 11/07/2012    Procedure: ESOPHAGOGASTRODUODENOSCOPY (EGD);  Surgeon: Lear Ng, MD;  Location: [facility] ENDOSCOPY;  Service: Endoscopy;  Laterality: N/A;   Family History  Problem Relation Age of Onset  . Ulcers Brother    History  Substance Use Topics  . Smoking status: Never Smoker   . Smokeless tobacco: Not on file  . Alcohol Use: 16.8 oz/week    28 Cans of beer per week    Review of Systems  Gastrointestinal: Positive for abdominal pain.    10 Systems reviewed and are negative for acute change except as  noted in the HPI.   Allergies  Review of patient's allergies indicates no known allergies.  Home Medications   Prior to Admission medications   Medication Sig Start Date End Date Taking? Authorizing Provider  alum & mag hydroxide-simeth (MAALOX/MYLANTA) 200-200-20 MG/5ML suspension Take 30 mLs by mouth every 6 (six) hours as needed for indigestion or heartburn.   Yes Historical Provider, MD  calcium carbonate (TUMS - DOSED IN MG ELEMENTAL CALCIUM) 500 MG chewable tablet Chew 1 tablet by mouth as needed for heartburn.   Yes Historical Provider, MD  Multiple Vitamin (MULTIVITAMIN WITH MINERALS) TABS Take 1 tablet by mouth daily.   Yes Historical Provider, MD   BP 105/75 mmHg  Pulse 73  Temp(Src) 98.1 F (36.7 C) (Oral)  Resp 20  SpO2 99% Physical Exam  Constitutional: He is oriented to person, place, and time. He appears well-developed and well-nourished. No distress.  HENT:  Head: Normocephalic and atraumatic.  Moist mucous membranes  Eyes: Conjunctivae are normal. Pupils are equal, round, and reactive to light.  Neck: Neck supple.  Cardiovascular: Normal rate, regular rhythm and normal heart sounds.   No murmur heard. Pulmonary/Chest: Effort normal and breath sounds normal.  Abdominal: Soft. Bowel sounds are normal. He exhibits no distension.  Generalized periumbilical and left lower quadrant abdominal tenderness without rebound or guarding  Musculoskeletal: He exhibits no edema.  Neurological: He is alert and oriented to person, place, and time.  Fluent speech  Skin: Skin is warm and dry.  Psychiatric: He has a normal mood and  affect. Judgment normal.  Nursing note and vitals reviewed.   ED Course  Procedures (including critical care time) Labs Review Labs Reviewed  LIPASE, BLOOD - Abnormal; Notable for the following:    Lipase 17 (*)    All other components within normal limits  COMPREHENSIVE METABOLIC PANEL - Abnormal; Notable for the following:    Glucose, Bld 154  (*)    ALT 8 (*)    GFR calc non Af Amer 55 (*)    All other components within normal limits  CBC - Abnormal; Notable for the following:    WBC 12.9 (*)    MCV 76.8 (*)    MCH 25.3 (*)    RDW 16.0 (*)    All other components within normal limits  URINALYSIS, ROUTINE W REFLEX MICROSCOPIC (NOT AT [facility]) - Abnormal; Notable for the following:    APPearance TURBID (*)    pH 8.5 (*)    Hgb urine dipstick LARGE (*)    Protein, ur 30 (*)    All other components within normal limits  URINE MICROSCOPIC-ADD ON - Abnormal; Notable for the following:    Bacteria, UA MANY (*)    All other components within normal limits    Imaging Review No results found.   EKG Interpretation None      Medications  pantoprazole (PROTONIX) EC tablet 40 mg (not administered)  ondansetron (ZOFRAN) injection 4 mg (not administered)  ondansetron (ZOFRAN-ODT) disintegrating tablet 4 mg (4 mg Oral Given 04/19/15 1903)  sodium chloride 0.9 % bolus 500 mL (0 mLs Intravenous Stopped 04/20/15 0201)  alum & mag hydroxide-simeth (MAALOX/MYLANTA) 200-200-20 MG/5ML suspension 30 mL (30 mLs Oral Given 04/20/15 0000)  iohexol (OMNIPAQUE) 300 MG/ML solution 25 mL (25 mLs Oral Contrast Given 04/20/15 0018)    MDM   Final diagnoses:  None    81 yo M w/ PMH including PUD p/w 1 day of generalized abdominal pain associated with vomiting. Patient well-appearing with normal vital signs at presentation. He had periumbilical and left lower quadrant tenderness on exam. Obtained labs listed above which were notable for UA containing bacteria and large amount of blood. He did require I and O cath. Patient denies any urinary symptoms. Because of the patient's left lower quadrant pain, obtained a CT of the abdomen and pelvis to evaluate for diverticulitis or other acute intra-abdominal process including obstructing kidney stone. Gave the patient Zofran and an IV fluid bolus as well as maalox/mylanta and protonix.   I am signing the patient  out to the oncoming attending. The patient's disposition is pending results of his CT scan as well as his improvement in nausea/vomiting.  Sharlett Iles, MD 04/20/15 [phone]

## 2015-04-20 NOTE — ED Notes (Signed)
Dr Miller at bedside. 

## 2015-04-20 NOTE — Progress Notes (Signed)
Patient seen and examined this morning, admitted overnight by Dr. Blaine Hamper, for full details see the H&P.  Briefly, he tells me this morning that he has been having abdominal pain as well as nausea and vomiting for couple of days. CT scan of the admission showed a high-grade small bowel obstruction caused by a mass in the distal ileum or ileocecal valve. General surgery was consulted, and patient will be taken to the operating room later today.   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 He is not sure if he is willing to accept blood products such as albumin or platelets but will ask his family  HTN - not on any meds, monitor  Hx PUD and duodenal ulcer with hemorrhage - IV protonix   Harold Gierke M. Cruzita Lederer, MD [facility] [phone]

## 2015-04-20 NOTE — Transfer of Care (Signed)
Immediate Anesthesia Transfer of Care Note  Patient: Harold Price  Procedure(s) Performed: Procedure(s) with comments: EXPLORATORY LAPAROTOMY WITH PARTIAL COLECTOMY (N/A) - Exploratory lap with right colectomy, small bowel and sigmoid resection, ileostomy  Patient Location: PACU  Anesthesia Type:General  Level of Consciousness: patient cooperative and responds to stimulation  Airway & Oxygen Therapy: Patient Spontanous Breathing and Patient connected to nasal cannula oxygen  Post-op Assessment: Report given to RN, Post -op Vital signs reviewed and stable and Patient moving all extremities X 4  Post vital signs: Reviewed and stable  Last Vitals:  Filed Vitals:   04/20/15 0936  BP: 125/71  Pulse: 73  Temp: 36.4 C  Resp: 19    Complications: No apparent anesthesia complications

## 2015-04-20 NOTE — Progress Notes (Signed)
Returned from PACU at this time to 6n28, denies nausea/pain, sleepy but easily arousable.

## 2015-04-20 NOTE — Interval H&P Note (Signed)
History and Physical Interval Note:  04/20/2015 10:23 AM  Harold Price  has presented today for surgery, with the diagnosis of SBO  The various methods of treatment have been discussed with the patient and family. After consideration of risks, benefits and other options for treatment, the patient has consented to  Procedure(s): EXPLORATORY LAPAROTOMY WITH PARTIAL COLECTOMY (N/A) as a surgical intervention .  The patient's history has been reviewed, patient examined, no change in status, stable for surgery.  I have reviewed the patient's chart and labs.  Questions were answered to the patient's satisfaction.     Kian Ottaviano

## 2015-04-20 NOTE — ED Notes (Signed)
Attempted to call report

## 2015-04-20 NOTE — ED Notes (Signed)
This RN at bedside to place NG tube.  Dr. Blaine Hamper asked this RN to wait until after consultation.

## 2015-04-20 NOTE — Progress Notes (Addendum)
Pt placed on tele  cbg 107

## 2015-04-20 NOTE — ED Provider Notes (Signed)
Patient reexamined, abdominal tenderness with distention and tympanitic sounds to percussion, he has no guarding, he is not vomiting, he is not tachycardic. I have reviewed his CT scan and discuss the findings with the radiologist. He has a high-grade small bowel obstruction with a mass in the terminal ileum and the ileocecal area. The patient is aware of these results as I have just discussed these findings with him. I will place an NG tube, the patient will be admitted to the hospital, discussed with Dr. Grandville Silos of general surgery who requested the patient. Admitted to medical service.  Noemi Chapel, MD 04/20/15 [phone]

## 2015-04-20 NOTE — Progress Notes (Signed)
Pt in OR for laparoscope and will check tomorrow to see if PT re-ordered.  Mee Hives, PT MS Acute Rehab Dept. Number: [facility] [id number] and [place] [phone]

## 2015-04-20 NOTE — H&P (Signed)
[facility] History and Physical  Haneef Hallquist OJJ:[id number] DOB: 1930-03-13 DOA: 04/19/2015  Referring physician: ED physician PCP: No PCP Per Patient  Specialists:   Chief Complaint: Nausea, vomiting, abdominal distention, abdominal pain  HPI: Chandan Fly is a 81 y.o. male with PMH of hypertension, Jehovah's witness, depression, peptic ulcer disease, arthritis, who presents with nausea, vomiting, abdominal distention and abdominal pain.  Patient reports that his symptoms started 2 days ago. He vomited 6-7 times a day today. His abdominal pain is located in the periumbilical area, constant, 5 out of 10 in severity, nonradiating. It is not aggravated or alleviated by any known factors. He does not have symptoms of UTI, no fever, chills, chest pain, shortness breath, diarrhea. No unilateral weakness, leg edema or rashes.  In ED, patient was found to have WBC 12.9, temperature normal, no tachycardia, electrolytes okay, is 19, negative urinalysis. CT abdomen/pelvis showed high-grade small bowel obstruction caused by a mass of the distal ileum or ileocecal valve. Multiple low-attenuation foci in the liver, more likely benign cysts but the smaller ones cannot be conclusively characterized. Patient is admitted to inpatient for further evaluation and treatment. General surgery was consulted by ED.  No blood transfusions as patient is Jehovah's Witness  Where does patient live?   At home  Can patient participate in ADLs?   Some   Review of Systems:   General: no fevers, chills, no changes in body weight, has poor appetite, has fatigue HEENT: no blurry vision, hearing changes or sore throat Pulm: no dyspnea, coughing, wheezing CV: no chest pain, palpitations Abd: has nausea, vomiting, abdominal pain, no diarrhea, constipation GU: no dysuria, burning on urination, increased urinary frequency, hematuria  Ext: no leg edema Neuro: no unilateral weakness, numbness, or tingling, no vision change  or hearing loss Skin: no rash MSK: No muscle spasm, no deformity, no limitation of range of movement in spin Heme: No easy bruising.  Travel history: No recent long distant travel.  Allergy: No Known Allergies  Past Medical History  Diagnosis Date  . Hypertension   . PUD (peptic ulcer disease)     2006  . Depression   . Headache(784.0)   . Arthritis     general  . Alcohol abuse     Past Surgical History  Procedure Laterality Date  . Cataract extraction Right   . Esophagogastroduodenoscopy N/A 11/06/2012    Procedure: ESOPHAGOGASTRODUODENOSCOPY (EGD);  Surgeon: Lear Ng, MD;  Location: [facility] ENDOSCOPY;  Service: Endoscopy;  Laterality: N/A;  . Esophagogastroduodenoscopy N/A 11/07/2012    Procedure: ESOPHAGOGASTRODUODENOSCOPY (EGD);  Surgeon: Lear Ng, MD;  Location: [facility] ENDOSCOPY;  Service: Endoscopy;  Laterality: N/A;    Social History:  reports that he has never smoked. He does not have any smokeless tobacco history on file. He reports that he drinks about 16.8 oz of alcohol per week. He reports that he does not use illicit drugs.  Family History:  Family History  Problem Relation Age of Onset  . Ulcers Brother      Prior to Admission medications   Medication Sig Start Date End Date Taking? Authorizing Provider  alum & mag hydroxide-simeth (MAALOX/MYLANTA) 200-200-20 MG/5ML suspension Take 30 mLs by mouth every 6 (six) hours as needed for indigestion or heartburn.   Yes Historical Provider, MD  calcium carbonate (TUMS - DOSED IN MG ELEMENTAL CALCIUM) 500 MG chewable tablet Chew 1 tablet by mouth as needed for heartburn.   Yes Historical Provider, MD  Multiple Vitamin (MULTIVITAMIN WITH MINERALS) TABS  Take 1 tablet by mouth daily.   Yes Historical Provider, MD    Physical Exam: Filed Vitals:   04/20/15 0015 04/20/15 0145 04/20/15 0245 04/20/15 0315  BP: 142/70 157/67 150/62 168/69  Pulse: 70 66 72 87  Temp:      TempSrc:      Resp: 16     SpO2: 96%  95% 91% 93%   General: Not in acute distress HEENT:       Eyes: PERRL, EOMI, no scleral icterus.       ENT: No discharge from the ears and nose, no pharynx injection, no tonsillar enlargement.        Neck: No JVD, no bruit, no mass felt. Heme: No neck lymph node enlargement. Cardiac: S1/S2, RRR, No murmurs, No gallops or rubs. Pulm: No rales, wheezing, rhonchi or rubs. Abd: distended, diffused tenderness, no rebound pain, no organomegaly, BS present. Ext: No pitting leg edema bilaterally. 2+DP/PT pulse bilaterally. Musculoskeletal: No joint deformities, No joint redness or warmth, no limitation of ROM in spin. Skin: No rashes.  Neuro: Alert, oriented X3, cranial nerves II-XII grossly intact, muscle strength 5/5 in all extremities, sensation to light touch intact.  Psych: Patient is not psychotic, no suicidal or hemocidal ideation.  Labs on Admission:  Basic Metabolic Panel:  Recent Labs Lab 04/19/15 1904  NA 141  K 3.6  CL 101  CO2 30  GLUCOSE 154*  BUN 11  CREATININE 1.19  CALCIUM 10.3   Liver Function Tests:  Recent Labs Lab 04/19/15 1904  AST 24  ALT 8*  ALKPHOS 60  BILITOT 1.1  PROT 7.0  ALBUMIN 3.9    Recent Labs Lab 04/19/15 1904  LIPASE 17*   No results for input(s): AMMONIA in the last 168 hours. CBC:  Recent Labs Lab 04/19/15 1904  WBC 12.9*  HGB 13.7  HCT 41.6  MCV 76.8*  PLT 177   Cardiac Enzymes: No results for input(s): CKTOTAL, CKMB, CKMBINDEX, TROPONINI in the last 168 hours.  BNP (last 3 results) No results for input(s): BNP in the last 8760 hours.  ProBNP (last 3 results) No results for input(s): PROBNP in the last 8760 hours.  CBG: No results for input(s): GLUCAP in the last 168 hours.  Radiological Exams on Admission: Ct Abdomen Pelvis W Contrast  04/20/2015   CLINICAL DATA:  Generalized abdominal pain onset yesterday. Now with multiple episodes of vomiting.  EXAM: CT ABDOMEN AND PELVIS WITH CONTRAST  TECHNIQUE:  Multidetector CT imaging of the abdomen and pelvis was performed using the standard protocol following bolus administration of intravenous contrast.  CONTRAST:  196mL OMNIPAQUE IOHEXOL 300 MG/ML  SOLN  COMPARISON:  None.  FINDINGS: There is a high-grade small bowel obstruction caused by a 4 x 6 x 8 cm mass of the distal ileum or ileocecal valve. There are enlarged right lower quadrant mesenteric nodes which in some areas appear to be matted together, measuring up to 2 x 3 cm in aggregate and with individual short axis dimensions of approximately 1.1 - 1.5 cm. There is no free intraperitoneal air. The colon is mildly distended with fluid.  There are multiple low-attenuation foci in the liver which more likely represent cysts although the smaller ones cannot be conclusively characterized. There are unremarkable appearances of the spleen, pancreas and adrenals. The left kidney is normal. The right kidney has a mild rotational anomaly and appears rather small but otherwise is unremarkable. No nodules are evident in the lung bases, but there is some mild  linear scarring or atelectasis. No significant skeletal lesions are evident.  IMPRESSION: 1. High-grade small bowel obstruction caused by a mass of the distal ileum or ileocecal valve. Leading considerations would be carcinoid, adenocarcinoma, lymphoma. There appear to be involved nodes in the right lower quadrant. 2. Multiple low-attenuation foci in the liver, more likely benign cysts but the smaller ones cannot be conclusively characterized. The largest lesion measures 2.5 cm in the left hepatic lobe and is water density.   Electronically Signed   By: Andreas Newport M.D.   On: 04/20/2015 03:06    EKG: Not done in ED, will get one.   Assessment/Plan Principal Problem:   Small bowel obstruction Active Problems:   Alcohol abuse   Refusal of blood transfusions as patient is Jehovah's Witness   Duodenal ulcer with hemorrhage   Hypertension   PUD (peptic  ulcer disease)   Depression   Small bowel mass   Leukocytosis   Small bowel obstruction secondary to small bowel mass: As evidenced by CT abdomen/pelvis. Currently patient is not septic, but has leukocytosis. Hemodynamically stable. General surgery was consulted. -admit to tele  -NPO  -NG tube  -morphine prn pain  -IVF:  500cc x 2 and then 100 cc/h -Zofran prn nausea  -INR/PTT/type & screen -will get Procalcitonin and trend lactic acid  Alcohol abuse: -Did counseling about the importance of quitting drinking -CIWA protocol  Hx of PUD and duodenal ulcer with hemorrhage: not taking PPI at home -start IV protonix   Hypetension: No taking medications. Bp=105/75 on admission. -IV prn hydralazine  Depression: Not taking med at home. Stable, no suicidal or homicidal ideations. -Observe closely  Leukocytosis: No signs of infection. No fever. Likely due to reactive leukocytosis. -follow up CBC -will get blood culture if develops fever -hold ABx now, but wills start Abx if develops fever or any signs of sepsis.  DVT ppx: SCD  Code Status: Full code Family Communication: None at bed side.  Disposition Plan: Admit to inpatient   Date of Service 04/20/2015    Ivor Costa [facility] Pager [phone]  If 7PM-7AM, please contact night-coverage [email or web address] Password [email or web address] 04/20/2015, 3:56 AM

## 2015-04-21 DIAGNOSIS — F329 Major depressive disorder, single episode, unspecified: Secondary | ICD-10-CM

## 2015-04-21 DIAGNOSIS — D72829 Elevated white blood cell count, unspecified: Secondary | ICD-10-CM

## 2015-04-21 LAB — CBC
HCT: 36.3 % — ABNORMAL LOW (ref 39.0–52.0)
Hemoglobin: 11.4 g/dL — ABNORMAL LOW (ref 13.0–17.0)
MCH: 24.9 pg — ABNORMAL LOW (ref 26.0–34.0)
MCHC: 31.4 g/dL (ref 30.0–36.0)
MCV: 79.3 fL (ref 78.0–100.0)
PLATELETS: 158 10*3/uL (ref 150–400)
RBC: 4.58 MIL/uL (ref 4.22–5.81)
RDW: 16.7 % — ABNORMAL HIGH (ref 11.5–15.5)
WBC: 13.8 10*3/uL — ABNORMAL HIGH (ref 4.0–10.5)

## 2015-04-21 LAB — GLUCOSE, CAPILLARY: Glucose-Capillary: 123 mg/dL — ABNORMAL HIGH (ref 65–99)

## 2015-04-21 LAB — COMPREHENSIVE METABOLIC PANEL
ALBUMIN: 2.6 g/dL — AB (ref 3.5–5.0)
ALK PHOS: 37 U/L — AB (ref 38–126)
ALT: 8 U/L — ABNORMAL LOW (ref 17–63)
AST: 21 U/L (ref 15–41)
Anion gap: 8 (ref 5–15)
BILIRUBIN TOTAL: 0.9 mg/dL (ref 0.3–1.2)
BUN: 15 mg/dL (ref 6–20)
CALCIUM: 8.1 mg/dL — AB (ref 8.9–10.3)
CO2: 27 mmol/L (ref 22–32)
Chloride: 107 mmol/L (ref 101–111)
Creatinine, Ser: 1.47 mg/dL — ABNORMAL HIGH (ref 0.61–1.24)
GFR calc non Af Amer: 43 mL/min — ABNORMAL LOW (ref >60)
GFR, EST AFRICAN AMERICAN: 49 mL/min — AB (ref >60)
GLUCOSE: 151 mg/dL — AB (ref 65–99)
Potassium: 4 mmol/L (ref 3.5–5.1)
SODIUM: 142 mmol/L (ref 135–145)
Total Protein: 4.6 g/dL — ABNORMAL LOW (ref 6.5–8.1)

## 2015-04-21 LAB — CEA: CEA: 2.4 ng/mL (ref 0.0–4.7)

## 2015-04-21 MED ORDER — SODIUM CHLORIDE 0.9 % IV BOLUS (SEPSIS)
500.0000 mL | Freq: Once | INTRAVENOUS | Status: AC
  Administered 2015-04-21: 500 mL via INTRAVENOUS

## 2015-04-21 MED ORDER — ACETAMINOPHEN 10 MG/ML IV SOLN
1000.0000 mg | Freq: Four times a day (QID) | INTRAVENOUS | Status: AC
  Administered 2015-04-21 – 2015-04-22 (×4): 1000 mg via INTRAVENOUS
  Filled 2015-04-21 (×4): qty 100

## 2015-04-21 MED ORDER — DEXTROSE 5 % IV SOLN
500.0000 mg | Freq: Three times a day (TID) | INTRAVENOUS | Status: DC | PRN
  Administered 2015-04-21: 500 mg via INTRAVENOUS
  Filled 2015-04-21: qty 5

## 2015-04-21 NOTE — Care Management Note (Signed)
Case Management Note  Patient Details  Name: Harold Price MRN: [id number] Date of Birth: [birth year]  Subjective/Objective:                    Action/Plan:   Expected Discharge Date:                  Expected Discharge Plan:     In-House Referral:     Discharge planning Services     Post Acute Care Choice:    Choice offered to:     DME Arranged:    DME Agency:     HH Arranged:    [place] Agency:     Status of Service:     Medicare Important Message Given:   yes Date Medicare IM Given:   04/21/15 Medicare IM give by:   Frann Rider, RN, BSN Date Additional Medicare IM Given:    Additional Medicare Important Message give by:     If discussed at [place] of Stay Meetings, dates discussed:    Additional Comments:  Norina Buzzard, RN 04/21/2015, 3:24 PM

## 2015-04-21 NOTE — Progress Notes (Signed)
[facility] Progress Note  1 Day Post-Op  Subjective: Pt doing okay, pain well controlled.  No N/V.  Not been OOB yet.  Using IS to 500.  No flatus or BM in ileostomy bag yet.    Objective: Vital signs in last 24 hours: Temp:  [96.5 F (35.8 C)-98.5 F (36.9 C)] 98.1 F (36.7 C) (08/06 0426) Pulse Rate:  [65-87] 87 (08/06 0426) Resp:  [14-19] 19 (08/06 0426) BP: (92-132)/(40-71) 128/52 mmHg (08/06 0426) SpO2:  [94 %-99 %] 97 % (08/06 0426) Last BM Date: 04/19/15  Intake/Output from previous day: 08/05 0701 - 08/06 0700 In: 4188 [I.V.:3688; IV Piggyback:500] Out: 1695 [Urine:785; Emesis/NG output:200; Stool:10; Blood:500] Intake/Output this shift:    PE: Gen:  Alert, NAD, pleasant Abd: Soft, mild distension, mild tenderness over incision and ostomy site, diminished BS, no HSM, midline incision with sanguinous drainage on honeycomb, ostomy pink with sanguinous drainage out of bag.     Lab Results:   Recent Labs  04/20/15 0730 04/21/15 0317  WBC 11.6* 13.8*  HGB 13.1 11.4*  HCT 41.7 36.3*  PLT 155 158   BMET  Recent Labs  04/20/15 0730 04/21/15 0317  NA 139 142  K 3.5 4.0  CL 101 107  CO2 28 27  GLUCOSE 127* 151*  BUN 9 15  CREATININE 1.11 1.47*  CALCIUM 9.3 8.1*   PT/INR  Recent Labs  04/20/15 0730  LABPROT 13.8  INR 1.04   CMP     Component Value Date/Time   NA 142 04/21/2015 0317   K 4.0 04/21/2015 0317   CL 107 04/21/2015 0317   CO2 27 04/21/2015 0317   GLUCOSE 151* 04/21/2015 0317   BUN 15 04/21/2015 0317   CREATININE 1.47* 04/21/2015 0317   CALCIUM 8.1* 04/21/2015 0317   PROT 4.6* 04/21/2015 0317   ALBUMIN 2.6* 04/21/2015 0317   AST 21 04/21/2015 0317   ALT 8* 04/21/2015 0317   ALKPHOS 37* 04/21/2015 0317   BILITOT 0.9 04/21/2015 0317   GFRNONAA 43* 04/21/2015 0317   GFRAA 49* 04/21/2015 0317   Lipase     Component Value Date/Time   LIPASE 17* 04/19/2015 1904       Studies/Results: Ct Abdomen Pelvis W  Contrast  04/20/2015   CLINICAL DATA:  Generalized abdominal pain onset yesterday. Now with multiple episodes of vomiting.  EXAM: CT ABDOMEN AND PELVIS WITH CONTRAST  TECHNIQUE: Multidetector CT imaging of the abdomen and pelvis was performed using the standard protocol following bolus administration of intravenous contrast.  CONTRAST:  150mL OMNIPAQUE IOHEXOL 300 MG/ML  SOLN  COMPARISON:  None.  FINDINGS: There is a high-grade small bowel obstruction caused by a 4 x 6 x 8 cm mass of the distal ileum or ileocecal valve. There are enlarged right lower quadrant mesenteric nodes which in some areas appear to be matted together, measuring up to 2 x 3 cm in aggregate and with individual short axis dimensions of approximately 1.1 - 1.5 cm. There is no free intraperitoneal air. The colon is mildly distended with fluid.  There are multiple low-attenuation foci in the liver which more likely represent cysts although the smaller ones cannot be conclusively characterized. There are unremarkable appearances of the spleen, pancreas and adrenals. The left kidney is normal. The right kidney has a mild rotational anomaly and appears rather small but otherwise is unremarkable. No nodules are evident in the lung bases, but there is some mild linear scarring or atelectasis. No significant skeletal lesions are evident.  IMPRESSION: 1. High-grade small bowel obstruction caused by a mass of the distal ileum or ileocecal valve. Leading considerations would be carcinoid, adenocarcinoma, lymphoma. There appear to be involved nodes in the right lower quadrant. 2. Multiple low-attenuation foci in the liver, more likely benign cysts but the smaller ones cannot be conclusively characterized. The largest lesion measures 2.5 cm in the left hepatic lobe and is water density.   Electronically Signed   By: Andreas Newport M.D.   On: 04/20/2015 03:06    Anti-infectives: Anti-infectives    Start     Dose/Rate Route Frequency Ordered Stop    04/21/15 1100  cefoTEtan (CEFOTAN) 2 g in dextrose 5 % 50 mL IVPB  Status:  Discontinued     2 g 100 mL/hr over 30 Minutes Intravenous  Once 04/20/15 1601 04/20/15 1658   04/20/15 1000  [MAR Hold]  cefoTEtan (CEFOTAN) 2 g in dextrose 5 % 50 mL IVPB     (MAR Hold since 04/20/15 0954)   2 g 100 mL/hr over 30 Minutes Intravenous To [facility] 04/20/15 0859 04/20/15 1115       Assessment/Plan SBO with ileocecal mass POD #1 s/p subtotal colectomy, including terminal ileum, end ileostomy  -NPO, IVF, pain control, antiemetics,  -No more antibiotics needed -Ambulate and IS -SCD's and SQ heparin -Await bowel function prior to starting diet -Await pathology -Add ice and robaxin  Leucocytosis - 13.8 Jehovah's Witness Etoh - CIWA    LOS: 1 day    Nat Christen 04/21/2015, 7:55 AM Pager: [phone]

## 2015-04-21 NOTE — Progress Notes (Addendum)
[facility] PROGRESS NOTE  Harold Price PZW:[id number] DOB: 1930/03/27 DOA: 04/19/2015 PCP: No PCP Per Patient HPI/Subjective: [name] PMHx Depression,HTN, ETOH Abuse, PUD, Jehovah's witness,   Presents with nausea, vomiting, abdominal distention and abdominal pain.  Patient reports that his symptoms started 2 days ago. He vomited 6-7 times a day today. His abdominal pain is located in the periumbilical area, constant, 5 out of 10 in severity, nonradiating. It is not aggravated or alleviated by any known factors. He does not have symptoms of UTI, no fever, chills, chest pain, shortness breath, diarrhea. No unilateral weakness, leg edema or rashes.  In ED, patient was found to have WBC 12.9, temperature normal, no tachycardia, electrolytes okay, is 19, negative urinalysis. CT abdomen/pelvis showed high-grade small bowel obstruction caused by a mass of the distal ileum or ileocecal valve. Multiple low-attenuation foci in the liver, more likely benign cysts but the smaller ones cannot be conclusively characterized. Patient is admitted to inpatient for further evaluation and treatment. General surgery was consulted by ED. 8/6 A/O 4, patient confirms that he is a Jehovah's Witness and will not take any blood products. Negative SOB, negative CP, some abdominal discomfort secondary to surgery.    Assessment/Plan:  Small bowel obstruction secondary to small bowel mass: -Pathology pending - As evidenced by CT abdomen/pelvis. Currently patient is not septic, but has leukocytosis. Hemodynamically stable. General surgery was consulted. -admit to tele  -NPO  -NG tube  -morphine prn pain  -Continue Normal saline 125 ml/hr  Leukocytosis:  -Afebrile overnight most likely stress Demargination, will monitor closely -follow up CBC -will get blood culture if develops fever -hold ABx now, but wills start Abx if develops fever or any signs of sepsis.  Alcohol abuse: -Did counseling about the  importance of quitting drinking -CIWA protocol -Echocardiogram; patient will require large quantities of fluids R/O alcoholic cardiomyopathy  Hx of PUD and duodenal ulcer with hemorrhage: -Continue IV protonix 40 mg daily  Hypetension/hypotension: -Patient currently hypotensive, continue to hydrate   Depression:  -Not taking med at home. Stable, no suicidal or homicidal ideations. -Observe closely    Code Status: Full Family Communication: None  Disposition Plan: per surgery   Consultants:  Dr.Matthew Donne Hazel ([facility])    Procedures: 8/5 :Small bowel obstruction, ileocecal mass   Cultures   Antibiotics: NA   DVT prophylaxis SCD    Objective: Filed Vitals:   04/21/15 1100 04/21/15 1450 04/21/15 2132 04/22/15 0509  BP: 139/63 119/50 135/50 129/51  Pulse: 98 86 86 86  Temp: 97.6 F (36.4 C) 97.6 F (36.4 C) 98.1 F (36.7 C) 98.2 F (36.8 C)  TempSrc: Oral Oral Oral Oral  Resp: 20 18 19 19   Height:      Weight:      SpO2: 100% 94% 96% 95%    Intake/Output Summary (Last 24 hours) at 04/22/15 0701 Last data filed at 04/22/15 4235  Gross per 24 hour  Intake   2525 ml  Output   1410 ml  Net   1115 ml   Filed Weights   04/20/15 0445  Weight: 99.2 kg (218 lb 11.1 oz)     Exam: General: A/O 4, appropriate abdominal pain for recent surgery, No acute respiratory distress Eyes: Negative headache, eye pain, double vision,negative scleral hemorrhage ENT: Negative Runny nose, negative ear pain, negative tinnitus, negative gingival bleeding, Neck:  Negative scars, masses, torticollis, lymphadenopathy, JVD Lungs: Clear to auscultation bilaterally without wheezes or crackles Cardiovascular: Regular rate and rhythm without murmur gallop or  rub normal S1 and S2 Abdomen: Positive mild abdominal pain, negative dysphagia, nondistended, positive soft, bowel sounds, no rebound, no ascites, no appreciable mass, surgical dressing in place with serous fluid coating  about 1/2 way up. Colostomy bag in place, stomal pain viable. Extremities: No significant cyanosis, clubbing, or edema bilateral lower extremities Psychiatric:  Negative depression, negative anxiety, negative fatigue, negative mania  Neurologic:  Cranial nerves II through XII intact, tongue/uvula midline, all extremities muscle strength 5/5, sensation intact throughout, negative dysarthria, negative expressive aphasia, negative receptive aphasia.     Data Reviewed: Basic Metabolic Panel:  Recent Labs Lab 04/19/15 1904 04/20/15 0730 04/21/15 0317  NA 141 139 142  K 3.6 3.5 4.0  CL 101 101 107  CO2 30 28 27   GLUCOSE 154* 127* 151*  BUN 11 9 15   CREATININE 1.19 1.11 1.47*  CALCIUM 10.3 9.3 8.1*   Liver Function Tests:  Recent Labs Lab 04/19/15 1904 04/20/15 0730 04/21/15 0317  AST 24 17 21   ALT 8* 6* 8*  ALKPHOS 60 56 37*  BILITOT 1.1 0.6 0.9  PROT 7.0 6.5 4.6*  ALBUMIN 3.9 3.7 2.6*    Recent Labs Lab 04/19/15 1904  LIPASE 17*   No results for input(s): AMMONIA in the last 168 hours. CBC:  Recent Labs Lab 04/19/15 1904 04/20/15 0730 04/21/15 0317  WBC 12.9* 11.6* 13.8*  HGB 13.7 13.1 11.4*  HCT 41.6 41.7 36.3*  MCV 76.8* 77.7* 79.3  PLT 177 155 158   Cardiac Enzymes: No results for input(s): CKTOTAL, CKMB, CKMBINDEX, TROPONINI in the last 168 hours. BNP (last 3 results) No results for input(s): BNP in the last 8760 hours.  ProBNP (last 3 results) No results for input(s): PROBNP in the last 8760 hours.  CBG:  Recent Labs Lab 04/20/15 0746 04/20/15 1010 04/20/15 1431 04/21/15 0804  GLUCAP 101* 107* 153* 123*    No results found for this or any previous visit (from the past 240 hour(s)).   Studies: No results found.  Scheduled Meds: . antiseptic oral rinse  7 mL Mouth Rinse q12n4p  . chlorhexidine gluconate  15 mL Mouth Rinse BID  . folic acid  1 mg Oral Daily  . heparin  5,000 Units Subcutaneous 3 times per day  . LORazepam  0-4 mg  Intravenous Q12H  . multivitamin with minerals  1 tablet Oral Daily  . pantoprazole (PROTONIX) IV  40 mg Intravenous Q24H  . pneumococcal 23 valent vaccine  0.5 mL Intramuscular Tomorrow-1000  . sodium chloride  3 mL Intravenous Q12H  . thiamine  100 mg Oral Daily   Or  . thiamine  100 mg Intravenous Daily   Continuous Infusions: . sodium chloride 125 mL/hr at 04/22/15 0520  . lactated ringers 10 mL/hr at 04/20/15 1003    Principal Problem:   Small bowel obstruction Active Problems:   Alcohol abuse   Refusal of blood transfusions as patient is Jehovah's Witness   Duodenal ulcer with hemorrhage   Hypertension   PUD (peptic ulcer disease)   Depression   Small bowel mass   Leukocytosis   Cancer of abdominal organ    Time spent: 40 minutes   Icela Glymph, [facility] Pager [phone]. If 7PM-7AM, please contact night-coverage at [email or web address], password [facility] 04/22/2015, 7:01 AM  LOS: 2 days    Care during the described time interval was provided by me .  I have reviewed this patient's available data, including medical history, events of note, physical examination, and all test  results as part of my evaluation. I have personally reviewed and interpreted all radiology studies.   Dia Crawford, MD [phone] Pager

## 2015-04-21 NOTE — Progress Notes (Signed)
Utilization review completed.  

## 2015-04-21 NOTE — Evaluation (Signed)
Physical Therapy Evaluation Patient Details Name: Harold Price MRN: [id number] DOB: [birth year] Today's Date: 04/21/2015   History of Present Illness  81 y.o. male s/p subtotal colectomy, including terminal ileum, end ileostomy   Clinical Impression  Patient is s/p above surgery presenting with functional limitations due to the deficits listed below (see PT Problem List). Previously independent at home, Harold Price now requires min guard-min assist to safely mobilize. Using a rolling walker for support to ambulate this date. Family present and supportive however, do not feel they can provide adequate care during his recovery. Favor ST-SNF to improve patient's functional independence prior to returning home. Patient will benefit from skilled PT to increase their independence and safety with mobility to allow discharge to the venue listed below.       Follow Up Recommendations SNF (Requests [facility])    Equipment Recommendations   (TBD next venue of care)    Recommendations for Other Services       Precautions / Restrictions Precautions Precautions: Fall Restrictions Weight Bearing Restrictions: No      Mobility  Bed Mobility               General bed mobility comments: sitting in chair  Transfers Overall transfer level: Needs assistance Equipment used: Rolling walker (2 wheeled) Transfers: Sit to/from Stand Sit to Stand: Min assist         General transfer comment: Min assist for boost to stand. VC for hand placement and to scoot to edge of chair prior to attempting to stand.  Ambulation/Gait Ambulation/Gait assistance: Min guard Ambulation Distance (Feet): 90 Feet Assistive device: Rolling walker (2 wheeled) Gait Pattern/deviations: Step-through pattern;Decreased stride length;Trunk flexed Gait velocity: slow   General Gait Details: Educated on safe DME use with a rolling walker. Intermittent cues for upright posture. VC to increase stride length as it is  very short, however not shuffling at this time.  Stairs            Wheelchair Mobility    Modified Rankin (Stroke Patients Only)       Balance Overall balance assessment: Needs assistance Sitting-balance support: No upper extremity supported;Feet supported Sitting balance-Leahy Scale: Good     Standing balance support: No upper extremity supported Standing balance-Leahy Scale: Fair                               Pertinent Vitals/Pain Pain Assessment: 0-10 Pain Score: 5  Pain Location: abdomen Pain Descriptors / Indicators: Dull Pain Intervention(s): Monitored during session;Repositioned    Home Living Family/patient expects to be discharged to:: Private residence Living Arrangements: Spouse/significant other;Children Available Help at Discharge: Family Type of Home: House Home Access: Ramped entrance     Home Layout: One level Home Equipment: Environmental consultant - 2 wheels;Shower seat      Prior Function Level of Independence: Independent               Hand Dominance   Dominant Hand: Right    Extremity/Trunk Assessment   Upper Extremity Assessment: Defer to OT evaluation           Lower Extremity Assessment: Generalized weakness         Communication   Communication: No difficulties  Cognition Arousal/Alertness: Awake/alert Behavior During Therapy: Flat affect Overall Cognitive Status: Within Functional Limits for tasks assessed                      General  Comments General comments (skin integrity, edema, etc.): grand daughter in room. States she does not feel family can adequately care for patient safely at home    Exercises        Assessment/Plan    PT Assessment Patient needs continued PT services  PT Diagnosis Abnormality of gait;Generalized weakness;Acute pain   PT Problem List Decreased strength;Decreased activity tolerance;Decreased balance;Decreased mobility;Decreased knowledge of use of DME;Pain  PT  Treatment Interventions DME instruction;Gait training;Functional mobility training;Therapeutic activities;Therapeutic exercise;Balance training;Neuromuscular re-education;Patient/family education;Modalities   PT Goals (Current goals can be found in the Care Plan section) Acute Rehab PT Goals Patient Stated Goal: Get better so I can go home again PT Goal Formulation: With patient Time For Goal Achievement: 05/05/15 Potential to Achieve Goals: Good    Frequency Min 3X/week   Barriers to discharge Decreased caregiver support Granddaughter does not feel family can support pt at this time while he recovers. Was fully independent prior to admission    Co-evaluation               End of Session Equipment Utilized During Treatment: Gait belt Activity Tolerance: Patient tolerated treatment well Patient left: in chair;with call bell/phone within reach;with family/visitor present Nurse Communication: Mobility status         Time: 9470-9628 PT Time Calculation (min) (ACUTE ONLY): 17 min   Charges:   PT Evaluation $Initial PT Evaluation Tier I: 1 Procedure     PT G CodesEllouise Price 04/21/2015, 4:52 PM Harold Price Harold Price, [place]

## 2015-04-22 ENCOUNTER — Inpatient Hospital Stay (HOSPITAL_COMMUNITY): Payer: Medicare Other

## 2015-04-22 DIAGNOSIS — I425 Other restrictive cardiomyopathy: Secondary | ICD-10-CM

## 2015-04-22 LAB — BASIC METABOLIC PANEL
Anion gap: 7 (ref 5–15)
BUN: 15 mg/dL (ref 6–20)
CALCIUM: 8.2 mg/dL — AB (ref 8.9–10.3)
CHLORIDE: 110 mmol/L (ref 101–111)
CO2: 24 mmol/L (ref 22–32)
CREATININE: 1.18 mg/dL (ref 0.61–1.24)
GFR calc Af Amer: >60 mL/min (ref >60)
GFR, EST NON AFRICAN AMERICAN: 56 mL/min — AB (ref >60)
GLUCOSE: 85 mg/dL (ref 65–99)
Potassium: 4.2 mmol/L (ref 3.5–5.1)
Sodium: 141 mmol/L (ref 135–145)

## 2015-04-22 LAB — GLUCOSE, CAPILLARY: GLUCOSE-CAPILLARY: 92 mg/dL (ref 65–99)

## 2015-04-22 MED ORDER — ENOXAPARIN SODIUM 40 MG/0.4ML ~~LOC~~ SOLN
40.0000 mg | SUBCUTANEOUS | Status: DC
  Administered 2015-04-22 – 2015-05-02 (×11): 40 mg via SUBCUTANEOUS
  Filled 2015-04-22 (×12): qty 0.4

## 2015-04-22 MED ORDER — METOPROLOL TARTRATE 1 MG/ML IV SOLN
5.0000 mg | Freq: Four times a day (QID) | INTRAVENOUS | Status: DC | PRN
  Administered 2015-04-22 – 2015-04-27 (×2): 5 mg via INTRAVENOUS
  Filled 2015-04-22 (×2): qty 5

## 2015-04-22 MED ORDER — DILTIAZEM HCL 100 MG IV SOLR
5.0000 mg/h | INTRAVENOUS | Status: DC
  Administered 2015-04-22: 7.5 mg/h via INTRAVENOUS
  Administered 2015-04-22: 5 mg/h via INTRAVENOUS
  Administered 2015-04-22 – 2015-04-23 (×2): 10 mg/h via INTRAVENOUS
  Filled 2015-04-22: qty 100

## 2015-04-22 MED ORDER — METOPROLOL TARTRATE 1 MG/ML IV SOLN
INTRAVENOUS | Status: AC
  Filled 2015-04-22: qty 5

## 2015-04-22 NOTE — Progress Notes (Addendum)
[facility] PROGRESS NOTE  Gearold Wainer KYH:[id number] DOB: 12-Apr-1930 DOA: 04/19/2015 PCP: No PCP Per Patient HPI/Subjective: [name] PMHx Depression,HTN, ETOH Abuse, PUD, Jehovah's witness,   Presents with nausea, vomiting, abdominal distention and abdominal pain.  Patient reports that his symptoms started 2 days ago. He vomited 6-7 times a day today. His abdominal pain is located in the periumbilical area, constant, 5 out of 10 in severity, nonradiating. It is not aggravated or alleviated by any known factors. He does not have symptoms of UTI, no fever, chills, chest pain, shortness breath, diarrhea. No unilateral weakness, leg edema or rashes.  In ED, patient was found to have WBC 12.9, temperature normal, no tachycardia, electrolytes okay, is 19, negative urinalysis. CT abdomen/pelvis showed high-grade small bowel obstruction caused by a mass of the distal ileum or ileocecal valve. Multiple low-attenuation foci in the liver, more likely benign cysts but the smaller ones cannot be conclusively characterized. Patient is admitted to inpatient for further evaluation and treatment. General surgery was consulted by ED. 8/6 A/O 4, patient confirms that he is a Jehovah's Witness and will not take any blood products. Negative SOB, negative CP, some abdominal discomfort secondary to surgery. 8/7 afebrile overnight, negative CP, negative SOB, negative N/V. States only concern is when coughing positive abdominal pain. Sitting in chair comfortably    Assessment/Plan: Small bowel obstruction secondary to small bowel mass: -Pathology pending -NPO  -NG tube  -morphine prn pain  -Continue Normal saline 125 ml/hr -Strict in and out since admission + 3.2 L -If patient has not started on diet by 8/8, and still not septic will need to consider starting TPN; will consult with surgery prior to initiating.  Leukocytosis:  -Afebrile overnight most likely stress Demargination, will monitor  closely -follow up CBC -will get blood culture if develops fever -Continue to hold ABx now, but will start Abx if develops fever or any signs of sepsis.  Alcohol abuse: -Did counseling about the importance of quitting drinking -CIWA protocol -Echocardiogram; pending  Hx of PUD and duodenal ulcer with hemorrhage: -Continue IV protonix 40 mg daily  Hypetension/hypotension: -Improved with hydration. Would continue hydration until patient able to begin consuming PO   Depression:  -Not taking med at home. Stable, no suicidal or homicidal ideations. -Observe closely  ADDENDUM; Paged to bedside and Pt now in A-Fib with RVR A-Fib RVR - Start Metoprolol IV 5mg  PRN HR >100. Hold if SBP<90 or MAP< 63 - Pt asymptomatic -NOTE at approximately [place] notified that patient was still in A. fib RVR after initial maximum treatment allowed for this ward. They will call Rapid Response. In order to start the Cardizem drip. -Transfer to [facility]     Code Status: Full Family Communication: None  Disposition Plan: per surgery   Consultants:  Dr.Matthew Donne Hazel ([place])    Procedures: 8/5 :Small bowel obstruction, ileocecal mass   Cultures   Antibiotics: NA   DVT prophylaxis SCD    Objective: Filed Vitals:   04/21/15 1100 04/21/15 1450 04/21/15 2132 04/22/15 0509  BP: 139/63 119/50 135/50 129/51  Pulse: 98 86 86 86  Temp: 97.6 F (36.4 C) 97.6 F (36.4 C) 98.1 F (36.7 C) 98.2 F (36.8 C)  TempSrc: Oral Oral Oral Oral  Resp: 20 18 19 19   Height:      Weight:      SpO2: 100% 94% 96% 95%    Intake/Output Summary (Last 24 hours) at 04/22/15 0919 Last data filed at 04/22/15 1517  Gross per  24 hour  Intake   2525 ml  Output   1410 ml  Net   1115 ml   Filed Weights   04/20/15 0445  Weight: 99.2 kg (218 lb 11.1 oz)     Exam: General: A/O 4, appropriate abdominal pain for recent surgery, No acute respiratory distress Eyes: Negative headache, eye pain, double  vision,negative scleral hemorrhage ENT: Negative Runny nose, negative ear pain, negative tinnitus, negative gingival bleeding, Neck:  Negative scars, masses, torticollis, lymphadenopathy, JVD Lungs: Clear to auscultation bilaterally without wheezes or crackles Cardiovascular: Regular rate and rhythm without murmur gallop or rub normal S1 and S2 Abdomen: Positive mild abdominal pain, negative dysphagia, nondistended, positive soft, bowel sounds, no rebound, no ascites, no appreciable mass, surgical dressing in place with old sanguineous fluid coating about 1/2 way up. Colostomy bag in place, stomal pain viable. Extremities: No significant cyanosis, clubbing, or edema bilateral lower extremities Psychiatric:  Negative depression, negative anxiety, negative fatigue, negative mania  Neurologic:  Cranial nerves II through XII intact, tongue/uvula midline, all extremities muscle strength 5/5, sensation intact throughout, negative dysarthria, negative expressive aphasia, negative receptive aphasia.     Data Reviewed: Basic Metabolic Panel:  Recent Labs Lab 04/19/15 1904 04/20/15 0730 04/21/15 0317  NA 141 139 142  K 3.6 3.5 4.0  CL 101 101 107  CO2 30 28 27   GLUCOSE 154* 127* 151*  BUN 11 9 15   CREATININE 1.19 1.11 1.47*  CALCIUM 10.3 9.3 8.1*   Liver Function Tests:  Recent Labs Lab 04/19/15 1904 04/20/15 0730 04/21/15 0317  AST 24 17 21   ALT 8* 6* 8*  ALKPHOS 60 56 37*  BILITOT 1.1 0.6 0.9  PROT 7.0 6.5 4.6*  ALBUMIN 3.9 3.7 2.6*    Recent Labs Lab 04/19/15 1904  LIPASE 17*   No results for input(s): AMMONIA in the last 168 hours. CBC:  Recent Labs Lab 04/19/15 1904 04/20/15 0730 04/21/15 0317  WBC 12.9* 11.6* 13.8*  HGB 13.7 13.1 11.4*  HCT 41.6 41.7 36.3*  MCV 76.8* 77.7* 79.3  PLT 177 155 158   Cardiac Enzymes: No results for input(s): CKTOTAL, CKMB, CKMBINDEX, TROPONINI in the last 168 hours. BNP (last 3 results) No results for input(s): BNP in the  last 8760 hours.  ProBNP (last 3 results) No results for input(s): PROBNP in the last 8760 hours.  CBG:  Recent Labs Lab 04/20/15 0746 04/20/15 1010 04/20/15 1431 04/21/15 0804 04/22/15 0751  GLUCAP 101* 107* 153* 123* 92    No results found for this or any previous visit (from the past 240 hour(s)).   Studies: No results found.  Scheduled Meds: . antiseptic oral rinse  7 mL Mouth Rinse q12n4p  . chlorhexidine gluconate  15 mL Mouth Rinse BID  . folic acid  1 mg Oral Daily  . heparin  5,000 Units Subcutaneous 3 times per day  . LORazepam  0-4 mg Intravenous Q12H  . multivitamin with minerals  1 tablet Oral Daily  . pantoprazole (PROTONIX) IV  40 mg Intravenous Q24H  . pneumococcal 23 valent vaccine  0.5 mL Intramuscular Tomorrow-1000  . sodium chloride  3 mL Intravenous Q12H  . thiamine  100 mg Oral Daily   Or  . thiamine  100 mg Intravenous Daily   Continuous Infusions: . sodium chloride 125 mL/hr at 04/22/15 0520  . lactated ringers 10 mL/hr at 04/20/15 1003    Principal Problem:   Small bowel obstruction Active Problems:   Alcohol abuse   Refusal of  blood transfusions as patient is Jehovah's Witness   Duodenal ulcer with hemorrhage   Hypertension   PUD (peptic ulcer disease)   Depression   Small bowel mass   Leukocytosis   Cancer of abdominal organ    Time spent: 40 minutes   WOODS, [place] [facility] Pager [phone]. If 7PM-7AM, please contact night-coverage at [email or web address], password [facility] 04/22/2015, 9:19 AM  LOS: 2 days    Care during the described time interval was provided by me .  I have reviewed this patient's available data, including medical history, events of note, physical examination, and all test results as part of my evaluation. I have personally reviewed and interpreted all radiology studies.   Dia Crawford, MD [phone] Pager

## 2015-04-22 NOTE — Progress Notes (Signed)
Attempted to give report. RN will call back when available.

## 2015-04-22 NOTE — Progress Notes (Signed)
  Echocardiogram 2D Echocardiogram has been performed.  Harold Price 04/22/2015, 12:04 PM

## 2015-04-22 NOTE — Progress Notes (Signed)
[facility] Progress Note  2 Days Post-Op  Subjective: Pt doing well.  No N/V.  Up in chair today.  NG with 469mL, some BS but no flatus yet.  Tolerating ice chips, pain better controlled.  Objective: Vital signs in last 24 hours: Temp:  [97.6 F (36.4 C)-98.2 F (36.8 C)] 98.2 F (36.8 C) (08/07 0509) Pulse Rate:  [86-98] 86 (08/07 0509) Resp:  [18-20] 19 (08/07 0509) BP: (119-139)/(50-63) 129/51 mmHg (08/07 0509) SpO2:  [94 %-100 %] 95 % (08/07 0509) Last BM Date: 04/19/15  Intake/Output from previous day: 08/06 0701 - 08/07 0700 In: 2525 [P.O.:300; I.V.:2075; IV Piggyback:150] Out: 1410 [Urine:1000; Emesis/NG output:400; Stool:10] Intake/Output this shift:    PE: Gen:  Alert, NAD, pleasant Abd: Soft, mild distension, mild tenderness, few BS, no HSM, incisions with sanguinous drainage on honeycomb dressing, ileostomy patient with no flatus or BM yet in bag  Lab Results:   Recent Labs  04/20/15 0730 04/21/15 0317  WBC 11.6* 13.8*  HGB 13.1 11.4*  HCT 41.7 36.3*  PLT 155 158   BMET  Recent Labs  04/20/15 0730 04/21/15 0317  NA 139 142  K 3.5 4.0  CL 101 107  CO2 28 27  GLUCOSE 127* 151*  BUN 9 15  CREATININE 1.11 1.47*  CALCIUM 9.3 8.1*   PT/INR  Recent Labs  04/20/15 0730  LABPROT 13.8  INR 1.04   CMP     Component Value Date/Time   NA 142 04/21/2015 0317   K 4.0 04/21/2015 0317   CL 107 04/21/2015 0317   CO2 27 04/21/2015 0317   GLUCOSE 151* 04/21/2015 0317   BUN 15 04/21/2015 0317   CREATININE 1.47* 04/21/2015 0317   CALCIUM 8.1* 04/21/2015 0317   PROT 4.6* 04/21/2015 0317   ALBUMIN 2.6* 04/21/2015 0317   AST 21 04/21/2015 0317   ALT 8* 04/21/2015 0317   ALKPHOS 37* 04/21/2015 0317   BILITOT 0.9 04/21/2015 0317   GFRNONAA 43* 04/21/2015 0317   GFRAA 49* 04/21/2015 0317   Lipase     Component Value Date/Time   LIPASE 17* 04/19/2015 1904       Studies/Results: No results  found.  Anti-infectives: Anti-infectives    Start     Dose/Rate Route Frequency Ordered Stop   04/21/15 1100  cefoTEtan (CEFOTAN) 2 g in dextrose 5 % 50 mL IVPB  Status:  Discontinued     2 g 100 mL/hr over 30 Minutes Intravenous  Once 04/20/15 1601 04/20/15 1658   04/20/15 1000  [MAR Hold]  cefoTEtan (CEFOTAN) 2 g in dextrose 5 % 50 mL IVPB     (MAR Hold since 04/20/15 0954)   2 g 100 mL/hr over 30 Minutes Intravenous To ShortStay Surgical 04/20/15 0859 04/20/15 1115       Assessment/Plan SBO with ileocecal mass POD #2 s/p subtotal colectomy, including terminal ileum, end ileostomy  -NPO, IVF, pain control, antiemetics -Ambulate and IS -SCD's and SQ heparin -Await bowel function prior to removing NG and starting diet -Await pathology -Labs pending -Ice and robaxin -NG with 441mL output, hopefully clamping trials tomorrow if flatus in bag -Remove honeycomb tomorrow  Leucocytosis - 13.8 yesterday Jehovah's Witness Etoh - CIWA Disp - Cont. Inpt.  Therapy said SNF when ready- Requests [facility]    LOS: 2 days    Nat Christen 04/22/2015, 7:47 AM Pager: [phone]

## 2015-04-22 NOTE — Progress Notes (Signed)
Report called to Jessica, RN on [facility].

## 2015-04-23 ENCOUNTER — Encounter (HOSPITAL_COMMUNITY): Payer: Self-pay | Admitting: General Surgery

## 2015-04-23 DIAGNOSIS — C180 Malignant neoplasm of cecum: Secondary | ICD-10-CM | POA: Diagnosis not present

## 2015-04-23 LAB — CBC WITH DIFFERENTIAL/PLATELET
Basophils Absolute: 0 10*3/uL (ref 0.0–0.1)
Basophils Relative: 0 % (ref 0–1)
Eosinophils Absolute: 0.1 10*3/uL (ref 0.0–0.7)
Eosinophils Relative: 3 % (ref 0–5)
HCT: 29.6 % — ABNORMAL LOW (ref 39.0–52.0)
Hemoglobin: 9.1 g/dL — ABNORMAL LOW (ref 13.0–17.0)
LYMPHS ABS: 0.7 10*3/uL (ref 0.7–4.0)
Lymphocytes Relative: 18 % (ref 12–46)
MCH: 24.1 pg — ABNORMAL LOW (ref 26.0–34.0)
MCHC: 30.7 g/dL (ref 30.0–36.0)
MCV: 78.3 fL (ref 78.0–100.0)
MONO ABS: 0.5 10*3/uL (ref 0.1–1.0)
MONOS PCT: 13 % — AB (ref 3–12)
Neutro Abs: 2.7 10*3/uL (ref 1.7–7.7)
Neutrophils Relative %: 67 % (ref 43–77)
PLATELETS: 176 10*3/uL (ref 150–400)
RBC: 3.78 MIL/uL — ABNORMAL LOW (ref 4.22–5.81)
RDW: 16.4 % — ABNORMAL HIGH (ref 11.5–15.5)
WBC: 4 10*3/uL (ref 4.0–10.5)

## 2015-04-23 LAB — PHOSPHORUS: Phosphorus: 1.7 mg/dL — ABNORMAL LOW (ref 2.5–4.6)

## 2015-04-23 LAB — COMPREHENSIVE METABOLIC PANEL
ALBUMIN: 2.2 g/dL — AB (ref 3.5–5.0)
ALT: 9 U/L — AB (ref 17–63)
AST: 31 U/L (ref 15–41)
Alkaline Phosphatase: 48 U/L (ref 38–126)
Anion gap: 6 (ref 5–15)
BUN: 14 mg/dL (ref 6–20)
CO2: 24 mmol/L (ref 22–32)
Calcium: 7.8 mg/dL — ABNORMAL LOW (ref 8.9–10.3)
Chloride: 109 mmol/L (ref 101–111)
Creatinine, Ser: 1.07 mg/dL (ref 0.61–1.24)
GFR calc Af Amer: >60 mL/min (ref >60)
GFR calc non Af Amer: >60 mL/min (ref >60)
Glucose, Bld: 95 mg/dL (ref 65–99)
Potassium: 3.6 mmol/L (ref 3.5–5.1)
SODIUM: 139 mmol/L (ref 135–145)
Total Bilirubin: 0.9 mg/dL (ref 0.3–1.2)
Total Protein: 4.7 g/dL — ABNORMAL LOW (ref 6.5–8.1)

## 2015-04-23 LAB — MAGNESIUM
MAGNESIUM: 2.5 mg/dL — AB (ref 1.7–2.4)
MAGNESIUM: 2.6 mg/dL — AB (ref 1.7–2.4)

## 2015-04-23 LAB — GLUCOSE, CAPILLARY
GLUCOSE-CAPILLARY: 118 mg/dL — AB (ref 65–99)
Glucose-Capillary: 124 mg/dL — ABNORMAL HIGH (ref 65–99)

## 2015-04-23 LAB — POTASSIUM: Potassium: 3.6 mmol/L (ref 3.5–5.1)

## 2015-04-23 MED ORDER — CHLORHEXIDINE GLUCONATE 0.12 % MT SOLN
OROMUCOSAL | Status: AC
  Administered 2015-04-23: 15 mL via OROMUCOSAL
  Filled 2015-04-23: qty 15

## 2015-04-23 MED ORDER — POTASSIUM PHOSPHATES 15 MMOLE/5ML IV SOLN
20.0000 meq | Freq: Once | INTRAVENOUS | Status: AC
  Administered 2015-04-23: 20 meq via INTRAVENOUS
  Filled 2015-04-23: qty 4.55

## 2015-04-23 MED ORDER — CHLORHEXIDINE GLUCONATE 0.12 % MT SOLN
15.0000 mL | Freq: Two times a day (BID) | OROMUCOSAL | Status: DC
  Administered 2015-04-24 – 2015-05-03 (×19): 15 mL via OROMUCOSAL
  Filled 2015-04-23 (×16): qty 15

## 2015-04-23 NOTE — Clinical Social Work Placement (Signed)
   CLINICAL SOCIAL WORK PLACEMENT  NOTE  Date:  04/23/2015  Patient Details  Name: Harold Price MRN: [id number] Date of Birth: May 21, 1930  Clinical Social Work is seeking post-discharge placement for this patient at the [place] level of care (*CSW will initial, date and re-position this form in  chart as items are completed):  Yes   Patient/family provided with [facility] Work Department's list of facilities offering this level of care within the geographic area requested by the patient (or if unable, by the patient's family).  Yes   Patient/family informed of their freedom to choose among providers that offer the needed level of care, that participate in Medicare, Medicaid or managed care program needed by the patient, have an available bed and are willing to accept the patient.  Yes   Patient/family informed of [name]'s ownership interest in [facility] and [facility], as well as of the fact that they are under no obligation to receive care at these facilities.  PASRR submitted to EDS on 04/23/15     PASRR number received on       Existing PASRR number confirmed on       FL2 transmitted to all facilities in geographic area requested by pt/family on 04/23/15     FL2 transmitted to all facilities within larger geographic area on       Patient informed that his/her managed care company has contracts with or will negotiate with certain facilities, including the following:            Patient/family informed of bed offers received.  Patient chooses bed at       Physician recommends and patient chooses bed at      Patient to be transferred to   on  .  Patient to be transferred to facility by       Patient family notified on   of transfer.  Name of family member notified:        PHYSICIAN Please sign FL2     Additional Comment:    _______________________________________________ Berton Mount, [place]

## 2015-04-23 NOTE — Progress Notes (Signed)
Physical Therapy Treatment Patient Details Name: Harold Price MRN: [id number] DOB: 1930-02-20 Today's Date: 04/23/2015    History of Present Illness 81 y.o. male s/p subtotal colectomy, including terminal ileum, end ileostomy     PT Comments    Patient seated in chair upon PT arrival, agreeable to participate today.See flowsheet for balance and transfer details. Pain noted to be a 5/10 at rest, 8/10 with ambulation, upon which patient requested to return to the room. Patient will benefit from PT to address gait deviations and endurance with pain control.   Follow Up Recommendations  SNF     Equipment Recommendations  Other (comment) (TBA)    Recommendations for Other Services       Precautions / Restrictions Precautions Precautions: Fall Restrictions Weight Bearing Restrictions: No    Mobility  Bed Mobility               General bed mobility comments: Patient seated in chair on PT arrival.  Transfers Overall transfer level: Needs assistance Equipment used: Rolling walker (2 wheeled) Transfers: Sit to/from Stand Sit to Stand: Min guard         General transfer comment: Patient was able to stand from recliner with no assistance but required immediate assistance upon standing to maintain standing balance.  Ambulation/Gait Ambulation/Gait assistance: Min guard Ambulation Distance (Feet): 40 Feet Assistive device: Rolling walker (2 wheeled) Gait Pattern/deviations: Step-to pattern;Decreased stride length;Shuffle;Trunk flexed   Gait velocity interpretation: <1.8 ft/sec, indicative of risk for recurrent falls General Gait Details: Patient shuffling gait due to increased pain, ambulation was shortened due to pain complaints.   Stairs            Wheelchair Mobility    Modified Rankin (Stroke Patients Only)       Balance Overall balance assessment: Needs assistance Sitting-balance support: Feet supported;Bilateral upper extremity supported (Seated in  chair.) Sitting balance-Leahy Scale: Good     Standing balance support: Bilateral upper extremity supported Standing balance-Leahy Scale: Poor                      Cognition Arousal/Alertness: Awake/alert Behavior During Therapy: WFL for tasks assessed/performed Overall Cognitive Status: Within Functional Limits for tasks assessed                      Exercises      General Comments        Pertinent Vitals/Pain Pain Assessment: 0-10 Pain Score: 8  (with ambulation, started 5/10 at rest) Pain Location: abdomen Pain Descriptors / Indicators: Cramping;Sore Pain Intervention(s): Limited activity within patient's tolerance;Monitored during session    Home Living                      Prior Function            PT Goals (current goals can now be found in the care plan section) Acute Rehab PT Goals Patient Stated Goal: Get better and go home PT Goal Formulation: With patient Time For Goal Achievement: 05/05/15 Potential to Achieve Goals: Good Progress towards PT goals: Progressing toward goals    Frequency  Min 3X/week    PT Plan Current plan remains appropriate    Co-evaluation             End of Session Equipment Utilized During Treatment: Gait belt Activity Tolerance: Patient limited by pain Patient left: in chair;with call bell/phone within reach;with nursing/sitter in room;with chair alarm set     Time: [phone]  PT Time Calculation (min) (ACUTE ONLY): 17 min  Charges:  $Gait Training: 8-22 mins                    G CodesRoanna Epley, SPT [phone]  04/23/2015, 5:27 PM

## 2015-04-23 NOTE — Clinical Social Work Note (Signed)
Clinical Social Work Assessment  Patient Details  Name: Harold Price MRN: [id number] Date of Birth: 12/02/1929  Date of referral:  04/23/15               Reason for consult:  Facility Placement                Permission sought to share information with:  Chartered certified accountant granted to share information::  Yes, Verbal Permission Granted  Name::        Agency::  [facility]  Relationship::     Contact Information:     Housing/Transportation Living arrangements for the past 2 months:  [place] of Information:  Patient Patient Interpreter Needed:  None Criminal Activity/Legal Involvement Pertinent to Current Situation/Hospitalization:  No - Comment as needed Significant Relationships:  Spouse Lives with:  Spouse Do you feel safe going back to the place where you live?  Yes Need for family participation in patient care:  No (Coment)  Care giving concerns:  Pt requiring assistance with mobility   Social Worker assessment / plan:  CSW visited pt room to discuss SNF. Pt aware of recommendation and in agreement. Pt requesting to dc to [facility]. Pt wife has been to facility and pt/pt family very happy with care. Pt agreeable to considering other facilities if [facility] cannot accept, but at this time, pt requests clinicals only be sent to preferred facility.  Employment status:  Retired Forensic scientist:  Medicare PT Recommendations:  [place] / Referral to community resources:  [place]  Patient/Family's Response to care:  Pt agreeable to [employer].  Patient/Family's Understanding of and Emotional Response to Diagnosis, Current Treatment, and Prognosis:  Pt has limited understanding of his condition. Pt with an appropriate emotional response and in good spirits during CSW visit.  Emotional Assessment Appearance:  Well-Groomed Attitude/Demeanor/Rapport:  Other  (Cooperative) Affect (typically observed):  Calm, Pleasant, Appropriate Orientation:  Oriented to Self, Oriented to Place, Oriented to  Time, Oriented to Situation Alcohol / Substance use:  Alcohol Use (Alcohol use documented by MD; Pt not wanting to complete SBIRT with CSW at time of assessment) Psych involvement (Current and /or in the community):  No (Comment)  Discharge Needs  Concerns to be addressed:  Discharge Planning Concerns Readmission within the last 30 days:  No Current discharge risk:  Dependent with Mobility Barriers to Discharge:  Awaiting State Approval ([place]) (Request for additional information-30day note. Notified MD and awaiting signature to submit requested documents)   [place], [place]

## 2015-04-23 NOTE — Progress Notes (Signed)
The client changed cardiac rhythm from atrial fibrillation to normal sinus rhythm. A strip was saved in epic and the night fellow was notified. Heart Rate is running in the 80's with a Cardizem drip going at 10.

## 2015-04-23 NOTE — Progress Notes (Signed)
Patient ID: Harold Price, male   DOB: [birth year], 81 y.o.   MRN: [id number] 3 Days Post-Op  Subjective: Pt went into A fib with RVR yesterday and transferred to 3W.  Converted back to NSR.  NGT came out yesterday.  No nausea.    Objective: Vital signs in last 24 hours: Temp:  [97.4 F (36.3 C)-98.5 F (36.9 C)] 98.4 F (36.9 C) (08/08 0500) Pulse Rate:  [65-145] 73 (08/08 0500) Resp:  [18-24] 20 (08/08 0500) BP: (104-154)/(42-81) 140/81 mmHg (08/08 0500) SpO2:  [96 %-98 %] 98 % (08/08 0500) Last BM Date: 04/19/15  Intake/Output from previous day: 08/07 0701 - 08/08 0700 In: 2143.7 [I.V.:2143.7] Out: 500 [Urine:500] Intake/Output this shift:    PE: Abd: soft, appropriately tender, incision c/d/i with staples, and some old drainage on honeycomb dressing, few BS, ileostomy with tan output Heart: regular with occasional ectopic beats  Lab Results:   Recent Labs  04/21/15 0317 04/23/15 0404  WBC 13.8* 4.0  HGB 11.4* 9.1*  HCT 36.3* 29.6*  PLT 158 176   BMET  Recent Labs  04/22/15 1202 04/23/15 0404  NA 141 139  K 4.2 3.6  CL 110 109  CO2 24 24  GLUCOSE 85 95  BUN 15 14  CREATININE 1.18 1.07  CALCIUM 8.2* 7.8*   PT/INR No results for input(s): LABPROT, INR in the last 72 hours. CMP     Component Value Date/Time   NA 139 04/23/2015 0404   K 3.6 04/23/2015 0404   CL 109 04/23/2015 0404   CO2 24 04/23/2015 0404   GLUCOSE 95 04/23/2015 0404   BUN 14 04/23/2015 0404   CREATININE 1.07 04/23/2015 0404   CALCIUM 7.8* 04/23/2015 0404   PROT 4.7* 04/23/2015 0404   ALBUMIN 2.2* 04/23/2015 0404   AST 31 04/23/2015 0404   ALT 9* 04/23/2015 0404   ALKPHOS 48 04/23/2015 0404   BILITOT 0.9 04/23/2015 0404   GFRNONAA >60 04/23/2015 0404   GFRAA >60 04/23/2015 0404   Lipase     Component Value Date/Time   LIPASE 17* 04/19/2015 1904       Studies/Results: No results found.  Anti-infectives: Anti-infectives    Start     Dose/Rate Route Frequency Ordered  Stop   04/21/15 1100  cefoTEtan (CEFOTAN) 2 g in dextrose 5 % 50 mL IVPB  Status:  Discontinued     2 g 100 mL/hr over 30 Minutes Intravenous  Once 04/20/15 1601 04/20/15 1658   04/20/15 1000  [MAR Hold]  cefoTEtan (CEFOTAN) 2 g in dextrose 5 % 50 mL IVPB     (MAR Hold since 04/20/15 0954)   2 g 100 mL/hr over 30 Minutes Intravenous To ShortStay Surgical 04/20/15 0859 04/20/15 1115       Assessment/Plan  POD 3, s/p  Subtotal colectomy with terminal ileum and ileostomy -this is almost certainly a cancer.  Await pathology. -ileus seems to be resolving.  Will start on clear liquids today -pulm toilet and mobilize -WOC RN seeing patient for ostomy teaching.  LOS: 3 days    Sharissa Brierley E 04/23/2015, 9:00 AM Pager: [phone]

## 2015-04-23 NOTE — Evaluation (Addendum)
Occupational Therapy Evaluation Patient Details Name: Harold Price MRN: [id number] DOB: 02-13-1930 Today's Date: 04/23/2015    History of Present Illness 81 y.o. male s/p subtotal colectomy, including terminal ileum, end ileostomy    Clinical Impression   Pt s/p above. Pt independent with ADLs, PTA. Feel pt will benefit from acute OT to increase independence and strength prior to d/c. Pt planning to go to SNF for rehab.    Follow Up Recommendations  SNF    Equipment Recommendations  Other (comment) (defer to next venue)    Recommendations for Other Services       Precautions / Restrictions Precautions Precautions: Fall Restrictions Weight Bearing Restrictions: No      Mobility Bed Mobility               General bed mobility comments: not assessed  Transfers Overall transfer level: Needs assistance   Transfers: Sit to/from Stand Sit to Stand: Mod assist        Comments: Assist to boost to stand. Cues for hand placement.      Balance          Min guard for ambulation with RW.                                   ADL Overall ADL's : Needs assistance/impaired Eating/Feeding: Independent;Sitting                   Lower Body Dressing: Moderate assistance;Sit to/from stand   Toilet Transfer: Moderate assistance;Min guard;Ambulation;RW (Mod assist for sit to stand transfer)           Functional mobility during ADLs: Min guard;Rolling walker General ADL Comments: Explained that AE is available to assist with LB dressing. Educated on LB dressing technique.      Vision  Pt wears glasses.   Perception     Praxis      Pertinent Vitals/Pain Pain Assessment: 0-10 Pain Score: 5  Pain Location: abdomen Pain Descriptors / Indicators: Sore;Pressure Pain Intervention(s): Monitored during session;Limited activity within patient's tolerance;Other (comment) (notified nurse)     Hand Dominance Right   Extremity/Trunk Assessment  Upper Extremity Assessment Upper Extremity Assessment: Overall WFL for tasks assessed   Lower Extremity Assessment Lower Extremity Assessment: Defer to PT evaluation       Communication Communication Communication: seemed a little HOH   Cognition Arousal/Alertness: Awake/alert Behavior During Therapy: WFL for tasks assessed/performed Overall Cognitive Status: Within Functional Limits for tasks assessed                     General Comments       Exercises       Shoulder Instructions      Home Living Family/patient expects to be discharged to:: Private residence Living Arrangements: Spouse/significant other;Children Available Help at Discharge: Family Type of Home: House Home Access: Ramped entrance     Home Layout: One level               Home Equipment: Environmental consultant - 2 wheels;Shower seat          Prior Functioning/Environment Level of Independence: Independent             OT Diagnosis: Acute pain;Generalized weakness   OT Problem List: Decreased strength;Decreased activity tolerance;Decreased range of motion;Pain;Decreased knowledge of precautions;Decreased knowledge of use of DME or AE   OT Treatment/Interventions: Self-care/ADL training;DME and/or AE instruction;Therapeutic activities;Energy conservation;Therapeutic exercise;Patient/family  education;Balance training    OT Goals(Current goals can be found in the care plan section) Acute Rehab OT Goals Patient Stated Goal: not stated OT Goal Formulation: With patient Time For Goal Achievement: 04/30/15 Potential to Achieve Goals: Good ADL Goals Pt Will Perform Lower Body Bathing: with adaptive equipment;sit to/from stand;with set-up;with supervision Pt Will Perform Lower Body Dressing: with set-up;with supervision;with adaptive equipment;sit to/from stand Pt Will Transfer to Toilet: with supervision;ambulating Pt Will Perform Toileting - Clothing Manipulation and hygiene: sit to/from stand;with  set-up  OT Frequency: Min 2X/week   Barriers to D/C:            Co-evaluation              End of Session Equipment Utilized During Treatment: Gait belt;Rolling walker Nurse Communication: Other (comment) (pain)  Activity Tolerance: Patient limited by pain Patient left: in chair;with call bell/phone within reach   Time: 0925-0938 OT Time Calculation (min): 13 min Charges:  OT General Charges $OT Visit: 1 Procedure OT Evaluation $Initial OT Evaluation Tier I: 1 Procedure G-CodesBenito Mccreedy OTR/L [id number] 04/23/2015, 10:28 AM

## 2015-04-23 NOTE — Care Management Important Message (Signed)
Important Message  Patient Details  Name: Harold Price MRN: [id number] Date of Birth: [birth year]   Medicare Important Message Given:  Yes-second notification given    Pricilla Handler 04/23/2015, 3:09 PM

## 2015-04-23 NOTE — Consult Note (Signed)
WOC ostomy consult note Stoma type/location: RLQ, end ileostomy Stomal assessment/size: just a bit larger than 1 3/4" round, nicely budded, pink and moist Peristomal assessment: pouch intact Treatment options for stomal/peristomal skin: NA  Output liquid, tan Ostomy pouching: 1pc.in place from [place] provided: demonstrated emptying the pouch and the need for emptying when 1/3-1/2 way full which it was when I arrived.  Patient demonstrated lock and roll closure after one demonstration from the [facility] nurse. Will plan pouch change tom am.  Asked about family support and he would like to learn to care for his ostomy independently.  Appears may be going to SNF at [place].  Enrolled patient in [place] program: Yes  WOC will follow along for continued ostomy care and teaching.  [place], [place]

## 2015-04-23 NOTE — Progress Notes (Signed)
[facility] PROGRESS NOTE  Harold Price INO:[id number] DOB: Jan 24, 1930 DOA: 04/19/2015 PCP: No PCP Per Patient HPI/Subjective: [name] PMHx Depression,HTN, ETOH Abuse, PUD, Jehovah's witness,   Presents with nausea, vomiting, abdominal distention and abdominal pain.  Patient reports that his symptoms started 2 days ago. He vomited 6-7 times a day today. His abdominal pain is located in the periumbilical area, constant, 5 out of 10 in severity, nonradiating. It is not aggravated or alleviated by any known factors. He does not have symptoms of UTI, no fever, chills, chest pain, shortness breath, diarrhea. No unilateral weakness, leg edema or rashes.  In ED, patient was found to have WBC 12.9, temperature normal, no tachycardia, electrolytes okay, is 19, negative urinalysis. CT abdomen/pelvis showed high-grade small bowel obstruction caused by a mass of the distal ileum or ileocecal valve. Multiple low-attenuation foci in the liver, more likely benign cysts but the smaller ones cannot be conclusively characterized. Patient is admitted to inpatient for further evaluation and treatment. General surgery was consulted by ED. 8/6 A/O 4, patient confirms that he is a Jehovah's Witness and will not take any blood products. Negative SOB, negative CP, some abdominal discomfort secondary to surgery. 8/7 afebrile overnight, negative CP, negative SOB, negative N/V. States only concern is when coughing positive abdominal pain. Sitting in chair comfortably 8/8 A/O 4, CP, negative SOB. States consumed clear liquids this a.m. for his breakfast without postprandial pain or N/V.     Assessment/Plan: Small bowel obstruction secondary to small bowel mass: -Pathology pending -Tolerating clear liquids -morphine prn pain  -Continue Normal saline 125 ml/hr, until patient able to consume enough fluids to keep up with needs. -Strict in and out since admission + 4.9 L  Alcohol abuse: -Did counseling about  the importance of quitting drinking -Continue CIWA protocol -Echocardiogram; pending  Hx of PUD and duodenal ulcer with hemorrhage: -Continue IV protonix 40 mg daily  Hypetension/hypotension: -Improved with hydration. Would continue hydration until patient able to begin consuming PO  A-Fib with RVR - Patient now NSR DC Cardizem drip -Continue to monitor closely. Most likely cause of patient's A. fib is surgical induce stress. -Continue PRN metoprolol  LVH -See A. fib with RVR  Hypokalemia -Potassium phosphate 20 mEq 1 -Repeat potassium/magnesium at 1400  Hypophosphatemia  -See hypokalemia  Depression:  -Not taking med at home. Stable, no suicidal or homicidal ideations. -Observe closely      Code Status: Full Family Communication: None  Disposition Plan: per surgery   Consultants:  Dr.Matthew Donne Hazel ([facility])    Procedures: 8/5 :Small bowel obstruction, ileocecal mass 8/7 echocardiogram;- Left ventricle: moderate concentric hypertrophy. -LVEF= 55% to 60%.  -(grade 1diastolic dysfunction) - Atrial septum: A patent foramen ovale cannot be excluded.   Cultures   Antibiotics: NA   DVT prophylaxis SCD    Objective: Filed Vitals:   04/23/15 0357 04/23/15 0412 04/23/15 0427 04/23/15 0500  BP: 140/52 131/52 127/59 140/81  Pulse:    73  Temp:    98.4 F (36.9 C)  TempSrc:    Oral  Resp: 19 20 20 20   Height:      Weight:      SpO2:    98%    Intake/Output Summary (Last 24 hours) at 04/23/15 1203 Last data filed at 04/22/15 2144  Gross per 24 hour  Intake 2143.67 ml  Output    500 ml  Net 1643.67 ml   Filed Weights   04/20/15 0445  Weight: 99.2 kg (218 lb 11.1 oz)  Exam: General: A/O 4, sitting comfortably in chair. appropriate abdominal pain for recent surgery, No acute respiratory distress Eyes: Negative headache, eye pain, double vision,negative scleral hemorrhage ENT: Negative Runny nose, negative ear pain, negative tinnitus,  negative gingival bleeding, Neck:  Negative scars, masses, torticollis, lymphadenopathy, JVD Lungs: Clear to auscultation bilaterally without wheezes or crackles Cardiovascular: Regular rate and rhythm without murmur gallop or rub normal S1 and S2 Abdomen: Positive mild abdominal pain, negative dysphagia, nondistended, positive soft, bowel sounds, no rebound, no ascites, no appreciable mass, surgical dressing in place with old sanguineous fluid coating about 1/2 way up. Colostomy bag in place, stomal pain viable. Extremities: No significant cyanosis, clubbing, or edema bilateral lower extremities Psychiatric:  Negative depression, negative anxiety, negative fatigue, negative mania  Neurologic:  Cranial nerves II through XII intact, tongue/uvula midline, all extremities muscle strength 5/5, sensation intact throughout, negative dysarthria, negative expressive aphasia, negative receptive aphasia.     Data Reviewed: Basic Metabolic Panel:  Recent Labs Lab 04/19/15 1904 04/20/15 0730 04/21/15 0317 04/22/15 1202 04/23/15 0404  NA 141 139 142 141 139  K 3.6 3.5 4.0 4.2 3.6  CL 101 101 107 110 109  CO2 30 28 27 24 24   GLUCOSE 154* 127* 151* 85 95  BUN 11 9 15 15 14   CREATININE 1.19 1.11 1.47* 1.18 1.07  CALCIUM 10.3 9.3 8.1* 8.2* 7.8*  MG  --   --   --   --  2.5*  PHOS  --   --   --   --  1.7*   Liver Function Tests:  Recent Labs Lab 04/19/15 1904 04/20/15 0730 04/21/15 0317 04/23/15 0404  AST 24 17 21 31   ALT 8* 6* 8* 9*  ALKPHOS 60 56 37* 48  BILITOT 1.1 0.6 0.9 0.9  PROT 7.0 6.5 4.6* 4.7*  ALBUMIN 3.9 3.7 2.6* 2.2*    Recent Labs Lab 04/19/15 1904  LIPASE 17*   No results for input(s): AMMONIA in the last 168 hours. CBC:  Recent Labs Lab 04/19/15 1904 04/20/15 0730 04/21/15 0317 04/23/15 0404  WBC 12.9* 11.6* 13.8* 4.0  NEUTROABS  --   --   --  2.7  HGB 13.7 13.1 11.4* 9.1*  HCT 41.6 41.7 36.3* 29.6*  MCV 76.8* 77.7* 79.3 78.3  PLT 177 155 158 176    Cardiac Enzymes: No results for input(s): CKTOTAL, CKMB, CKMBINDEX, TROPONINI in the last 168 hours. BNP (last 3 results) No results for input(s): BNP in the last 8760 hours.  ProBNP (last 3 results) No results for input(s): PROBNP in the last 8760 hours.  CBG:  Recent Labs Lab 04/20/15 0746 04/20/15 1010 04/20/15 1431 04/21/15 0804 04/22/15 0751  GLUCAP 101* 107* 153* 123* 92    No results found for this or any previous visit (from the past 240 hour(s)).   Studies: No results found.  Scheduled Meds: . antiseptic oral rinse  7 mL Mouth Rinse q12n4p  . chlorhexidine gluconate  15 mL Mouth Rinse BID  . enoxaparin (LOVENOX) injection  40 mg Subcutaneous Q24H  . folic acid  1 mg Oral Daily  . LORazepam  0-4 mg Intravenous Q12H  . multivitamin with minerals  1 tablet Oral Daily  . pantoprazole (PROTONIX) IV  40 mg Intravenous Q24H  . potassium phosphate IVPB (mEq)  20 mEq Intravenous Once  . sodium chloride  3 mL Intravenous Q12H  . thiamine  100 mg Oral Daily   Or  . thiamine  100 mg Intravenous Daily  Continuous Infusions: . sodium chloride 125 mL/hr at 04/23/15 0602  . lactated ringers 10 mL/hr at 04/20/15 1003    Principal Problem:   Small bowel obstruction Active Problems:   Alcohol abuse   Refusal of blood transfusions as patient is Jehovah's Witness   Duodenal ulcer with hemorrhage   Hypertension   PUD (peptic ulcer disease)   Depression   Small bowel mass   Leukocytosis   Cancer of abdominal organ   Hypokalemia   LVH (left ventricular hypertrophy)   Hypophosphatemia    Time spent: 40 minutes   [place], [place] [facility] Pager [phone]. If 7PM-7AM, please contact night-coverage at [email or web address], password [facility] 04/23/2015, 12:03 PM  LOS: 3 days    Care during the described time interval was provided by me .  I have reviewed this patient's available data, including medical history, events of note, physical examination, and all test  results as part of my evaluation. I have personally reviewed and interpreted all radiology studies.   Dia Crawford, MD [phone] Pager

## 2015-04-24 ENCOUNTER — Inpatient Hospital Stay (HOSPITAL_COMMUNITY): Payer: Medicare Other

## 2015-04-24 DIAGNOSIS — F1010 Alcohol abuse, uncomplicated: Secondary | ICD-10-CM

## 2015-04-24 DIAGNOSIS — E876 Hypokalemia: Secondary | ICD-10-CM

## 2015-04-24 DIAGNOSIS — K5669 Other intestinal obstruction: Secondary | ICD-10-CM

## 2015-04-24 LAB — GLUCOSE, CAPILLARY
GLUCOSE-CAPILLARY: 119 mg/dL — AB (ref 65–99)
GLUCOSE-CAPILLARY: 84 mg/dL (ref 65–99)
GLUCOSE-CAPILLARY: 95 mg/dL (ref 65–99)
Glucose-Capillary: 105 mg/dL — ABNORMAL HIGH (ref 65–99)
Glucose-Capillary: 95 mg/dL (ref 65–99)

## 2015-04-24 MED ORDER — DILTIAZEM HCL 100 MG IV SOLR
5.0000 mg/h | INTRAVENOUS | Status: DC
  Administered 2015-04-24: 10 mg/h via INTRAVENOUS
  Administered 2015-04-24: 5 mg/h via INTRAVENOUS
  Administered 2015-04-25 (×2): 10 mg/h via INTRAVENOUS
  Filled 2015-04-24 (×8): qty 100

## 2015-04-24 MED ORDER — DILTIAZEM LOAD VIA INFUSION
10.0000 mg | Freq: Once | INTRAVENOUS | Status: AC
  Administered 2015-04-24: 10 mg via INTRAVENOUS
  Filled 2015-04-24: qty 10

## 2015-04-24 MED ORDER — MORPHINE SULFATE 2 MG/ML IJ SOLN
1.0000 mg | INTRAMUSCULAR | Status: DC | PRN
  Administered 2015-04-26: 1 mg via INTRAVENOUS
  Filled 2015-04-24: qty 1

## 2015-04-24 MED ORDER — ONDANSETRON HCL 4 MG/2ML IJ SOLN
4.0000 mg | Freq: Four times a day (QID) | INTRAMUSCULAR | Status: DC
  Administered 2015-04-24 – 2015-05-03 (×29): 4 mg via INTRAVENOUS
  Filled 2015-04-24 (×30): qty 2

## 2015-04-24 NOTE — Care Management Note (Signed)
Case Management Note  Patient Details  Name: [name] MRN: [id number] Date of Birth: 1929/11/14  Subjective/Objective:  Pt admitted for Nausea, vomiting and abdominal distention. S/p subtotal colectomy with terminal ileum and ileostomy.                   Action/Plan: CSW following for disposition needs for SNF. CM will continue to monitor.   Expected Discharge Date:                  Expected Discharge Plan:  Skilled Nursing Facility  In-House Referral:  Clinical Social Work  Discharge planning Services  CM Consult  Post Acute Care Choice:    Choice offered to:     DME Arranged:    DME Agency:     HH Arranged:    [place] Agency:     Status of Service:  In process, will continue to follow  Medicare Important Message Given:  Yes-second notification given Date Medicare IM Given:    Medicare IM give by:    Date Additional Medicare IM Given:    Additional Medicare Important Message give by:     If discussed at [place] of Stay Meetings, dates discussed:    Additional Comments:  Bethena Roys, RN 04/24/2015, 4:51 PM

## 2015-04-24 NOTE — Progress Notes (Addendum)
[facility] PROGRESS NOTE  Harold Price CVE:[id number] DOB: [birth year] DOA: 04/19/2015 PCP: No PCP Per Patient HPI/Subjective: [name] PMHx Depression,HTN, ETOH Abuse, PUD, Jehovah's witness,   Presents with nausea, vomiting, abdominal distention and abdominal pain.  Patient reports that his symptoms started 2 days ago. He vomited 6-7 times a day today. His abdominal pain is located in the periumbilical area, constant, 5 out of 10 in severity, nonradiating. It is not aggravated or alleviated by any known factors. He does not have symptoms of UTI, no fever, chills, chest pain, shortness breath, diarrhea. No unilateral weakness, leg edema or rashes.  In ED, patient was found to have WBC 12.9, temperature normal, no tachycardia, electrolytes okay, is 19, negative urinalysis. CT abdomen/pelvis showed high-grade small bowel obstruction caused by a mass of the distal ileum or ileocecal valve. Multiple low-attenuation foci in the liver, more likely benign cysts but the smaller ones cannot be conclusively characterized. Patient is admitted to inpatient for further evaluation and treatment. General surgery was consulted by ED.   Having some vomiting this AM   Assessment/Plan: Small bowel obstruction secondary to small bowel mass: -Pathology: ULCERATED INVASIVE ADENOCARCINOMA, SEE COMMENT. - POSITIVE FOR LYMPH VASCULAR INVASION -vomiting today -morphine prn pain  -Continue Normal saline 125 ml/hr, until patient able to consume enough fluids to keep up with needs. -Strict in and out since admission + 4.9 L -oncology consult  Alcohol abuse: -Did counseling about the importance of quitting drinking D/c CIWA protocol -Echocardiogram done EF preserved  Hx of PUD and duodenal ulcer with hemorrhage: -Continue IV protonix 40 mg daily  Hypetension/hypotension: -Improved with hydration. Would continue hydration until patient able to begin consuming PO  A-Fib with RVR - Patient now NSR DC  Cardizem drip -Continue to monitor closely. Most likely cause of patient's A. fib is surgical induce stress. -start POs when able to take  LVH -See A. fib with RVR  Hypokalemia replete   Hypophosphatemia  replete  Depression:  -Not taking med at home. Stable, no suicidal or homicidal ideations. -Observe closely      Code Status: Full Family Communication: None  Disposition Plan: SNF when medically ready   Consultants:  Dr.Matthew Donne Hazel ([facility])    Procedures: 8/5 :Small bowel obstruction, ileocecal mass 8/7 echocardiogram;- Left ventricle: moderate concentric hypertrophy. -LVEF= 55% to 60%.  -(grade 1diastolic dysfunction) - Atrial septum: A patent foramen ovale cannot be excluded.   Cultures   Antibiotics: NA   DVT prophylaxis SCD    Objective: Filed Vitals:   04/23/15 0500 04/23/15 2004 04/24/15 0500 04/24/15 1048  BP: 140/81 133/53 134/68 112/77  Pulse: 73 87 2   Temp: 98.4 F (36.9 C) 97.5 F (36.4 C) 98.3 F (36.8 C)   TempSrc: Oral Oral Oral   Resp: 20 20 20 21   Height:      Weight:      SpO2: 98% 98% 98%     Intake/Output Summary (Last 24 hours) at 04/24/15 1350 Last data filed at 04/23/15 2230  Gross per 24 hour  Intake      3 ml  Output      0 ml  Net      3 ml   Filed Weights   04/20/15 0445  Weight: 99.2 kg (218 lb 11.1 oz)     Exam: General: A/O 4, uncomfortable appearing in chair Lungs: Clear to auscultation bilaterally without wheezes or crackles Cardiovascular: Regular rate and rhythm without murmur gallop or rub normal S1 and S2 Abdomen: Positive  mild abdominal pain, negative dysphagia, nondistended, positive soft, bowel sounds, no rebound, no ascites, no appreciable mass, surgical dressing in place with old sanguineous fluid coating about 1/2 way up. Colostomy bag in place, stomal pain viable. Extremities: No significant cyanosis, clubbing, or edema bilateral lower extremities    Data Reviewed: Basic  Metabolic Panel:  Recent Labs Lab 04/19/15 1904 04/20/15 0730 04/21/15 0317 04/22/15 1202 04/23/15 0404 04/23/15 1336  NA 141 139 142 141 139  --   K 3.6 3.5 4.0 4.2 3.6 3.6  CL 101 101 107 110 109  --   CO2 30 28 27 24 24   --   GLUCOSE 154* 127* 151* 85 95  --   BUN 11 9 15 15 14   --   CREATININE 1.19 1.11 1.47* 1.18 1.07  --   CALCIUM 10.3 9.3 8.1* 8.2* 7.8*  --   MG  --   --   --   --  2.5* 2.6*  PHOS  --   --   --   --  1.7*  --    Liver Function Tests:  Recent Labs Lab 04/19/15 1904 04/20/15 0730 04/21/15 0317 04/23/15 0404  AST 24 17 21 31   ALT 8* 6* 8* 9*  ALKPHOS 60 56 37* 48  BILITOT 1.1 0.6 0.9 0.9  PROT 7.0 6.5 4.6* 4.7*  ALBUMIN 3.9 3.7 2.6* 2.2*    Recent Labs Lab 04/19/15 1904  LIPASE 17*   No results for input(s): AMMONIA in the last 168 hours. CBC:  Recent Labs Lab 04/19/15 1904 04/20/15 0730 04/21/15 0317 04/23/15 0404  WBC 12.9* 11.6* 13.8* 4.0  NEUTROABS  --   --   --  2.7  HGB 13.7 13.1 11.4* 9.1*  HCT 41.6 41.7 36.3* 29.6*  MCV 76.8* 77.7* 79.3 78.3  PLT 177 155 158 176   Cardiac Enzymes: No results for input(s): CKTOTAL, CKMB, CKMBINDEX, TROPONINI in the last 168 hours. BNP (last 3 results) No results for input(s): BNP in the last 8760 hours.  ProBNP (last 3 results) No results for input(s): PROBNP in the last 8760 hours.  CBG:  Recent Labs Lab 04/23/15 2242 04/24/15 0052 04/24/15 0338 04/24/15 0744 04/24/15 1148  GLUCAP 118* 119* 105* 95 95    No results found for this or any previous visit (from the past 240 hour(s)).   Studies: No results found.  Scheduled Meds: . antiseptic oral rinse  7 mL Mouth Rinse q12n4p  . chlorhexidine  15 mL Mouth/Throat BID  . enoxaparin (LOVENOX) injection  40 mg Subcutaneous Q24H  . folic acid  1 mg Oral Daily  . multivitamin with minerals  1 tablet Oral Daily  . pantoprazole (PROTONIX) IV  40 mg Intravenous Q24H  . sodium chloride  3 mL Intravenous Q12H  . thiamine  100 mg  Oral Daily   Continuous Infusions: . sodium chloride 125 mL/hr at 04/24/15 1045  . diltiazem (CARDIZEM) infusion 10 mg/hr (04/24/15 1048)  . lactated ringers 10 mL/hr at 04/20/15 1003    Principal Problem:   Small bowel obstruction Active Problems:   Alcohol abuse   Refusal of blood transfusions as patient is Jehovah's Witness   Duodenal ulcer with hemorrhage   Hypertension   PUD (peptic ulcer disease)   Depression   Small bowel mass   Leukocytosis   Cancer of abdominal organ   Hypokalemia   LVH (left ventricular hypertrophy)   Hypophosphatemia    Time spent: 25 minutes   Rynn Markiewicz  [facility]  Pager [phone] If 7PM-7AM, please contact night-coverage at [email or web address], password [facility] 04/24/2015, 1:50 PM  LOS: 4 days

## 2015-04-24 NOTE — Clinical Social Work Placement (Signed)
   CLINICAL SOCIAL WORK PLACEMENT  NOTE  Date:  04/24/2015  Patient Details  Name: Harold Price MRN: [id number] Date of Birth: Jul 15, 1930  Clinical Social Work is seeking post-discharge placement for this patient at the [place] level of care (*CSW will initial, date and re-position this form in  chart as items are completed):  Yes   Patient/family provided with [facility] Work Department's list of facilities offering this level of care within the geographic area requested by the patient (or if unable, by the patient's family).  Yes   Patient/family informed of their freedom to choose among providers that offer the needed level of care, that participate in Medicare, Medicaid or managed care program needed by the patient, have an available bed and are willing to accept the patient.  Yes   Patient/family informed of [facility]'s ownership interest in [facility] and [facility], as well as of the fact that they are under no obligation to receive care at these facilities.  PASRR submitted to EDS on 04/23/15     PASRR number received on       Existing PASRR number confirmed on       FL2 transmitted to all facilities in geographic area requested by pt/family on 04/23/15     FL2 transmitted to all facilities within larger geographic area on       Patient informed that his/her managed care company has contracts with or will negotiate with certain facilities, including the following:        Yes   Patient/family informed of bed offers received.  Patient [facility] bed at [facility]     Physician recommends and patient chooses bed at      Patient to be transferred to   on  .  Patient to be transferred to facility by       Patient family notified on   of transfer.  Name of family member notified:        PHYSICIAN Please sign FL2, Please prepare priority discharge summary, including medications, Please prepare prescriptions      Additional Comment:    _______________________________________________ Berton Mount, [place]

## 2015-04-24 NOTE — Progress Notes (Addendum)
The client had nausea and vomiting. I administered 4 mg of ondansetron and the symptoms were resolved. Notified  Schorr NP about this being the first day the client had food since his Ileostomy and that they have had output from the Ileostomy today. Will continue to monitor.

## 2015-04-24 NOTE — Progress Notes (Signed)
[facility] Progress Note  4 Days Post-Op  Subjective: Threw up all his breakfast this am.  No more N/V now, but belching a lot.  Has flatus and yellow liquid in his ostomy bag.  Abdominal pain controlled.  Discussed pathology report showing invasive adenocarcinoma.    Objective: Vital signs in last 24 hours: Temp:  [97.5 F (36.4 C)-98.3 F (36.8 C)] 98.3 F (36.8 C) (08/09 0500) Pulse Rate:  [2-87] 2 (08/09 0500) Resp:  [20-21] 21 (08/09 1048) BP: (112-134)/(53-77) 112/77 mmHg (08/09 1048) SpO2:  [98 %] 98 % (08/09 0500) Last BM Date: 04/24/15  Intake/Output from previous day: 08/08 0701 - 08/09 0700 In: 3 [I.V.:3] Out: -  Intake/Output this shift:    PE: Gen:  Alert, NAD, pleasant Abd: Soft, minimally tender, mild distension, +BS, no HSM, incisions C/D/I with serosanguinous drainage, ostomy patent and pink with yellow clear drainage and flatus in ileostomy bag.   Lab Results:   Recent Labs  04/23/15 0404  WBC 4.0  HGB 9.1*  HCT 29.6*  PLT 176   BMET  Recent Labs  04/22/15 1202 04/23/15 0404 04/23/15 1336  NA 141 139  --   K 4.2 3.6 3.6  CL 110 109  --   CO2 24 24  --   GLUCOSE 85 95  --   BUN 15 14  --   CREATININE 1.18 1.07  --   CALCIUM 8.2* 7.8*  --    PT/INR No results for input(s): LABPROT, INR in the last 72 hours. CMP     Component Value Date/Time   NA 139 04/23/2015 0404   K 3.6 04/23/2015 1336   CL 109 04/23/2015 0404   CO2 24 04/23/2015 0404   GLUCOSE 95 04/23/2015 0404   BUN 14 04/23/2015 0404   CREATININE 1.07 04/23/2015 0404   CALCIUM 7.8* 04/23/2015 0404   PROT 4.7* 04/23/2015 0404   ALBUMIN 2.2* 04/23/2015 0404   AST 31 04/23/2015 0404   ALT 9* 04/23/2015 0404   ALKPHOS 48 04/23/2015 0404   BILITOT 0.9 04/23/2015 0404   GFRNONAA >60 04/23/2015 0404   GFRAA >60 04/23/2015 0404   Lipase     Component Value Date/Time   LIPASE 17* 04/19/2015 1904       Studies/Results: No results  found.  Anti-infectives: Anti-infectives    Start     Dose/Rate Route Frequency Ordered Stop   04/21/15 1100  cefoTEtan (CEFOTAN) 2 g in dextrose 5 % 50 mL IVPB  Status:  Discontinued     2 g 100 mL/hr over 30 Minutes Intravenous  Once 04/20/15 1601 04/20/15 1658   04/20/15 1000  [MAR Hold]  cefoTEtan (CEFOTAN) 2 g in dextrose 5 % 50 mL IVPB     (MAR Hold since 04/20/15 0954)   2 g 100 mL/hr over 30 Minutes Intravenous To [facility]Stay Surgical 04/20/15 0859 04/20/15 1115       Colon, segmental resection for tumor, Right colon, sigmoid and terminal ileum - ULCERATED INVASIVE ADENOCARCINOMA, SEE COMMENT. - POSITIVE FOR LYMPH VASCULAR INVASION. - TUMOR INVADES THROUGH VISCERAL SEROSA WITH INVOLVEMENT OF ADHERED SEGMENT OF COLON. - SEVEN LYMPH NODES, POSITIVE FOR METASTATIC TUMOR (7/26). - SURGICAL MARGINS, NEGATIVE FOR TUMOR. - SEE TUMOR SYNOPTIC TEMPLATE BELOW.   Assessment/Plan SBO with ileocecal mass POD #4, s/p Subtotal colectomy with terminal ileum and ileostomy -Invasive adenocarcinoma with 7/26 lymph nodes positive - discussed results with the patient -He will need either inpatient or outpatient consultation with oncology.   -Pulm toilet  and mobilize -WOC following -Made NPO except sips/ice chips given vomiting, likely still with ileus  Jehovah's Witness Etoh - CIWA DVT Proph - SCD's and lovenox Disp - Cont. Inpt. Therapy said SNF when ready - Requests [facility]    LOS: 4 days    Nat Christen 04/24/2015, 10:56 AM Pager: [phone]

## 2015-04-24 NOTE — Consult Note (Signed)
WOC ostomy consult note Stoma type/location: RLQ, end ileostomy Stomal assessment/size: slightly larger than 1 5/8" round, budded, some mucosal sloughing at mucocutaneous junction Peristomal assessment: intact  Treatment options for stomal/peristomal skin: added 2" barrier ring today due to high volume liquid output Output tan, liquid Ostomy pouching: 1pc. With 2" barrier ring  Education provided: discussed need to stay hydrated and high risk for dehydration with ileostomy and rationale.  Pt able to recall some of the education from yesterday, however I am not sure he has been participating in emptying his pouch, encouraged to do so.  He reports his wife is not able to get around, plans per SW notes for patient to DC to SNF. Will need continued support and education for ostomy care as he will need to be independent with care, he will not have assistance at home. Today we changed the pouch, I demonstrated the steps to the patient.  Educational materials are in the room.  Extra supplies as well.  Enrolled patient in [employer] program: Yes, patient signed enrollment card  WOC will follow along with you for continued ostomy care and teaching.  Allina Riches [place] RN,CWOCN [phone]

## 2015-04-24 NOTE — Progress Notes (Signed)
The client changed cardiac rhythm from normal sinus rhythm back into atrial fibrillation. A strip was saved in epic and the night fellow was notified. The heart rate is sustaining in the 150's.

## 2015-04-25 ENCOUNTER — Telehealth: Payer: Self-pay | Admitting: Oncology

## 2015-04-25 DIAGNOSIS — I10 Essential (primary) hypertension: Secondary | ICD-10-CM

## 2015-04-25 LAB — CBC
HEMATOCRIT: 32.5 % — AB (ref 39.0–52.0)
Hemoglobin: 10.4 g/dL — ABNORMAL LOW (ref 13.0–17.0)
MCH: 24.8 pg — AB (ref 26.0–34.0)
MCHC: 32 g/dL (ref 30.0–36.0)
MCV: 77.4 fL — ABNORMAL LOW (ref 78.0–100.0)
PLATELETS: 232 10*3/uL (ref 150–400)
RBC: 4.2 MIL/uL — ABNORMAL LOW (ref 4.22–5.81)
RDW: 16.2 % — AB (ref 11.5–15.5)
WBC: 6.3 10*3/uL (ref 4.0–10.5)

## 2015-04-25 LAB — GLUCOSE, CAPILLARY
GLUCOSE-CAPILLARY: 107 mg/dL — AB (ref 65–99)
GLUCOSE-CAPILLARY: 125 mg/dL — AB (ref 65–99)
GLUCOSE-CAPILLARY: 130 mg/dL — AB (ref 65–99)
GLUCOSE-CAPILLARY: 138 mg/dL — AB (ref 65–99)
GLUCOSE-CAPILLARY: 81 mg/dL (ref 65–99)
Glucose-Capillary: 72 mg/dL (ref 65–99)

## 2015-04-25 LAB — BASIC METABOLIC PANEL
Anion gap: 12 (ref 5–15)
BUN: 18 mg/dL (ref 6–20)
CALCIUM: 8.3 mg/dL — AB (ref 8.9–10.3)
CO2: 19 mmol/L — AB (ref 22–32)
Chloride: 106 mmol/L (ref 101–111)
Creatinine, Ser: 1.26 mg/dL — ABNORMAL HIGH (ref 0.61–1.24)
GFR calc Af Amer: 60 mL/min — ABNORMAL LOW (ref >60)
GFR calc non Af Amer: 51 mL/min — ABNORMAL LOW (ref >60)
Glucose, Bld: 95 mg/dL (ref 65–99)
Potassium: 3.7 mmol/L (ref 3.5–5.1)
SODIUM: 137 mmol/L (ref 135–145)

## 2015-04-25 MED ORDER — DEXTROSE-NACL 5-0.9 % IV SOLN
INTRAVENOUS | Status: DC
  Administered 2015-04-25 (×2): via INTRAVENOUS

## 2015-04-25 MED ORDER — PHENOL 1.4 % MT LIQD
1.0000 | OROMUCOSAL | Status: DC | PRN
  Filled 2015-04-25: qty 177

## 2015-04-25 MED ORDER — MENTHOL 3 MG MT LOZG
1.0000 | LOZENGE | OROMUCOSAL | Status: DC | PRN
  Filled 2015-04-25 (×2): qty 9

## 2015-04-25 NOTE — Progress Notes (Signed)
[facility] PROGRESS NOTE  Harold Price OMV:[id number] DOB: 06/26/1930 DOA: 04/19/2015 PCP: No PCP Per Patient HPI/Subjective: [name] PMHx Depression,HTN, ETOH Abuse, PUD, Jehovah's witness,   Presents with nausea, vomiting, abdominal distention and abdominal pain.  Patient reports that his symptoms started 2 days ago. He vomited 6-7 times a day today. His abdominal pain is located in the periumbilical area, constant, 5 out of 10 in severity, nonradiating. It is not aggravated or alleviated by any known factors. He does not have symptoms of UTI, no fever, chills, chest pain, shortness breath, diarrhea. No unilateral weakness, leg edema or rashes.  In ED, patient was found to have WBC 12.9, temperature normal, no tachycardia, electrolytes okay, is 19, negative urinalysis. CT abdomen/pelvis showed high-grade small bowel obstruction caused by a mass of the distal ileum or ileocecal valve. Multiple low-attenuation foci in the liver, more likely benign cysts but the smaller ones cannot be conclusively characterized. Patient is admitted to inpatient for further evaluation and treatment. General surgery was consulted by ED.   Having some vomiting this AM   Assessment/Plan: Small bowel obstruction secondary to small bowel mass: -Pathology: ULCERATED INVASIVE ADENOCARCINOMA, SEE COMMENT. - POSITIVE FOR LYMPH VASCULAR INVASION -NG tube replaced -morphine prn pain  -Continue Normal saline 125 ml/hr, until patient able to consume enough fluids to keep up with needs. -oncology consult- Dr Mckinley Jewel to arrange follow up in GI oncology clinic  Alcohol abuse: -Did counseling about the importance of quitting drinking D/c CIWA protocol -Echocardiogram done EF preserved  Hx of PUD and duodenal ulcer with hemorrhage: -Continue IV protonix 40 mg daily  Hypetension/hypotension: -Improved with hydration. Would continue hydration until patient able to begin consuming PO  A-Fib with RVR -  cardiazem gtt -Continue to monitor closely. Most likely cause of patient's A. fib is surgical induce stress. -start POs when able to take  LVH -See A. fib with RVR  Hypokalemia replete  Hypophosphatemia  replete  Depression:  -Not taking med at home. Stable, no suicidal or homicidal ideations. -Observe closely      Code Status: Full Family Communication: None  Disposition Plan: SNF when medically ready   Consultants:  Dr.Matthew Donne Hazel ([facility])    Procedures: 8/5 :Small bowel obstruction, ileocecal mass 8/7 echocardiogram;- Left ventricle: moderate concentric hypertrophy. -LVEF= 55% to 60%.  -(grade 1diastolic dysfunction) - Atrial septum: A patent foramen ovale cannot be excluded.   Cultures   Antibiotics: NA   DVT prophylaxis SCD    Objective: Filed Vitals:   04/24/15 1048 04/24/15 1818 04/25/15 0000 04/25/15 0454  BP: 112/77 145/51 140/38 136/39  Pulse:   95 91  Temp:   97.6 F (36.4 C) 98 F (36.7 C)  TempSrc:   Axillary Oral  Resp: 21 21 19 20   Height:      Weight:      SpO2:   96% 95%    Intake/Output Summary (Last 24 hours) at 04/25/15 1128 Last data filed at 04/25/15 0630  Gross per 24 hour  Intake 8996.62 ml  Output   4350 ml  Net 4646.62 ml   Filed Weights   04/20/15 0445  Weight: 99.2 kg (218 lb 11.1 oz)     Exam: General: A/O 4, NG tube in place Lungs: Clear to auscultation bilaterally without wheezes or crackles Cardiovascular: Regular rate and rhythm without murmur gallop or rub normal S1 and S2 Abdomen:  surgical dressing in place with old sanguineous fluid coating about 1/2 way up. Colostomy bag in place, stomal pain  viable. Extremities: No significant cyanosis, clubbing, or edema bilateral lower extremities    Data Reviewed: Basic Metabolic Panel:  Recent Labs Lab 04/20/15 0730 04/21/15 0317 04/22/15 1202 04/23/15 0404 04/23/15 1336 04/25/15 0011  NA 139 142 141 139  --  137  K 3.5 4.0 4.2 3.6 3.6 3.7   CL 101 107 110 109  --  106  CO2 28 27 24 24   --  19*  GLUCOSE 127* 151* 85 95  --  95  BUN 9 15 15 14   --  18  CREATININE 1.11 1.47* 1.18 1.07  --  1.26*  CALCIUM 9.3 8.1* 8.2* 7.8*  --  8.3*  MG  --   --   --  2.5* 2.6*  --   PHOS  --   --   --  1.7*  --   --    Liver Function Tests:  Recent Labs Lab 04/19/15 1904 04/20/15 0730 04/21/15 0317 04/23/15 0404  AST 24 17 21 31   ALT 8* 6* 8* 9*  ALKPHOS 60 56 37* 48  BILITOT 1.1 0.6 0.9 0.9  PROT 7.0 6.5 4.6* 4.7*  ALBUMIN 3.9 3.7 2.6* 2.2*    Recent Labs Lab 04/19/15 1904  LIPASE 17*   No results for input(s): AMMONIA in the last 168 hours. CBC:  Recent Labs Lab 04/19/15 1904 04/20/15 0730 04/21/15 0317 04/23/15 0404 04/25/15 0011  WBC 12.9* 11.6* 13.8* 4.0 6.3  NEUTROABS  --   --   --  2.7  --   HGB 13.7 13.1 11.4* 9.1* 10.4*  HCT 41.6 41.7 36.3* 29.6* 32.5*  MCV 76.8* 77.7* 79.3 78.3 77.4*  PLT 177 155 158 176 232   Cardiac Enzymes: No results for input(s): CKTOTAL, CKMB, CKMBINDEX, TROPONINI in the last 168 hours. BNP (last 3 results) No results for input(s): BNP in the last 8760 hours.  ProBNP (last 3 results) No results for input(s): PROBNP in the last 8760 hours.  CBG:  Recent Labs Lab 04/24/15 1148 04/24/15 2205 04/25/15 0030 04/25/15 0446 04/25/15 0752  GLUCAP 95 84 81 72 107*    No results found for this or any previous visit (from the past 240 hour(s)).   Studies: Dg Abd Portable 1v  04/24/2015   CLINICAL DATA:  Vomiting.  Nasogastric tube placement  EXAM: PORTABLE ABDOMEN - 1 VIEW  COMPARISON:  CT abdomen and pelvis April 20, 2015  FINDINGS: Nasogastric tube tip and side port are in the stomach. There are multiple loops of dilated bowel consistent with either ileus or a degree of obstruction. No free air is seen on this supine examination. There is bibasilar atelectatic change.  IMPRESSION: Nasogastric tube tip and side port in stomach. Multiple loops of dilated bowel consistent with  either ileus or a degree of bowel obstruction.   Electronically Signed   By: Lowella Grip III M.D.   On: 04/24/2015 21:39    Scheduled Meds: . antiseptic oral rinse  7 mL Mouth Rinse q12n4p  . chlorhexidine  15 mL Mouth/Throat BID  . enoxaparin (LOVENOX) injection  40 mg Subcutaneous Q24H  . folic acid  1 mg Oral Daily  . multivitamin with minerals  1 tablet Oral Daily  . ondansetron (ZOFRAN) IV  4 mg Intravenous 4 times per day  . pantoprazole (PROTONIX) IV  40 mg Intravenous Q24H  . sodium chloride  3 mL Intravenous Q12H  . thiamine  100 mg Oral Daily   Continuous Infusions: . dextrose 5 % and 0.9% NaCl 125  mL/hr at 04/25/15 0538  . diltiazem (CARDIZEM) infusion 10 mg/hr (04/25/15 0330)  . lactated ringers 10 mL/hr at 04/20/15 1003    Principal Problem:   Small bowel obstruction Active Problems:   Alcohol abuse   Refusal of blood transfusions as patient is Jehovah's Witness   Duodenal ulcer with hemorrhage   Hypertension   PUD (peptic ulcer disease)   Depression   Small bowel mass   Leukocytosis   Cancer of abdominal organ   Hypokalemia   LVH (left ventricular hypertrophy)   Hypophosphatemia    Time spent: 25 minutes   Eliah Marquard  [facility] Pager [phone] If 7PM-7AM, please contact night-coverage at [email or web address], password [facility] 04/25/2015, 11:28 AM  LOS: 5 days

## 2015-04-25 NOTE — Progress Notes (Signed)
[facility], pt will have an appointment on May 08, 2015 at 1330 PM.  thanks

## 2015-04-25 NOTE — Progress Notes (Signed)
Physical Therapy Treatment Patient Details Name: Laura Radilla MRN: [id number] DOB: November 12, 1929 Today's Date: 04/25/2015    History of Present Illness 81 y.o. male s/p subtotal colectomy, including terminal ileum, end ileostomy. Post op course complicated by ileus with NG tube placement.     PT Comments    Pt is progressing well with gait distance and stability.  He was up on his feet for a long time during this session due to toileting and pericare issues, but was able to tolerate it with the support of the RW.  He does continue to be appropriate for SNF placement for rehab at discharge. PT will continue to follow acutely.   Follow Up Recommendations  SNF     Equipment Recommendations  None recommended by PT    Recommendations for Other Services   NA     Precautions / Restrictions Precautions Precautions: Fall    Mobility           Transfers Overall transfer level: Needs assistance Equipment used: Rolling walker (2 wheeled) Transfers: Sit to/from Stand Sit to Stand: Min assist         General transfer comment: Min assist from low recliner chair after multiple repetitions due to fatigue.  Started as min guard assist.  Pt with heavy reliance on upper extremity support for transitions.   Ambulation/Gait Ambulation/Gait assistance: Min assist Ambulation Distance (Feet): 100 Feet Assistive device: Rolling walker (2 wheeled) Gait Pattern/deviations: Step-through pattern;Shuffle;Trunk flexed Gait velocity: decreased Gait velocity interpretation: Below normal speed for age/gender General Gait Details: Verbal cues for upright posture during gait.  Pt's HR was in the high 110s.  Some mild DOE with gait 2/4,  O2 sats stable.           Balance           Standing balance support: Bilateral upper extremity supported;No upper extremity supported;Single extremity supported Standing balance-Leahy Scale: Fair                      Cognition Arousal/Alertness:  Awake/alert Behavior During Therapy: WFL for tasks assessed/performed Overall Cognitive Status: Within Functional Limits for tasks assessed (not specifically tested)                         General Comments General comments (skin integrity, edema, etc.): Pt with incontinent stool episode when standing to use urinal.  It came from his rectum, so there is some residual leakage to be aware of.       Pertinent Vitals/Pain Pain Assessment: No/denies pain ("sore" in his abdomen)           PT Goals (current goals can now be found in the care plan section) Acute Rehab PT Goals Patient Stated Goal: Get better and go home Progress towards PT goals: Progressing toward goals    Frequency  Min 3X/week    PT Plan Current plan remains appropriate       End of Session   Activity Tolerance: Patient limited by fatigue Patient left: in chair;with call bell/phone within reach     Time: 1113-1145 PT Time Calculation (min) (ACUTE ONLY): 32 min  Charges:  $Gait Training: 8-22 mins $Therapeutic Activity: 8-22 mins             Kyser Wandel B. [place], [place], DPT [phone]   04/25/2015, 12:03 PM

## 2015-04-25 NOTE — Telephone Encounter (Signed)
new patient appt-s/w nurse and gave np appt for 08/23 @ 1:30 w/Dr. Benay Spice. Appt d/t will be printed out on discharge summary

## 2015-04-25 NOTE — Progress Notes (Signed)
Patient ID: Harold Price, male   DOB: [birth year], 81 y.o.   MRN: [id number] 5 Days Post-Op  Subjective: Pt feels better with NGT in place, had 2800cc out.  Objective: Vital signs in last 24 hours: Temp:  [97.6 F (36.4 C)-98 F (36.7 C)] 98 F (36.7 C) (08/10 0454) Pulse Rate:  [91-95] 91 (08/10 0454) Resp:  [19-21] 20 (08/10 0454) BP: (112-145)/(38-77) 136/39 mmHg (08/10 0454) SpO2:  [95 %-96 %] 95 % (08/10 0454) Last BM Date: 04/24/15  Intake/Output from previous day: 08/09 0701 - 08/10 0700 In: 81448.1 [P.O.:240; I.V.:10742.2] Out: 4350 [Urine:300; Emesis/NG output:2800; Stool:1250] Intake/Output this shift:    PE: Abd: soft, +BS, ileostomy with clear water like output, midline incision c/d/i with staples Heart: regular  Lab Results:   Recent Labs  04/23/15 0404 04/25/15 0011  WBC 4.0 6.3  HGB 9.1* 10.4*  HCT 29.6* 32.5*  PLT 176 232   BMET  Recent Labs  04/23/15 0404 04/23/15 1336 04/25/15 0011  NA 139  --  137  K 3.6 3.6 3.7  CL 109  --  106  CO2 24  --  19*  GLUCOSE 95  --  95  BUN 14  --  18  CREATININE 1.07  --  1.26*  CALCIUM 7.8*  --  8.3*   PT/INR No results for input(s): LABPROT, INR in the last 72 hours. CMP     Component Value Date/Time   NA 137 04/25/2015 0011   K 3.7 04/25/2015 0011   CL 106 04/25/2015 0011   CO2 19* 04/25/2015 0011   GLUCOSE 95 04/25/2015 0011   BUN 18 04/25/2015 0011   CREATININE 1.26* 04/25/2015 0011   CALCIUM 8.3* 04/25/2015 0011   PROT 4.7* 04/23/2015 0404   ALBUMIN 2.2* 04/23/2015 0404   AST 31 04/23/2015 0404   ALT 9* 04/23/2015 0404   ALKPHOS 48 04/23/2015 0404   BILITOT 0.9 04/23/2015 0404   GFRNONAA 51* 04/25/2015 0011   GFRAA 60* 04/25/2015 0011   Lipase     Component Value Date/Time   LIPASE 17* 04/19/2015 1904       Studies/Results: Dg Abd Portable 1v  04/24/2015   CLINICAL DATA:  Vomiting.  Nasogastric tube placement  EXAM: PORTABLE ABDOMEN - 1 VIEW  COMPARISON:  CT abdomen and pelvis  April 20, 2015  FINDINGS: Nasogastric tube tip and side port are in the stomach. There are multiple loops of dilated bowel consistent with either ileus or a degree of obstruction. No free air is seen on this supine examination. There is bibasilar atelectatic change.  IMPRESSION: Nasogastric tube tip and side port in stomach. Multiple loops of dilated bowel consistent with either ileus or a degree of bowel obstruction.   Electronically Signed   By: Lowella Grip III M.D.   On: 04/24/2015 21:39    Anti-infectives: Anti-infectives    Start     Dose/Rate Route Frequency Ordered Stop   04/21/15 1100  cefoTEtan (CEFOTAN) 2 g in dextrose 5 % 50 mL IVPB  Status:  Discontinued     2 g 100 mL/hr over 30 Minutes Intravenous  Once 04/20/15 1601 04/20/15 1658   04/20/15 1000  [MAR Hold]  cefoTEtan (CEFOTAN) 2 g in dextrose 5 % 50 mL IVPB     (MAR Hold since 04/20/15 0954)   2 g 100 mL/hr over 30 Minutes Intravenous To ShortStay Surgical 04/20/15 0859 04/20/15 1115       Assessment/Plan  SBO with ileocecal mass POD #5, s/p Subtotal colectomy  with terminal ileum and ileostomy -Invasive adenocarcinoma with 7/26 lymph nodes positive - discussed results with the patient -He will need either inpatient or outpatient consultation with oncology.  -Pulm toilet and mobilize -WOC following -NGT replaced with x-ray evidence of ileus.  Await better bowel function Jehovah's Witness DVT Proph - SCD's and lovenox   LOS: 5 days    Eastin Swing E 04/25/2015, 9:37 AM Pager: [phone]

## 2015-04-26 LAB — CBC
HEMATOCRIT: 31.3 % — AB (ref 39.0–52.0)
Hemoglobin: 10 g/dL — ABNORMAL LOW (ref 13.0–17.0)
MCH: 24.9 pg — AB (ref 26.0–34.0)
MCHC: 31.9 g/dL (ref 30.0–36.0)
MCV: 77.9 fL — AB (ref 78.0–100.0)
Platelets: 252 10*3/uL (ref 150–400)
RBC: 4.02 MIL/uL — AB (ref 4.22–5.81)
RDW: 16.1 % — ABNORMAL HIGH (ref 11.5–15.5)
WBC: 6.5 10*3/uL (ref 4.0–10.5)

## 2015-04-26 LAB — BASIC METABOLIC PANEL
ANION GAP: 9 (ref 5–15)
BUN: 14 mg/dL (ref 6–20)
CHLORIDE: 106 mmol/L (ref 101–111)
CO2: 25 mmol/L (ref 22–32)
Calcium: 8.3 mg/dL — ABNORMAL LOW (ref 8.9–10.3)
Creatinine, Ser: 1.11 mg/dL (ref 0.61–1.24)
GFR calc non Af Amer: 60 mL/min — ABNORMAL LOW (ref >60)
Glucose, Bld: 145 mg/dL — ABNORMAL HIGH (ref 65–99)
POTASSIUM: 3.1 mmol/L — AB (ref 3.5–5.1)
Sodium: 140 mmol/L (ref 135–145)

## 2015-04-26 LAB — GLUCOSE, CAPILLARY
GLUCOSE-CAPILLARY: 135 mg/dL — AB (ref 65–99)
GLUCOSE-CAPILLARY: 143 mg/dL — AB (ref 65–99)
Glucose-Capillary: 137 mg/dL — ABNORMAL HIGH (ref 65–99)
Glucose-Capillary: 138 mg/dL — ABNORMAL HIGH (ref 65–99)
Glucose-Capillary: 138 mg/dL — ABNORMAL HIGH (ref 65–99)
Glucose-Capillary: 163 mg/dL — ABNORMAL HIGH (ref 65–99)

## 2015-04-26 MED ORDER — POTASSIUM CHLORIDE 10 MEQ/100ML IV SOLN
10.0000 meq | INTRAVENOUS | Status: AC
  Administered 2015-04-26 (×4): 10 meq via INTRAVENOUS
  Filled 2015-04-26 (×4): qty 100

## 2015-04-26 MED ORDER — FOLIC ACID 5 MG/ML IJ SOLN
1.0000 mg | Freq: Every day | INTRAMUSCULAR | Status: DC
  Administered 2015-04-26 – 2015-04-29 (×3): 1 mg via INTRAVENOUS
  Filled 2015-04-26 (×5): qty 0.2

## 2015-04-26 MED ORDER — THIAMINE HCL 100 MG/ML IJ SOLN
100.0000 mg | Freq: Every day | INTRAMUSCULAR | Status: DC
  Administered 2015-04-26: 100 mg via INTRAVENOUS
  Filled 2015-04-26: qty 2

## 2015-04-26 MED ORDER — DEXTROSE 5 % IV SOLN
5.0000 mg/h | INTRAVENOUS | Status: DC
  Administered 2015-04-26: 5 mg/h via INTRAVENOUS
  Administered 2015-04-27 (×2): 15 mg/h via INTRAVENOUS
  Administered 2015-04-27 (×2): 5 mg/h via INTRAVENOUS
  Filled 2015-04-26: qty 100

## 2015-04-26 MED ORDER — MAGIC MOUTHWASH W/LIDOCAINE
5.0000 mL | Freq: Four times a day (QID) | ORAL | Status: DC | PRN
  Filled 2015-04-26: qty 5

## 2015-04-26 NOTE — Consult Note (Addendum)
WOC ostomy follow up Stoma type/location: RLQ, end ileostomy Pt has had ileus and has NG tube in place now.  Stomal assessment/size:  1 5/8" round, budded Peristomal assessment: did not change pouch, changed per bedside nursing staff  Treatment options for stomal/peristomal skin: NA Output high volume green liquid stool Pouch was about to explode today when I entered room, encouraged patient to notify staff when the pouch is getting full  Ostomy pouching: 1pc .Education provided: reinforced that he needs to have the pouch emptied when 1/3 to 1/2 full.   Enrolled patient in [place] Start Discharge program: Yes  Central team will follow along with you for support with ostomy care and teaching.  [place], [place]

## 2015-04-26 NOTE — Progress Notes (Addendum)
[facility] PROGRESS NOTE  Harold Price YDX:[id number] DOB: October 08, 1929 DOA: 04/19/2015 PCP: No PCP Per Patient HPI/Subjective: [name] PMHx Depression,HTN, ETOH Abuse, PUD, Jehovah's witness,   Presents with nausea, vomiting, abdominal distention and abdominal pain.  Patient reports that his symptoms started 2 days ago. He vomited 6-7 times a day today. His abdominal pain is located in the periumbilical area, constant, 5 out of 10 in severity, nonradiating. It is not aggravated or alleviated by any known factors. He does not have symptoms of UTI, no fever, chills, chest pain, shortness breath, diarrhea. No unilateral weakness, leg edema or rashes.  In ED, patient was found to have WBC 12.9, temperature normal, no tachycardia, electrolytes okay, is 19, negative urinalysis. CT abdomen/pelvis showed high-grade small bowel obstruction caused by a mass of the distal ileum or ileocecal valve. Multiple low-attenuation foci in the liver, more likely benign cysts but the smaller ones cannot be conclusively characterized.   Assessment/Plan: Small bowel obstruction secondary to small bowel mass: -Pathology: ULCERATED INVASIVE ADENOCARCINOMA, SEE COMMENT. - POSITIVE FOR LYMPH VASCULAR INVASION -NG tube replaced -morphine prn pain  Decrease IVF -oncology consult- Dr Mckinley Jewel to arrange follow up in GI oncology clinic On sips of clears.  NG clamped. Ileostomy with output.  Alcohol abuse: -Did counseling about the importance of quitting drinking No evidence of withdrawal -Echocardiogram done EF preserved  Hx of PUD and duodenal ulcer with hemorrhage: -Continue IV protonix 40 mg daily  Hypetension/hypotension: None furhter  A-Fib with RVR - cardiazem gtt -Continue to monitor closely. Most likely cause of patient's A. Fib. chadsvasc 3. Not a candidate for anticoagulation at this time (postop, fall risk, EtOH, jehovah's witness) -start POs when able to take  LVH -See A. fib with  RVR  Hypokalemia Replete IV  Hypophosphatemia  corrected  Depression:  -Not taking med at home. Stable, no suicidal or homicidal ideations. -Observe closely  Code Status: Full Family Communication: None  Disposition Plan: SNF when medically ready   Consultants:  Dr.Matthew Donne Hazel ([facility])    Procedures: #1 subtotal colectomy, including terminal ileum #2 end ileostomy 8/7 echocardiogram;- Left ventricle: moderate concentric hypertrophy. -LVEF= 55% to 60%.  -(grade 1diastolic dysfunction) - Atrial septum: A patent foramen ovale cannot be excluded.   Cultures   Antibiotics: NA   DVT prophylaxis SCD  S: c/o sore throat. No n/v  Objective: Filed Vitals:   04/26/15 0751 04/26/15 1155 04/26/15 1619 04/26/15 1620  BP:  121/57 128/51 128/51  Pulse:   84   Temp: 98.3 F (36.8 C)  98.8 F (37.1 C)   TempSrc: Oral  Oral   Resp:   21   Height:      Weight: 96.072 kg (211 lb 12.8 oz)     SpO2:   95%     Intake/Output Summary (Last 24 hours) at 04/26/15 1807 Last data filed at 04/26/15 1619  Gross per 24 hour  Intake      0 ml  Output   5085 ml  Net  -5085 ml   Filed Weights   04/20/15 0445 04/26/15 0751  Weight: 99.2 kg (218 lb 11.1 oz) 96.072 kg (211 lb 12.8 oz)     Exam: General: A/O 4, NG tube in placein chair Lungs: Clear to auscultation bilaterally without wheezes or crackles Cardiovascular: Regular rate and rhythm without murmur gallop or rub normal S1 and S2 Abdomen:  Incision ok. Ileostomy bag full. Bowel sounds present. Soft, nontender. Extremities: No significant cyanosis, clubbing, or edema bilateral lower extremities  Data Reviewed: Basic Metabolic Panel:  Recent Labs Lab 04/21/15 0317 04/22/15 1202 04/23/15 0404 04/23/15 1336 04/25/15 0011 04/26/15 0425  NA 142 141 139  --  137 140  K 4.0 4.2 3.6 3.6 3.7 3.1*  CL 107 110 109  --  106 106  CO2 27 24 24   --  19* 25  GLUCOSE 151* 85 95  --  95 145*  BUN 15 15 14   --  18 14   CREATININE 1.47* 1.18 1.07  --  1.26* 1.11  CALCIUM 8.1* 8.2* 7.8*  --  8.3* 8.3*  MG  --   --  2.5* 2.6*  --   --   PHOS  --   --  1.7*  --   --   --    Liver Function Tests:  Recent Labs Lab 04/19/15 1904 04/20/15 0730 04/21/15 0317 04/23/15 0404  AST 24 17 21 31   ALT 8* 6* 8* 9*  ALKPHOS 60 56 37* 48  BILITOT 1.1 0.6 0.9 0.9  PROT 7.0 6.5 4.6* 4.7*  ALBUMIN 3.9 3.7 2.6* 2.2*    Recent Labs Lab 04/19/15 1904  LIPASE 17*   No results for input(s): AMMONIA in the last 168 hours. CBC:  Recent Labs Lab 04/20/15 0730 04/21/15 0317 04/23/15 0404 04/25/15 0011 04/26/15 0425  WBC 11.6* 13.8* 4.0 6.3 6.5  NEUTROABS  --   --  2.7  --   --   HGB 13.1 11.4* 9.1* 10.4* 10.0*  HCT 41.7 36.3* 29.6* 32.5* 31.3*  MCV 77.7* 79.3 78.3 77.4* 77.9*  PLT 155 158 176 232 252   Cardiac Enzymes: No results for input(s): CKTOTAL, CKMB, CKMBINDEX, TROPONINI in the last 168 hours. BNP (last 3 results) No results for input(s): BNP in the last 8760 hours.  ProBNP (last 3 results) No results for input(s): PROBNP in the last 8760 hours.  CBG:  Recent Labs Lab 04/26/15 0014 04/26/15 0426 04/26/15 0720 04/26/15 1150 04/26/15 1622  GLUCAP 138* 135* 137* 143* 163*    No results found for this or any previous visit (from the past 240 hour(s)).   Studies: Dg Abd Portable 1v  04/24/2015   CLINICAL DATA:  Vomiting.  Nasogastric tube placement  EXAM: PORTABLE ABDOMEN - 1 VIEW  COMPARISON:  CT abdomen and pelvis April 20, 2015  FINDINGS: Nasogastric tube tip and side port are in the stomach. There are multiple loops of dilated bowel consistent with either ileus or a degree of obstruction. No free air is seen on this supine examination. There is bibasilar atelectatic change.  IMPRESSION: Nasogastric tube tip and side port in stomach. Multiple loops of dilated bowel consistent with either ileus or a degree of bowel obstruction.   Electronically Signed   By: Lowella Grip III M.D.   On:  04/24/2015 21:39    Scheduled Meds: . antiseptic oral rinse  7 mL Mouth Rinse q12n4p  . chlorhexidine  15 mL Mouth/Throat BID  . enoxaparin (LOVENOX) injection  40 mg Subcutaneous Q24H  . folic acid  1 mg Intravenous Daily  . multivitamin with minerals  1 tablet Oral Daily  . ondansetron (ZOFRAN) IV  4 mg Intravenous 4 times per day  . pantoprazole (PROTONIX) IV  40 mg Intravenous Q24H  . sodium chloride  3 mL Intravenous Q12H  . thiamine IV  100 mg Intravenous Daily   Continuous Infusions: . dextrose 5 % and 0.9% NaCl 125 mL/hr at 04/25/15 2142  . diltiazem (CARDIZEM) infusion 5 mg/hr (04/26/15 0253)  .  lactated ringers 10 mL/hr at 04/20/15 1003    Principal Problem:   Small bowel obstruction Active Problems:   Alcohol abuse   Refusal of blood transfusions as patient is Jehovah's Witness   Duodenal ulcer with hemorrhage   Hypertension   PUD (peptic ulcer disease)   Depression   Small bowel mass   Leukocytosis   Cancer of abdominal organ   Hypokalemia   LVH (left ventricular hypertrophy)   Hypophosphatemia    Time spent: 25 minutes   [facility]  [email or web address], password [facility] 04/26/2015, 6:07 PM  LOS: 6 days

## 2015-04-26 NOTE — Progress Notes (Signed)
Pt has converted to NSR; EKG obtained this am to confirm. IV cardizem is infusing at 5 mcg/min

## 2015-04-26 NOTE — Care Management Important Message (Signed)
Important Message  Patient Details  Name: Harold Price MRN: [id number] Date of Birth: [birth year]   Medicare Important Message Given:  Yes-third notification given    Pricilla Handler 04/26/2015, 2:13 PM

## 2015-04-26 NOTE — Progress Notes (Signed)
[facility] Progress Note  6 Days Post-Op  Subjective: Pt feels better.  No N/V, but coughing up clear phlegm.  Pain improved.  Up in chair most of the day so far.  Ostomy working (1032mL clear green), NG put out 978mL in last 24 hours, but only 400 since yesterday night.  Nurse brought him an IS to use.  Wants to know when the cancer doctor will see him.  Objective: Vital signs in last 24 hours: Temp:  [98.1 F (36.7 C)-98.4 F (36.9 C)] 98.3 F (36.8 C) (08/11 0751) Pulse Rate:  [75-92] 75 (08/11 0439) Resp:  [18-22] 22 (08/11 0723) BP: (118-145)/(35-41) 138/36 mmHg (08/11 0723) SpO2:  [94 %-97 %] 95 % (08/11 0439) Weight:  [96.072 kg (211 lb 12.8 oz)] 96.072 kg (211 lb 12.8 oz) (08/11 0751) Last BM Date: 04/24/15  Intake/Output from previous day: 08/10 0701 - 08/11 0700 In: -  Out: 4185 [Urine:2250; Emesis/NG output:910; Stool:1025] Intake/Output this shift: Total I/O In: -  Out: 400 [Stool:400]  PE: Gen:  Alert, NAD, pleasant Card:  RRR, no M/G/R heard Pulm:  CTA, no W/R/R, great effort Abd: Soft, minimally tender, mild distension, +BS, no HSM, incisions C/D/I with staples in place, ostomy patent and pink with green clear liquid drainage and flatus in ileostomy bag.   Lab Results:   Recent Labs  04/25/15 0011 04/26/15 0425  WBC 6.3 6.5  HGB 10.4* 10.0*  HCT 32.5* 31.3*  PLT 232 252   BMET  Recent Labs  04/25/15 0011 04/26/15 0425  NA 137 140  K 3.7 3.1*  CL 106 106  CO2 19* 25  GLUCOSE 95 145*  BUN 18 14  CREATININE 1.26* 1.11  CALCIUM 8.3* 8.3*   PT/INR No results for input(s): LABPROT, INR in the last 72 hours. CMP     Component Value Date/Time   NA 140 04/26/2015 0425   K 3.1* 04/26/2015 0425   CL 106 04/26/2015 0425   CO2 25 04/26/2015 0425   GLUCOSE 145* 04/26/2015 0425   BUN 14 04/26/2015 0425   CREATININE 1.11 04/26/2015 0425   CALCIUM 8.3* 04/26/2015 0425   PROT 4.7* 04/23/2015 0404   ALBUMIN 2.2* 04/23/2015 0404   AST 31 04/23/2015 0404   ALT 9* 04/23/2015 0404   ALKPHOS 48 04/23/2015 0404   BILITOT 0.9 04/23/2015 0404   GFRNONAA 60* 04/26/2015 0425   GFRAA >60 04/26/2015 0425   Lipase     Component Value Date/Time   LIPASE 17* 04/19/2015 1904       Studies/Results: Dg Abd Portable 1v  04/24/2015   CLINICAL DATA:  Vomiting.  Nasogastric tube placement  EXAM: PORTABLE ABDOMEN - 1 VIEW  COMPARISON:  CT abdomen and pelvis April 20, 2015  FINDINGS: Nasogastric tube tip and side port are in the stomach. There are multiple loops of dilated bowel consistent with either ileus or a degree of obstruction. No free air is seen on this supine examination. There is bibasilar atelectatic change.  IMPRESSION: Nasogastric tube tip and side port in stomach. Multiple loops of dilated bowel consistent with either ileus or a degree of bowel obstruction.   Electronically Signed   By: Lowella Grip III M.D.   On: 04/24/2015 21:39    Anti-infectives: Anti-infectives    Start     Dose/Rate Route Frequency Ordered Stop   04/21/15 1100  cefoTEtan (CEFOTAN) 2 g in dextrose 5 % 50 mL IVPB  Status:  Discontinued     2 g 100 mL/hr  over 30 Minutes Intravenous  Once 04/20/15 1601 04/20/15 1658   04/20/15 1000  [MAR Hold]  cefoTEtan (CEFOTAN) 2 g in dextrose 5 % 50 mL IVPB     (MAR Hold since 04/20/15 0954)   2 g 100 mL/hr over 30 Minutes Intravenous To [facility] 04/20/15 0859 04/20/15 1115       Assessment/Plan SBO with ileocecal mass POD #6, s/p Subtotal colectomy with terminal ileum and ileostomy -Invasive adenocarcinoma with 7/26 lymph nodes positive - discussed results with the patient -He will need either inpatient or outpatient consultation with oncology, will leave this up to primary to discuss with oncology their preference.  The patient prefers to talk to them sooner than later. -Pulm toilet and mobilize -WOC following -Much more liquid stool output now.  Clamp NG tube, allow sips of clears  during clamping trial.  Will likely keep clamped for 24 hours.  Consider d/c tomorrow if doing well.  Await better bowel function -Staples to come out on POD #10 -KUB in am  Jehovah's Witness DVT Proph - SCD's and lovenox     LOS: 6 days    Nat Christen 04/26/2015, 10:51 AM Pager: [phone]

## 2015-04-26 NOTE — Progress Notes (Signed)
Pt has been drinking apple juice today (see I and O) without any complications. Etta Quill, RN

## 2015-04-27 ENCOUNTER — Inpatient Hospital Stay (HOSPITAL_COMMUNITY): Payer: Medicare Other

## 2015-04-27 ENCOUNTER — Encounter (HOSPITAL_COMMUNITY): Payer: Self-pay

## 2015-04-27 LAB — CBC WITH DIFFERENTIAL/PLATELET
BASOS ABS: 0 10*3/uL (ref 0.0–0.1)
BASOS PCT: 0 % (ref 0–1)
Basophils Absolute: 0 10*3/uL (ref 0.0–0.1)
Basophils Relative: 0 % (ref 0–1)
EOS PCT: 0 % (ref 0–5)
Eosinophils Absolute: 0 10*3/uL (ref 0.0–0.7)
Eosinophils Absolute: 0 10*3/uL (ref 0.0–0.7)
Eosinophils Relative: 0 % (ref 0–5)
HCT: 30.2 % — ABNORMAL LOW (ref 39.0–52.0)
HEMATOCRIT: 27.3 % — AB (ref 39.0–52.0)
Hemoglobin: 8.6 g/dL — ABNORMAL LOW (ref 13.0–17.0)
Hemoglobin: 9.5 g/dL — ABNORMAL LOW (ref 13.0–17.0)
LYMPHS PCT: 4 % — AB (ref 12–46)
LYMPHS PCT: 5 % — AB (ref 12–46)
Lymphs Abs: 1.3 10*3/uL (ref 0.7–4.0)
Lymphs Abs: 1.5 10*3/uL (ref 0.7–4.0)
MCH: 24.1 pg — AB (ref 26.0–34.0)
MCH: 24.7 pg — ABNORMAL LOW (ref 26.0–34.0)
MCHC: 31.5 g/dL (ref 30.0–36.0)
MCHC: 31.5 g/dL (ref 30.0–36.0)
MCV: 76.5 fL — ABNORMAL LOW (ref 78.0–100.0)
MCV: 78.6 fL (ref 78.0–100.0)
MONO ABS: 1.5 10*3/uL — AB (ref 0.1–1.0)
Monocytes Absolute: 2.3 10*3/uL — ABNORMAL HIGH (ref 0.1–1.0)
Monocytes Relative: 7 % (ref 3–12)
Monocytes Relative: 5 % (ref 3–12)
NEUTROS PCT: 89 % — AB (ref 43–77)
NEUTROS PCT: 90 % — AB (ref 43–77)
Neutro Abs: 29.5 10*3/uL — ABNORMAL HIGH (ref 1.7–7.7)
Neutro Abs: 27.3 10*3/uL — ABNORMAL HIGH (ref 1.7–7.7)
PLATELETS: 218 10*3/uL (ref 150–400)
Platelets: 226 10*3/uL (ref 150–400)
RBC: 3.57 MIL/uL — ABNORMAL LOW (ref 4.22–5.81)
RBC: 3.84 MIL/uL — ABNORMAL LOW (ref 4.22–5.81)
RDW: 16 % — AB (ref 11.5–15.5)
RDW: 16.4 % — AB (ref 11.5–15.5)
WBC MORPHOLOGY: INCREASED
WBC Morphology: INCREASED
WBC: 33.1 10*3/uL — ABNORMAL HIGH (ref 4.0–10.5)
WBC: 30.3 10*3/uL — ABNORMAL HIGH (ref 4.0–10.5)

## 2015-04-27 LAB — BASIC METABOLIC PANEL
Anion gap: 7 (ref 5–15)
BUN: 8 mg/dL (ref 6–20)
CO2: 28 mmol/L (ref 22–32)
Calcium: 8.6 mg/dL — ABNORMAL LOW (ref 8.9–10.3)
Chloride: 105 mmol/L (ref 101–111)
Creatinine, Ser: 1 mg/dL (ref 0.61–1.24)
Glucose, Bld: 129 mg/dL — ABNORMAL HIGH (ref 65–99)
Potassium: 3.4 mmol/L — ABNORMAL LOW (ref 3.5–5.1)
SODIUM: 140 mmol/L (ref 135–145)

## 2015-04-27 LAB — GLUCOSE, CAPILLARY
GLUCOSE-CAPILLARY: 119 mg/dL — AB (ref 65–99)
GLUCOSE-CAPILLARY: 120 mg/dL — AB (ref 65–99)
GLUCOSE-CAPILLARY: 131 mg/dL — AB (ref 65–99)
Glucose-Capillary: 131 mg/dL — ABNORMAL HIGH (ref 65–99)
Glucose-Capillary: 182 mg/dL — ABNORMAL HIGH (ref 65–99)

## 2015-04-27 LAB — LACTIC ACID, PLASMA: Lactic Acid, Venous: 1.6 mmol/L (ref 0.5–2.0)

## 2015-04-27 LAB — PROCALCITONIN: Procalcitonin: 79.6 ng/mL

## 2015-04-27 LAB — MAGNESIUM: Magnesium: 2.2 mg/dL (ref 1.7–2.4)

## 2015-04-27 MED ORDER — METRONIDAZOLE IN NACL 5-0.79 MG/ML-% IV SOLN
500.0000 mg | Freq: Three times a day (TID) | INTRAVENOUS | Status: DC
  Administered 2015-04-27 – 2015-04-29 (×5): 500 mg via INTRAVENOUS
  Filled 2015-04-27 (×6): qty 100

## 2015-04-27 MED ORDER — POTASSIUM CHLORIDE IN NACL 20-0.9 MEQ/L-% IV SOLN
INTRAVENOUS | Status: DC
  Administered 2015-04-27: 12:00:00 via INTRAVENOUS
  Administered 2015-04-27: 100 mL/h via INTRAVENOUS
  Administered 2015-04-28 – 2015-04-29 (×2): via INTRAVENOUS
  Administered 2015-04-29: 100 mL/h via INTRAVENOUS
  Administered 2015-04-30 – 2015-05-03 (×6): via INTRAVENOUS
  Filled 2015-04-27 (×20): qty 1000

## 2015-04-27 MED ORDER — POTASSIUM CHLORIDE 10 MEQ/100ML IV SOLN
10.0000 meq | INTRAVENOUS | Status: AC
  Administered 2015-04-27 (×2): 10 meq via INTRAVENOUS
  Filled 2015-04-27 (×2): qty 100

## 2015-04-27 MED ORDER — SODIUM CHLORIDE 0.9 % IV BOLUS (SEPSIS)
1000.0000 mL | Freq: Once | INTRAVENOUS | Status: AC
  Administered 2015-04-27: 1000 mL via INTRAVENOUS

## 2015-04-27 MED ORDER — LORAZEPAM 2 MG/ML IJ SOLN: 1.0000 mg | Freq: Four times a day (QID) | INTRAMUSCULAR | Status: DC | PRN

## 2015-04-27 MED ORDER — VITAMIN B-1 100 MG PO TABS
100.0000 mg | ORAL_TABLET | Freq: Every day | ORAL | Status: DC
  Administered 2015-04-27 – 2015-05-03 (×7): 100 mg via ORAL
  Filled 2015-04-27 (×7): qty 1

## 2015-04-27 MED ORDER — TRAMADOL HCL 50 MG PO TABS
50.0000 mg | ORAL_TABLET | Freq: Four times a day (QID) | ORAL | Status: DC | PRN
  Administered 2015-05-03: 50 mg via ORAL
  Filled 2015-04-27: qty 1

## 2015-04-27 MED ORDER — LORAZEPAM 2 MG/ML IJ SOLN: 0.0000 mg | Freq: Four times a day (QID) | INTRAMUSCULAR | Status: DC

## 2015-04-27 MED ORDER — LORAZEPAM 2 MG/ML IJ SOLN: 0.0000 mg | Freq: Two times a day (BID) | INTRAMUSCULAR | Status: DC

## 2015-04-27 MED ORDER — LEVOFLOXACIN IN D5W 500 MG/100ML IV SOLN
500.0000 mg | INTRAVENOUS | Status: DC
  Administered 2015-04-27 – 2015-04-28 (×2): 500 mg via INTRAVENOUS
  Filled 2015-04-27 (×3): qty 100

## 2015-04-27 MED ORDER — ACETAMINOPHEN 325 MG PO TABS
650.0000 mg | ORAL_TABLET | Freq: Four times a day (QID) | ORAL | Status: DC | PRN
  Administered 2015-04-27 – 2015-05-03 (×2): 650 mg via ORAL
  Filled 2015-04-27: qty 2

## 2015-04-27 MED ORDER — LORAZEPAM 2 MG/ML IJ SOLN
1.0000 mg | Freq: Once | INTRAMUSCULAR | Status: AC
  Administered 2015-04-27: 1 mg via INTRAVENOUS
  Filled 2015-04-27: qty 1

## 2015-04-27 MED ORDER — THIAMINE HCL 100 MG/ML IJ SOLN
100.0000 mg | Freq: Every day | INTRAMUSCULAR | Status: DC
  Filled 2015-04-27 (×2): qty 2

## 2015-04-27 MED ORDER — LORAZEPAM 1 MG PO TABS: 1.0000 mg | ORAL_TABLET | Freq: Four times a day (QID) | ORAL | Status: DC | PRN

## 2015-04-27 NOTE — Progress Notes (Signed)
[facility] progress note. Chief complaint. Follow-up hypotension. History of present illness. This 81 year old male in hospital with small bowel obstruction secondary to small bowel mass. Patient is status post resection with high output ileostomy noted. Earlier today the patient became tachycardic and hypotensive. Patient was administered a total of 3 L of normal saline IV bolus. Patient has now finished the third liter of fluids seeing the patient at bedside to assess stability. Patient's WBC noted increased markedly to 33.1 latest 30.3. Lactic acid normal at the suspicion is for early sepsis possibly C. difficile. Patient was initiated on Flagyl and Levaquin anabiotic therapy empirically. I find the patient alert and in no distress. He has no complaints of dyspnea. Nursing does comment that ileostomy continues to produce fairly copious fluids and stool. Physical exam. Vital signs. Temperature 98.4, pulse 86, respiration 23, blood pressure 90/60. General appearance. Well-developed elderly male who is alert and in no distress. He appears to be mentating appropriately. Cardiac. Rate and rhythm regular. No jugular venous distention or edema. Turgor remains mildly decreased. Lungs. Course rhonchi but no crackles appreciated. Appears stable on room air. Abdomen. Soft with hypotonic bowel sounds. Impression/plan. Problem #1. Hypotension. Possibly due to early sepsis suspicion for possible C. difficile. Patient on enteric precautions and initiated on Flagyl and Levaquin and aquatic therapy. Status post third bolus the patient's blood pressure now appears more stable in the 90s. I've asked the nurse to monitor closely and notify me if the blood pressure drops down into the 80s and I believe the patient would tolerate an additional liter of fluids. Nursing will follow closely and keep me updated as needed.

## 2015-04-27 NOTE — Progress Notes (Signed)
[facility] Progress Note  7 Days Post-Op  Subjective: Pt doing okay.  He was shaking so they gave him ativan and now he's very sedated.  Tolerated sips yesterday.  NG tube had minimal output on residual checks.  No N/V.  Ileostomy with high output and flatus (5L yesterday).    Objective: Vital signs in last 24 hours: Temp:  [97.4 F (36.3 C)-99.8 F (37.7 C)] 99.8 F (37.7 C) (08/12 0740) Pulse Rate:  [80-89] 86 (08/12 0410) Resp:  [18-32] 32 (08/12 0900) BP: (105-145)/(43-89) 121/89 mmHg (08/12 0900) SpO2:  [93 %-96 %] 93 % (08/12 0410) Last BM Date: 04/24/15  Intake/Output from previous day: 08/11 0701 - 08/12 0700 In: 1080 [P.O.:1080] Out: 6100 [Urine:550; Emesis/NG output:475; Stool:5075] Intake/Output this shift:    PE: Gen: Alert, NAD, pleasant Card: RRR, no M/G/R heard Pulm: CTA, no W/R/R, great effort Abd: Soft, minimally tender, mild distension, +BS, no HSM, incisions C/D/I with staples in place, ostomy patent and pink with green clear liquid drainage and flatus in ileostomy bag.   Lab Results:   Recent Labs  04/25/15 0011 04/26/15 0425  WBC 6.3 6.5  HGB 10.4* 10.0*  HCT 32.5* 31.3*  PLT 232 252   BMET  Recent Labs  04/26/15 0425 04/27/15 0516  NA 140 140  K 3.1* 3.4*  CL 106 105  CO2 25 28  GLUCOSE 145* 129*  BUN 14 8  CREATININE 1.11 1.00  CALCIUM 8.3* 8.6*   PT/INR No results for input(s): LABPROT, INR in the last 72 hours. CMP     Component Value Date/Time   NA 140 04/27/2015 0516   K 3.4* 04/27/2015 0516   CL 105 04/27/2015 0516   CO2 28 04/27/2015 0516   GLUCOSE 129* 04/27/2015 0516   BUN 8 04/27/2015 0516   CREATININE 1.00 04/27/2015 0516   CALCIUM 8.6* 04/27/2015 0516   PROT 4.7* 04/23/2015 0404   ALBUMIN 2.2* 04/23/2015 0404   AST 31 04/23/2015 0404   ALT 9* 04/23/2015 0404   ALKPHOS 48 04/23/2015 0404   BILITOT 0.9 04/23/2015 0404   GFRNONAA >60 04/27/2015 0516   GFRAA >60 04/27/2015 0516   Lipase    Component Value Date/Time   LIPASE 17* 04/19/2015 1904       Studies/Results: No results found.  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 The patient prefers to talk to them sooner than later. -Pulm toilet and mobilize -WOC following -Post-op ileus improved, D/c NG and start clears -Staples to come out on POD #10 -Office to arrange f/u appt -Avoid narcotics and benzos as this may make the patient more confused.  Minimal pain so tylenol or ultram for break through. -Would recommend starting metamucil and maybe imodium tomorrow to help slow down the output if he tolerates clears well today since high ileostomy output.  May need tincture of opium and/sandostatin as well.   Jehovah's Witness Alcohol use - d/c CIWA, cant be going through withdrawals from alcohol this far out DVT Proph - SCD's and lovenox     LOS: 7 days    Nat Christen 04/27/2015, 10:41 AM Pager: [phone]

## 2015-04-27 NOTE — Progress Notes (Signed)
[facility] PROGRESS NOTE  Harold Price JSE:[id number] DOB: Jul 04, 1930 DOA: 04/19/2015 PCP: No PCP Per Patient HPI/Subjective: [name] PMHx Depression,HTN, ETOH Abuse, PUD, Jehovah's witness,   Presents with nausea, vomiting, abdominal distention and abdominal pain.  Patient reports that his symptoms started 2 days ago. He vomited 6-7 times a day today. His abdominal pain is located in the periumbilical area, constant, 5 out of 10 in severity, nonradiating. It is not aggravated or alleviated by any known factors. He does not have symptoms of UTI, no fever, chills, chest pain, shortness breath, diarrhea. No unilateral weakness, leg edema or rashes.  In ED, patient was found to have WBC 12.9, temperature normal, no tachycardia, electrolytes okay, is 19, negative urinalysis. CT abdomen/pelvis showed high-grade small bowel obstruction caused by a mass of the distal ileum or ileocecal valve. Multiple low-attenuation foci in the liver, more likely benign cysts but the smaller ones cannot be conclusively characterized.   Assessment/Plan: Confusion: Was called by nursing staff, patient was reportedly tremulous earlier. Now confused. Patient reportedly felt like he was withdrawing from alcohol. Improved with IV Ativan but still confused.  Will resume Ativan and CIWA protocol.  Small bowel obstruction secondary to small bowel mass: -Pathology: ULCERATED INVASIVE ADENOCARCINOMA, SEE COMMENT. - POSITIVE FOR LYMPH VASCULAR INVASION Still on clear liquids. -morphine prn pain  -oncology consult- Dr Mckinley Jewel to arrange follow up in GI oncology clinic On sips of clears.  NG clamped. Ileostomy with high output. We will increase IV fluids. Will likely help with tachycardia  Alcohol abuse: -Did counseling about the importance of quitting drinking No evidence of withdrawal -Echocardiogram done EF preserved  Hx of PUD and duodenal ulcer with hemorrhage: -Continue IV protonix 40 mg  daily  Hypetension/hypotension: None furhter  A-Fib with RVR Continues. Continue Cardizem drip. Likely worsened by dehydration and an element of alcohol withdrawal.  Not a candidate for anticoagulation at this time.  LVH -See A. fib with RVR  Hypokalemia Replete IV  Hypophosphatemia  corrected  Depression:  -Not taking med at home. Stable, no suicidal or homicidal ideations. -Observe closely  Code Status: Full Family Communication: None  Disposition Plan: Not stable for discharge   Consultants:  Dr.Matthew Donne Hazel ([facility])    Procedures: #1 subtotal colectomy, including terminal ileum #2 end ileostomy 8/7 echocardiogram;- Left ventricle: moderate concentric hypertrophy. -LVEF= 55% to 60%.  -(grade 1diastolic dysfunction) - Atrial septum: A patent foramen ovale cannot be excluded.   Cultures   Antibiotics: NA   DVT prophylaxis SCD  S: Unable. Per nursing staff, was complaining of tremulousness. Now confused.  Objective: Filed Vitals:   04/27/15 0740 04/27/15 0854 04/27/15 0900 04/27/15 1104  BP:  145/49 121/89   Pulse:      Temp: 99.8 F (37.7 C)   100.3 F (37.9 C)  TempSrc: Oral   Oral  Resp:  27 32   Height:      Weight:      SpO2:        Intake/Output Summary (Last 24 hours) at 04/27/15 1133 Last data filed at 04/27/15 1043  Gross per 24 hour  Intake   1080 ml  Output   5650 ml  Net  -4570 ml   Filed Weights   04/20/15 0445 04/26/15 0751  Weight: 99.2 kg (218 lb 11.1 oz) 96.072 kg (211 lb 12.8 oz)     Exam: General: Asleep. Arousable. Confused. Lungs: Clear to auscultation bilaterally without wheezes or crackles Cardiovascular: Regular rate and rhythm without murmur gallop or  rub normal S1 and S2 Abdomen:  Incision ok. Ileostomy bag full. Bowel sounds present. Soft, nontender. Extremities: No significant cyanosis, clubbing, or edema bilateral lower extremities    Data Reviewed: Basic Metabolic Panel:  Recent Labs Lab  04/22/15 1202 04/23/15 0404 04/23/15 1336 04/25/15 0011 04/26/15 0425 04/27/15 0516  NA 141 139  --  137 140 140  K 4.2 3.6 3.6 3.7 3.1* 3.4*  CL 110 109  --  106 106 105  CO2 24 24  --  19* 25 28  GLUCOSE 85 95  --  95 145* 129*  BUN 15 14  --  18 14 8   CREATININE 1.18 1.07  --  1.26* 1.11 1.00  CALCIUM 8.2* 7.8*  --  8.3* 8.3* 8.6*  MG  --  2.5* 2.6*  --   --  2.2  PHOS  --  1.7*  --   --   --   --    Liver Function Tests:  Recent Labs Lab 04/21/15 0317 04/23/15 0404  AST 21 31  ALT 8* 9*  ALKPHOS 37* 48  BILITOT 0.9 0.9  PROT 4.6* 4.7*  ALBUMIN 2.6* 2.2*   No results for input(s): LIPASE, AMYLASE in the last 168 hours. No results for input(s): AMMONIA in the last 168 hours. CBC:  Recent Labs Lab 04/21/15 0317 04/23/15 0404 04/25/15 0011 04/26/15 0425  WBC 13.8* 4.0 6.3 6.5  NEUTROABS  --  2.7  --   --   HGB 11.4* 9.1* 10.4* 10.0*  HCT 36.3* 29.6* 32.5* 31.3*  MCV 79.3 78.3 77.4* 77.9*  PLT 158 176 232 252   Cardiac Enzymes: No results for input(s): CKTOTAL, CKMB, CKMBINDEX, TROPONINI in the last 168 hours. BNP (last 3 results) No results for input(s): BNP in the last 8760 hours.  ProBNP (last 3 results) No results for input(s): PROBNP in the last 8760 hours.  CBG:  Recent Labs Lab 04/26/15 2159 04/27/15 0017 04/27/15 0405 04/27/15 0733 04/27/15 1101  GLUCAP 138* 182* 119* 120* 131*    No results found for this or any previous visit (from the past 240 hour(s)).   Studies: No results found.  Scheduled Meds: . antiseptic oral rinse  7 mL Mouth Rinse q12n4p  . chlorhexidine  15 mL Mouth/Throat BID  . enoxaparin (LOVENOX) injection  40 mg Subcutaneous Q24H  . folic acid  1 mg Intravenous Daily  . LORazepam  0-4 mg Intravenous Q6H   Followed by  . [START ON 04/29/2015] LORazepam  0-4 mg Intravenous Q12H  . multivitamin with minerals  1 tablet Oral Daily  . ondansetron (ZOFRAN) IV  4 mg Intravenous 4 times per day  . pantoprazole  (PROTONIX) IV  40 mg Intravenous Q24H  . sodium chloride  3 mL Intravenous Q12H  . thiamine  100 mg Oral Daily   Or  . thiamine  100 mg Intravenous Daily   Continuous Infusions: . 0.9 % NaCl with KCl 20 mEq / L    . diltiazem (CARDIZEM) infusion 15 mg/hr (04/27/15 0843)  . lactated ringers 10 mL/hr at 04/20/15 1003    Principal Problem:   Small bowel obstruction Active Problems:   Alcohol abuse   Refusal of blood transfusions as patient is Jehovah's Witness   Duodenal ulcer with hemorrhage   Hypertension   PUD (peptic ulcer disease)   Depression   Small bowel mass   Leukocytosis   Cancer of abdominal organ   Hypokalemia   LVH (left ventricular hypertrophy)   Hypophosphatemia  Atrial fibrillation with RVR    Time spent: 25 minutes   [facility]  [email or web address], password [facility] 04/27/2015, 11:33 AM  LOS: 7 days

## 2015-04-27 NOTE — Progress Notes (Addendum)
Late entry: Assumed care @ 0700 from off going RN; @0745  patient has c/o's of the shakes; Ativan given per order; Lopressor given for increased HR; patient alert & oriented times three;   tylenol given for temp 100.3 F oral  @1340  PT working w/ patient; up to chair; BP dropped ; Cardizem stopped per MD order; NS 1L given; monitor BP; @1536  BP 82/40 manual; patient alert & oriented times 3;  MD notified new order to give another liter of NS; MD will round to see patien      @1825  spoke with MD; monitor patient ; need UA ; blood cultures orders placed; and other new orders placed; patient sleeping but easily to arouse; no fever at this time; will give report to on coming RN @ 1900  @1853  patient place in [place] position; manual BP 78/58 left arm; patient alert & oriented; 3 liter infusion; lab in room; spoke w/ MD , orders are in system; night NP will round on patient; charge RN aware

## 2015-04-27 NOTE — Progress Notes (Signed)
Blood pressure low. Bolus fluid check CBC, UA, lactate, c diff. Suspect secondary to high output from ileostomy.  Doree Barthel, MD

## 2015-04-27 NOTE — Evaluation (Signed)
Physical Therapy Evaluation Patient Details Name: Harold Price MRN: [id number] DOB: April 18, 1930 Today's Date: 04/27/2015   History of Present Illness  Patient reports that his symptoms started 2 days ago. He vomited 6-7 times a day today. His abdominal pain is located in the periumbilical area, constant, 5 out of 10 in severity, nonradiating. It is not aggravated or alleviated by any known factors. He does not have symptoms of UTI, no fever, chills, chest pain, shortness breath, diarrhea. No unilateral weakness, leg edema or rashes.  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   Clinical Impression  Pt presents with the following and decreased mobility, decreased balance, and now with low BP.  PT eval limited due to low BP.  Note BP was 93/41 prior to sitting at EOB, once at EOB, BP was 107/70, therefore proceeded to get to chair with RW at mod A level and heavy cues for safety and sequence.  Once in recliner, RN to room and notified of BP during session.  BP checked again, and was 82/40, dropped to 88/30, reclined, performed ankle pumps and assisted with arm movements in order to elevate BP while RN getting fluids and changing bed linens.  Assisted back to bed in Trendelenburg position and left with nursing.  Based on movement during session, feel that SNF level of care likely most appropriate at this time unless progresses during stay.      Follow Up Recommendations SNF;Supervision/Assistance - 24 hour    Equipment Recommendations  None recommended by PT    Recommendations for Other Services       Precautions / Restrictions Precautions Precautions: Fall Precaution Comments: monitor O2, was syncopal during PT eval  Restrictions Weight Bearing Restrictions: No      Mobility  Bed Mobility Overal bed mobility: Needs Assistance Bed Mobility: Supine to Sit     Supine to sit: Supervision     General bed mobility comments: Pt able to get to EOB at S level with HOB slightly elevated and bed  rails.  Increased time and S for safety.    Transfers Overall transfer level: Needs assistance Equipment used: Rolling walker (2 wheeled) Transfers: Sit to/from [facility] Sit to Stand: Mod assist Stand pivot transfers: Mod assist       General transfer comment: Pt requires mod A to elevate from bed and lower recliner with heavy cues for hand placement and facilitation for forward weight shift.    Ambulation/Gait Ambulation/Gait assistance:  (did not assess due to safety and BP)              Stairs            Wheelchair Mobility    Modified Rankin (Stroke Patients Only)       Balance Overall balance assessment: Needs assistance Sitting-balance support: Feet supported;Bilateral upper extremity supported Sitting balance-Leahy Scale: Poor Sitting balance - Comments: Pt with intermittent LOB due to wanting to lie back down min A to get back to upright position.    Standing balance support: During functional activity;Bilateral upper extremity supported Standing balance-Leahy Scale: Poor                               Pertinent Vitals/Pain Pain Assessment: No/denies pain    Home Living Family/patient expects to be discharged to:: Private residence Living Arrangements: Spouse/significant other;Children Available Help at Discharge: Family;Available 24 hours/day Type of Home: House Home Access: Ramped entrance  Home Layout: One level Home Equipment: Walker - 2 wheels;Shower seat;Grab bars - toilet;Grab bars - tub/shower      Prior Function Level of Independence: Independent               Hand Dominance   Dominant Hand: Right    Extremity/Trunk Assessment   Upper Extremity Assessment: Generalized weakness           Lower Extremity Assessment: Generalized weakness      Cervical / Trunk Assessment: Kyphotic  Communication   Communication: No difficulties  Cognition Arousal/Alertness: Lethargic Behavior  During Therapy: WFL for tasks assessed/performed Overall Cognitive Status: Within Functional Limits for tasks assessed                      General Comments      Exercises        Assessment/Plan    PT Assessment Patient needs continued PT services  PT Diagnosis Difficulty walking;Generalized weakness   PT Problem List Decreased strength;Decreased activity tolerance;Decreased balance;Decreased mobility;Decreased safety awareness;Decreased knowledge of use of DME;Decreased knowledge of precautions;Cardiopulmonary status limiting activity  PT Treatment Interventions DME instruction;Gait training;Functional mobility training;Therapeutic activities;Therapeutic exercise;Balance training;Neuromuscular re-education;Patient/family education;Modalities   PT Goals (Current goals can be found in the Care Plan section) Acute Rehab PT Goals Patient Stated Goal: none stated PT Goal Formulation: With patient Time For Goal Achievement: 05/11/15 Potential to Achieve Goals: Good    Frequency Min 3X/week   Barriers to discharge Decreased caregiver support unsure if family able to provide hands on assist    Co-evaluation               End of Session Equipment Utilized During Treatment: Gait belt Activity Tolerance: Treatment limited secondary to medical complications (Comment) Patient left: in bed;with call bell/phone within reach;with nursing/sitter in room Nurse Communication: Mobility status;Precautions;Other (comment) (BP)         Time: 6213-0865 PT Time Calculation (min) (ACUTE ONLY): 29 min   Charges:   PT Evaluation $Initial PT Evaluation Tier I: 1 Procedure PT Treatments $Therapeutic Activity: 8-22 mins   PT G Codes:        Denice Bors 04/27/2015, 2:36 PM

## 2015-04-27 NOTE — Progress Notes (Signed)
No change at this time in d/c plan. Patient will d/c to [facility] care when medically stable per MD.  CSW services will continue to monitor and assist with d/c when indicated.  Lorie Phenix. Pauline Good, [place]

## 2015-04-27 NOTE — Consult Note (Signed)
WOC ostomy follow up Stoma type/location: RLQ, end ileostomy  Stomal assessment/size: 1 5/8" round, budded, above skin level Peristomal assessment: intact skin surrounding stoma Output high volume green liquid stool  Ostomy pouching: 1pc with barrier ring .Education provided: Current pouch was leaking behind wafer.  Applied one piece pouch with barrier ring to maintain seal.  Pt does not participate and no family members present in room.  Will need total assistance with pouching activities after discharge.  Extra supplies at bedside for staff nurses.  Enrolled patient in [place] Start Discharge program: Yes Harold Girt MSN, RN, [place], [place], [place]

## 2015-04-27 NOTE — Progress Notes (Signed)
Pt started having tremors, RN notified MD. Got order for IV ativan. Will continue to monitor. Etta Quill, RN

## 2015-04-27 NOTE — Progress Notes (Addendum)
bp remains borderline. HR better.  Will give 3rd liter saline.  Lactate normal.  WBC 33K, concerned about early sepsis.  Will get BC, cxr, await ua. c diff pending. Start empiric flagyl and levaquin pending workup.  Doree Barthel, MD

## 2015-04-27 NOTE — Progress Notes (Signed)
OT Cancellation Note  Patient Details Name: Harold Price MRN: [id number] DOB: 1930-07-09   Cancelled Treatment:    Reason Eval/Treat Not Completed: Medical issues which prohibited therapy - Pt hypotensive.   Darlina Rumpf [place], OTR/L [phone]  04/27/2015, 2:43 PM

## 2015-04-28 ENCOUNTER — Encounter (HOSPITAL_COMMUNITY): Payer: Self-pay | Admitting: Radiology

## 2015-04-28 ENCOUNTER — Inpatient Hospital Stay (HOSPITAL_COMMUNITY): Payer: Medicare Other

## 2015-04-28 LAB — COMPREHENSIVE METABOLIC PANEL
ALBUMIN: 1.9 g/dL — AB (ref 3.5–5.0)
ALT: 9 U/L — ABNORMAL LOW (ref 17–63)
ANION GAP: 5 (ref 5–15)
AST: 21 U/L (ref 15–41)
Alkaline Phosphatase: 82 U/L (ref 38–126)
BUN: 12 mg/dL (ref 6–20)
CALCIUM: 7.8 mg/dL — AB (ref 8.9–10.3)
CHLORIDE: 109 mmol/L (ref 101–111)
CO2: 24 mmol/L (ref 22–32)
Creatinine, Ser: 1.39 mg/dL — ABNORMAL HIGH (ref 0.61–1.24)
GFR calc Af Amer: 53 mL/min — ABNORMAL LOW (ref >60)
GFR calc non Af Amer: 46 mL/min — ABNORMAL LOW (ref >60)
Glucose, Bld: 95 mg/dL (ref 65–99)
Potassium: 3.5 mmol/L (ref 3.5–5.1)
Sodium: 138 mmol/L (ref 135–145)
Total Bilirubin: 0.4 mg/dL (ref 0.3–1.2)
Total Protein: 4.8 g/dL — ABNORMAL LOW (ref 6.5–8.1)

## 2015-04-28 LAB — URINALYSIS, ROUTINE W REFLEX MICROSCOPIC
Glucose, UA: NEGATIVE mg/dL
KETONES UR: 15 mg/dL — AB
Nitrite: POSITIVE — AB
PROTEIN: 30 mg/dL — AB
Specific Gravity, Urine: 1.023 (ref 1.005–1.030)
Urobilinogen, UA: 0.2 mg/dL (ref 0.0–1.0)
pH: 6 (ref 5.0–8.0)

## 2015-04-28 LAB — URINE MICROSCOPIC-ADD ON

## 2015-04-28 LAB — CBC WITH DIFFERENTIAL/PLATELET
Basophils Absolute: 0 10*3/uL (ref 0.0–0.1)
Basophils Relative: 0 % (ref 0–1)
EOS ABS: 0.2 10*3/uL (ref 0.0–0.7)
Eosinophils Relative: 1 % (ref 0–5)
HCT: 26.6 % — ABNORMAL LOW (ref 39.0–52.0)
Hemoglobin: 8.2 g/dL — ABNORMAL LOW (ref 13.0–17.0)
LYMPHS PCT: 6 % — AB (ref 12–46)
Lymphs Abs: 1.2 10*3/uL (ref 0.7–4.0)
MCH: 23.8 pg — AB (ref 26.0–34.0)
MCHC: 30.8 g/dL (ref 30.0–36.0)
MCV: 77.3 fL — AB (ref 78.0–100.0)
MONOS PCT: 5 % (ref 3–12)
Monocytes Absolute: 1 10*3/uL (ref 0.1–1.0)
NEUTROS ABS: 18 10*3/uL — AB (ref 1.7–7.7)
Neutrophils Relative %: 88 % — ABNORMAL HIGH (ref 43–77)
PLATELETS: 251 10*3/uL (ref 150–400)
RBC: 3.44 MIL/uL — AB (ref 4.22–5.81)
RDW: 16.6 % — AB (ref 11.5–15.5)
WBC: 20.4 10*3/uL — AB (ref 4.0–10.5)

## 2015-04-28 LAB — GLUCOSE, CAPILLARY
GLUCOSE-CAPILLARY: 114 mg/dL — AB (ref 65–99)
GLUCOSE-CAPILLARY: 118 mg/dL — AB (ref 65–99)
Glucose-Capillary: 140 mg/dL — ABNORMAL HIGH (ref 65–99)
Glucose-Capillary: 122 mg/dL — ABNORMAL HIGH (ref 65–99)
Glucose-Capillary: 113 mg/dL — ABNORMAL HIGH (ref 65–99)

## 2015-04-28 LAB — C DIFFICILE QUICK SCREEN W PCR REFLEX
C DIFFICILE (CDIFF) INTERP: NEGATIVE
C DIFFICILE (CDIFF) TOXIN: NEGATIVE
C DIFFICLE (CDIFF) ANTIGEN: NEGATIVE

## 2015-04-28 MED ORDER — LOPERAMIDE HCL 2 MG PO CAPS
2.0000 mg | ORAL_CAPSULE | Freq: Once | ORAL | Status: AC
  Administered 2015-04-28: 2 mg via ORAL
  Filled 2015-04-28: qty 1

## 2015-04-28 MED ORDER — IOHEXOL 300 MG/ML  SOLN
100.0000 mL | Freq: Once | INTRAMUSCULAR | Status: AC | PRN
  Administered 2015-04-28: 100 mL via INTRAVENOUS

## 2015-04-28 MED ORDER — IOHEXOL 300 MG/ML  SOLN
25.0000 mL | INTRAMUSCULAR | Status: AC
  Administered 2015-04-28 (×2): 25 mL via ORAL

## 2015-04-28 MED ORDER — SODIUM CHLORIDE 0.9 % IV BOLUS (SEPSIS)
1000.0000 mL | Freq: Once | INTRAVENOUS | Status: AC
  Administered 2015-04-28: 1000 mL via INTRAVENOUS

## 2015-04-28 NOTE — Progress Notes (Signed)
[facility] PROGRESS NOTE  Harold Price TZG:[id number] DOB: 10-28-1929 DOA: 04/19/2015 PCP: No PCP Per Patient HPI/Subjective: [name] PMHx Depression,HTN, ETOH Abuse, PUD, Jehovah's witness,   Presents with nausea, vomiting, abdominal distention and abdominal pain.  Patient reports that his symptoms started 2 days ago. He vomited 6-7 times a day today. His abdominal pain is located in the periumbilical area, constant, 5 out of 10 in severity, nonradiating. It is not aggravated or alleviated by any known factors. He does not have symptoms of UTI, no fever, chills, chest pain, shortness breath, diarrhea. No unilateral weakness, leg edema or rashes.  In ED, patient was found to have WBC 12.9, temperature normal, no tachycardia, electrolytes okay, is 19, negative urinalysis. CT abdomen/pelvis showed high-grade small bowel obstruction caused by a mass of the distal ileum or ileocecal valve. Multiple low-attenuation foci in the liver, more likely benign cysts but the smaller ones cannot be conclusively characterized.   Assessment/Plan: Shock secondary to high ileostomy output and likely sepsis: Yesterday, patient had refractory hypotension, required 3 L of saline. Eventually, blood pressure stabilized. Sudden increase in white blood cell count 33,000 with greater than 20% bands as well as pro calcitonin of 70 indicative of sepsis. Was started on Flagyl and levofloxacin empirically. Discussed with Dr. Brantley Stage. Agree with CT of the abdomen and pelvis. Patient does have a benign abdominal exam. Urinalysis with few white cells but does have leukocyte esterase and nitrates. Chest x-ray negative. Patient is subtotal colectomy, but patient can still have a clinically significant C. difficile infection as described in literature. C. difficile fortunately, is negative. As patient has improved on levofloxacin and Flagyl, will continue both empirically for now. Blood cultures are pending. Much more alert and  blood pressure is stabilized today with a slight decrease in leukocytosis.  Agree with Imodium and continued fluid resuscitation.  Confusion: Secondary to above. Improved today. In retrospect, episode of tremulousness was likely related to onset of sepsis yesterday morning, less likely alcohol withdrawal.   Small bowel obstruction secondary to small bowel mass: -Pathology: ULCERATED INVASIVE ADENOCARCINOMA, SEE COMMENT. - POSITIVE FOR LYMPH VASCULAR INVASION Still on clear liquids. -morphine prn pain  -oncology consult- Dr Mckinley Jewel to arrange follow up in GI oncology clinic Diet advance to full liquids.  Alcohol abuse: -Did counseling about the importance of quitting drinking No evidence of withdrawal currently -Echocardiogram done EF preserved  Hx of PUD and duodenal ulcer with hemorrhage: -Continue IV protonix 40 mg daily  A-Fib with RVR Better controlled today.  Not a candidate for anticoagulation at this time due to recent surgery, anemia, deconditioning with fall risk, alcohol abuse and refusal of blood products should bleeding occur on anticoagulation. chadsvasc 2  LVH -See A. fib with RVR  Hypokalemia improving  Hypophosphatemia  corrected   Code Status: Full Family Communication: None  Disposition Plan: Not stable for discharge   Consultants:  Dr.Matthew Donne Hazel ([facility])    Procedures: #1 subtotal colectomy, including terminal ileum #2 end ileostomy 8/7 echocardiogram;- Left ventricle: moderate concentric hypertrophy. -LVEF= 55% to 60%.  -(grade 1diastolic dysfunction) - Atrial septum: A patent foramen ovale cannot be excluded.   Cultures   Antibiotics: NA   DVT prophylaxis SCD  S: Feels better today. Some cough. No shortness of breath. No dysuria. Feels bloated but no specific abdominal complaints.  Objective: Filed Vitals:   04/28/15 0045 04/28/15 0230 04/28/15 0444 04/28/15 0536  BP: 105/55 95/49 93/48  105/46  Pulse: 82  73 72  Temp:  97.4 F (  36.3 C)  98.7 F (37.1 C)   TempSrc: Oral  Oral   Resp: 23 15 17 20   Height:      Weight:   98.9 kg (218 lb 0.6 oz)   SpO2: 93% 95% 98% 97%    Intake/Output Summary (Last 24 hours) at 04/28/15 0919 Last data filed at 04/28/15 0700  Gross per 24 hour  Intake 2446.67 ml  Output   5575 ml  Net -3128.33 ml   Filed Weights   04/20/15 0445 04/26/15 0751 04/28/15 0444  Weight: 99.2 kg (218 lb 11.1 oz) 96.072 kg (211 lb 12.8 oz) 98.9 kg (218 lb 0.6 oz)     Exam: General: More alert and appropriate. No tremulousness. NG tube out. Lungs: Clear to auscultation bilaterally without wheezes or crackles Cardiovascular: Regular rate and rhythm without murmur gallop or rub normal S1 and S2 Abdomen:  Incision ok. Ileostomy bag full. Bowel sounds present. Soft, nontender. Extremities: No significant cyanosis, clubbing, or edema bilateral lower extremities   Data Reviewed: Basic Metabolic Panel:  Recent Labs Lab 04/23/15 0404 04/23/15 1336 04/25/15 0011 04/26/15 0425 04/27/15 0516 04/28/15 0518  NA 139  --  137 140 140 138  K 3.6 3.6 3.7 3.1* 3.4* 3.5  CL 109  --  106 106 105 109  CO2 24  --  19* 25 28 24   GLUCOSE 95  --  95 145* 129* 95  BUN 14  --  18 14 8 12   CREATININE 1.07  --  1.26* 1.11 1.00 1.39*  CALCIUM 7.8*  --  8.3* 8.3* 8.6* 7.8*  MG 2.5* 2.6*  --   --  2.2  --   PHOS 1.7*  --   --   --   --   --    Liver Function Tests:  Recent Labs Lab 04/23/15 0404 04/28/15 0518  AST 31 21  ALT 9* 9*  ALKPHOS 48 82  BILITOT 0.9 0.4  PROT 4.7* 4.8*  ALBUMIN 2.2* 1.9*   No results for input(s): LIPASE, AMYLASE in the last 168 hours. No results for input(s): AMMONIA in the last 168 hours. CBC:  Recent Labs Lab 04/23/15 0404 04/25/15 0011 04/26/15 0425 04/27/15 1710 04/27/15 1930 04/28/15 0518  WBC 4.0 6.3 6.5 33.1* 30.3* 20.4*  NEUTROABS 2.7  --   --  29.5* 27.3* PENDING  HGB 9.1* 10.4* 10.0* 8.6* 9.5* 8.2*  HCT 29.6* 32.5* 31.3* 27.3* 30.2* 26.6*   MCV 78.3 77.4* 77.9* 76.5* 78.6 77.3*  PLT 176 232 252 226 218 251   Cardiac Enzymes: No results for input(s): CKTOTAL, CKMB, CKMBINDEX, TROPONINI in the last 168 hours. BNP (last 3 results) No results for input(s): BNP in the last 8760 hours.  ProBNP (last 3 results) No results for input(s): PROBNP in the last 8760 hours.  CBG:  Recent Labs Lab 04/27/15 0733 04/27/15 1101 04/27/15 1621 04/27/15 2128 04/28/15 0733  GLUCAP 120* 131* 131* 140* 114*    Recent Results (from the past 240 hour(s))  C difficile quick scan w PCR reflex     Status: None   Collection Time: 04/28/15 12:55 AM  Result Value Ref Range Status   C Diff antigen NEGATIVE NEGATIVE Final   C Diff toxin NEGATIVE NEGATIVE Final   C Diff interpretation Negative for toxigenic C. difficile  Final     Studies: Dg Chest Port 1 View  04/27/2015   CLINICAL DATA:  Productive cough  EXAM: PORTABLE CHEST - 1 VIEW  COMPARISON:  11/06/2012  FINDINGS:  Cardiac shadow is stable. The lungs are well aerated bilaterally. Minimal basilar atelectasis is noted left slightly greater than right. No bony abnormality is seen. Stable degenerative change of the thoracic spine is noted.  IMPRESSION: Bibasilar atelectasis left greater than right.   Electronically Signed   By: Inez Catalina M.D.   On: 04/27/2015 20:17   Dg Abd Portable 1v  04/27/2015   CLINICAL DATA:  Ileus  EXAM: PORTABLE ABDOMEN - 1 VIEW  COMPARISON:  04/24/2015  FINDINGS: Mild gaseous distention of multiple loops small bowel. Relative paucity of colonic air. There is no evidence of pneumoperitoneum, portal venous gas or pneumatosis. There are no pathologic calcifications along the expected course of the ureters.The osseous structures are unremarkable. Midline surgical staples are noted.  IMPRESSION: Mild gaseous distention of multiple loops of small bowel with relative paucity of colonic air. This likely reflects persistent ileus.   Electronically Signed   By: Kathreen Devoid    On: 04/27/2015 12:08    Scheduled Meds: . antiseptic oral rinse  7 mL Mouth Rinse q12n4p  . chlorhexidine  15 mL Mouth/Throat BID  . enoxaparin (LOVENOX) injection  40 mg Subcutaneous Q24H  . folic acid  1 mg Intravenous Daily  . levofloxacin (LEVAQUIN) IV  500 mg Intravenous Q24H  . loperamide  2 mg Oral Once  . metronidazole  500 mg Intravenous Q8H  . multivitamin with minerals  1 tablet Oral Daily  . ondansetron (ZOFRAN) IV  4 mg Intravenous 4 times per day  . pantoprazole (PROTONIX) IV  40 mg Intravenous Q24H  . sodium chloride  1,000 mL Intravenous Once  . sodium chloride  3 mL Intravenous Q12H  . thiamine  100 mg Oral Daily   Or  . thiamine  100 mg Intravenous Daily   Continuous Infusions: . 0.9 % NaCl with KCl 20 mEq / L 100 mL/hr (04/27/15 2343)  . diltiazem (CARDIZEM) infusion Stopped (04/27/15 1410)  . lactated ringers 10 mL/hr at 04/20/15 1003    Time spent: 25 minutes   [facility]  [email or web address], password [facility] 04/28/2015, 9:19 AM  LOS: 8 days

## 2015-04-28 NOTE — Progress Notes (Addendum)
Late entry: assumed care from off going RN @ [phone]; patient sleeping at that time; patient blood cultures positive ; MD notified; no new orders; patient up in chair ; patient shows no signs of acute distress; gave report to on coming RN

## 2015-04-28 NOTE — Progress Notes (Signed)
Paged Harold Price to see if patient needs stool for c-diff sent , he was ordered to be on enteric precautions. [facility] [employer]

## 2015-04-28 NOTE — Progress Notes (Signed)
8 Days Post-Op  Subjective: Pt awake denies pain Ostomy output noted  Coughing up gunk   Objective: Vital signs in last 24 hours: Temp:  [97.4 F (36.3 C)-100.3 F (37.9 C)] 98.7 F (37.1 C) (08/13 0444) Pulse Rate:  [72-82] 72 (08/13 0536) Resp:  [15-29] 20 (08/13 0536) BP: (61-116)/(24-82) 105/46 mmHg (08/13 0536) SpO2:  [93 %-98 %] 97 % (08/13 0536) Weight:  [98.9 kg (218 lb 0.6 oz)] 98.9 kg (218 lb 0.6 oz) (08/13 0444) Last BM Date: 04/28/15  Intake/Output from previous day: 08/12 0701 - 08/13 0700 In: 2446.7 [P.O.:360; I.V.:1786.7; IV Piggyback:300] Out: 1610 [Urine:400; RUEAV:4098] Intake/Output this shift:    Resp: rhonchi bilaterally  Abdomen: incision CDI   OSTOMY PINK AND FUNCTION  Lab Results:   Recent Labs  04/27/15 1930 04/28/15 0518  WBC 30.3* 20.4*  HGB 9.5* 8.2*  HCT 30.2* 26.6*  PLT 218 251   BMET  Recent Labs  04/27/15 0516 04/28/15 0518  NA 140 138  K 3.4* 3.5  CL 105 109  CO2 28 24  GLUCOSE 129* 95  BUN 8 12  CREATININE 1.00 1.39*  CALCIUM 8.6* 7.8*   PT/INR No results for input(s): LABPROT, INR in the last 72 hours. ABG No results for input(s): PHART, HCO3 in the last 72 hours.  Invalid input(s): PCO2, PO2  Studies/Results: Dg Chest Port 1 View  04/27/2015   CLINICAL DATA:  Productive cough  EXAM: PORTABLE CHEST - 1 VIEW  COMPARISON:  11/06/2012  FINDINGS: Cardiac shadow is stable. The lungs are well aerated bilaterally. Minimal basilar atelectasis is noted left slightly greater than right. No bony abnormality is seen. Stable degenerative change of the thoracic spine is noted.  IMPRESSION: Bibasilar atelectasis left greater than right.   Electronically Signed   By: Inez Catalina M.D.   On: 04/27/2015 20:17   Dg Abd Portable 1v  04/27/2015   CLINICAL DATA:  Ileus  EXAM: PORTABLE ABDOMEN - 1 VIEW  COMPARISON:  04/24/2015  FINDINGS: Mild gaseous distention of multiple loops small bowel. Relative paucity of colonic air. There is no  evidence of pneumoperitoneum, portal venous gas or pneumatosis. There are no pathologic calcifications along the expected course of the ureters.The osseous structures are unremarkable. Midline surgical staples are noted.  IMPRESSION: Mild gaseous distention of multiple loops of small bowel with relative paucity of colonic air. This likely reflects persistent ileus.   Electronically Signed   By: Kathreen Devoid   On: 04/27/2015 12:08    Anti-infectives: Anti-infectives    Start     Dose/Rate Route Frequency Ordered Stop   04/27/15 1845  metroNIDAZOLE (FLAGYL) IVPB 500 mg     500 mg 100 mL/hr over 60 Minutes Intravenous Every 8 hours 04/27/15 1830     04/27/15 1845  levofloxacin (LEVAQUIN) IVPB 500 mg     500 mg 100 mL/hr over 60 Minutes Intravenous Every 24 hours 04/27/15 1831     04/21/15 1100  cefoTEtan (CEFOTAN) 2 g in dextrose 5 % 50 mL IVPB  Status:  Discontinued     2 g 100 mL/hr over 30 Minutes Intravenous  Once 04/20/15 1601 04/20/15 1658   04/20/15 1000  [MAR Hold]  cefoTEtan (CEFOTAN) 2 g in dextrose 5 % 50 mL IVPB     (MAR Hold since 04/20/15 0954)   2 g 100 mL/hr over 30 Minutes Intravenous To [facility] 04/20/15 0859 04/20/15 1115      Assessment/Plan: s/p Procedure(s) with comments: EXPLORATORY LAPAROTOMY WITH PARTIAL COLECTOMY (  N/A) - Exploratory lap with right colectomy, small bowel and sigmoid resection, ileostomy High output ileostomy  Add imodium and bolus 1000 cc saline  Unlikely to have clinically significant c diff since output from ileum no colon Would recommend against any further testing of ileostomy output for C  diff unless he has copious amount of material per rectum Pulmonary toilet pt has productive cough today.  On levaquin and flagyl  History  Of   A fib   STABLE   LOS: 8 days    Harold Irigoyen A. 04/28/2015

## 2015-04-29 LAB — CBC
HEMATOCRIT: 28.9 % — AB (ref 39.0–52.0)
Hemoglobin: 9.2 g/dL — ABNORMAL LOW (ref 13.0–17.0)
MCH: 24.4 pg — AB (ref 26.0–34.0)
MCHC: 31.8 g/dL (ref 30.0–36.0)
MCV: 76.7 fL — AB (ref 78.0–100.0)
PLATELETS: 227 10*3/uL (ref 150–400)
RBC: 3.77 MIL/uL — ABNORMAL LOW (ref 4.22–5.81)
RDW: 16.3 % — AB (ref 11.5–15.5)
WBC: 14.6 10*3/uL — ABNORMAL HIGH (ref 4.0–10.5)

## 2015-04-29 LAB — PROCALCITONIN: Procalcitonin: 32.12 ng/mL

## 2015-04-29 LAB — COMPREHENSIVE METABOLIC PANEL
ALBUMIN: 2 g/dL — AB (ref 3.5–5.0)
ALK PHOS: 76 U/L (ref 38–126)
ALT: 7 U/L — AB (ref 17–63)
ANION GAP: 8 (ref 5–15)
AST: 21 U/L (ref 15–41)
BILIRUBIN TOTAL: 0.4 mg/dL (ref 0.3–1.2)
BUN: 7 mg/dL (ref 6–20)
CALCIUM: 8.1 mg/dL — AB (ref 8.9–10.3)
CHLORIDE: 105 mmol/L (ref 101–111)
CO2: 22 mmol/L (ref 22–32)
Creatinine, Ser: 1.05 mg/dL (ref 0.61–1.24)
GFR calc non Af Amer: >60 mL/min (ref >60)
Glucose, Bld: 98 mg/dL (ref 65–99)
Potassium: 3.7 mmol/L (ref 3.5–5.1)
Sodium: 135 mmol/L (ref 135–145)
TOTAL PROTEIN: 5 g/dL — AB (ref 6.5–8.1)

## 2015-04-29 LAB — GLUCOSE, CAPILLARY
GLUCOSE-CAPILLARY: 133 mg/dL — AB (ref 65–99)
GLUCOSE-CAPILLARY: 124 mg/dL — AB (ref 65–99)
GLUCOSE-CAPILLARY: 104 mg/dL — AB (ref 65–99)
Glucose-Capillary: 104 mg/dL — ABNORMAL HIGH (ref 65–99)

## 2015-04-29 MED ORDER — LOPERAMIDE HCL 2 MG PO CAPS
4.0000 mg | ORAL_CAPSULE | Freq: Two times a day (BID) | ORAL | Status: DC
  Administered 2015-04-29 – 2015-04-30 (×2): 4 mg via ORAL
  Filled 2015-04-29 (×2): qty 2

## 2015-04-29 MED ORDER — FOLIC ACID 1 MG PO TABS
1.0000 mg | ORAL_TABLET | Freq: Every day | ORAL | Status: DC
Start: 2015-04-30 — End: 2015-05-03
  Administered 2015-04-30 – 2015-05-03 (×4): 1 mg via ORAL
  Filled 2015-04-29 (×4): qty 1

## 2015-04-29 MED ORDER — PANTOPRAZOLE SODIUM 40 MG PO TBEC
40.0000 mg | DELAYED_RELEASE_TABLET | Freq: Every day | ORAL | Status: DC
  Administered 2015-04-29 – 2015-05-03 (×5): 40 mg via ORAL
  Filled 2015-04-29 (×5): qty 1

## 2015-04-29 MED ORDER — LEVOFLOXACIN 500 MG PO TABS
500.0000 mg | ORAL_TABLET | Freq: Every day | ORAL | Status: DC
  Administered 2015-04-29 – 2015-05-03 (×5): 500 mg via ORAL
  Filled 2015-04-29 (×5): qty 1

## 2015-04-29 MED ORDER — METOPROLOL TARTRATE 1 MG/ML IV SOLN: 2.5000 mg | Freq: Four times a day (QID) | INTRAVENOUS | Status: DC | PRN

## 2015-04-29 NOTE — Care Management Important Message (Signed)
Important Message  Patient Details  Name: Harold Price MRN: [id number] Date of Birth: [birth year]   Medicare Important Message Given:  Yes-fourth notification given    Guido Sander, RN 04/29/2015, 2:08 PM

## 2015-04-29 NOTE — Progress Notes (Signed)
[facility] Progress Note 9 Days Post-Op  Subjective: Patient sitting up in the chair feeling better than yesterday.  Objective: Vital signs in last 24 hours: Temp:  [98.3 F (36.8 C)-99.1 F (37.3 C)] 98.3 F (36.8 C) (08/14 0735) Pulse Rate:  [60-102] 82 (08/14 0915) Resp:  [15-24] 19 (08/14 0915) BP: (96-122)/(37-55) 103/44 mmHg (08/14 0915) SpO2:  [95 %-100 %] 97 % (08/14 0915) Weight:  [95.845 kg (211 lb 4.8 oz)] 95.845 kg (211 lb 4.8 oz) (08/14 0539) Last BM Date: 04/29/15  Intake/Output from previous day: 08/13 0701 - 08/14 0700 In: 3493 [P.O.:990; I.V.:2403; IV Piggyback:100] Out: 6256 [Urine:1220; Stool:4250] Intake/Output this shift: Total I/O In: 801.7 [P.O.:480; I.V.:321.7] Out: 850 [Stool:850]  General: No acute distress.  Feels better.  Lungs: Clear  Abd: Still with >4 liters output yesterday.  Will try to increase imodium.  Voiding into urinal as I was seeing him.  Better hydrated overall.  Extremities: No clinical signs or symptoms of DVT  Neuro: Intact  Lab Results:  @LABLAST2 (wbc:2,hgb:2,hct:2,plt:2) BMET ) Recent Labs  04/28/15 0518 04/29/15 0535  NA 138 135  K 3.5 3.7  CL 109 105  CO2 24 22  GLUCOSE 95 98  BUN 12 7  CREATININE 1.39* 1.05  CALCIUM 7.8* 8.1*   PT/INR No results for input(s): LABPROT, INR in the last 72 hours. ABG No results for input(s): PHART, HCO3 in the last 72 hours.  Invalid input(s): PCO2, PO2  Studies/Results: Ct Abdomen Pelvis W Contrast  04/28/2015   CLINICAL DATA:  Leukocytosis, status post ex lap.  EXAM: CT ABDOMEN AND PELVIS WITH CONTRAST  TECHNIQUE: Multidetector CT imaging of the abdomen and pelvis was performed using the standard protocol following bolus administration of intravenous contrast.  CONTRAST:  132mL OMNIPAQUE IOHEXOL 300 MG/ML  SOLN  COMPARISON:  04/20/2015  FINDINGS: Lower chest:  Small left pleural effusion.  Bibasilar atelectasis.  Hepatobiliary: Multiple hypodense hepatic mass is unchanged  in the prior exam likely representing cysts. 12 mm hypodense lesion in the right hepatic lobe superiorly may reflect a cyst versus hemangioma unchanged compared with 04/20/2015. Distended gallbladder.  Pancreas: Pancreas is normal.  Spleen: Spleen is normal.  Adrenals/Urinary Tract: Normal adrenal glands. Normal kidneys. Small amount air in the bladder which is likely secondary to instrumentation. High density material along the dependent portion of the bladder likely reflecting excreted contrast.  Stomach/Bowel: Mild gastric distension. Interval partial colectomy. There is a sigmoid stump terminating at the mid abdomen anteriorly. There is a small amount of contrast present within the sigmoid colon and rectum. There is a right lower quadrant ileostomy. There is diffuse small bowel wall thickening with multiple small bowel air-fluid levels. There is no bowel obstruction. There is a moderate amount of pelvic free fluid. There is a small amount of fluid within the mesenteries. There is a mid midline abdominal wall incision.  Vascular/Lymphatic: Normal caliber abdominal aorta. No abdominal or pelvic lymphadenopathy. Abdominal aortic atherosclerosis.  Reproductive: Unremarkable prostate gland.  Other: No focal fluid collection or hematoma.  Musculoskeletal: No acute osseous abnormality. No lytic or sclerotic osseous lesion. Degenerative changes of bilateral sacroiliac joints with and anterior left SI joint bridging osteophyte. Anterior bridging osteophytes of the thoracolumbar spine as can be seen with diffuse idiopathic skeletal hyperostosis. Bilateral facet arthropathy at L3-4, L4-5 and L5-S1.  IMPRESSION: 1. No evidence of intra-abdominal abscess. Right lower quadrant ileostomy. Diffuse small bowel wall thickening with multiple small bowel air-fluid levels without dilatation particularly involving the distal ileum. These likely represent  postsurgical changes with possible enteritis in the appropriate clinical  setting. There is same moderate amount of abdominal free fluid. 2. Interval subtotal colectomy with a sigmoid stump terminating in the mid abdomen anteriorly adjacent to a loop of ileum. Small amount of contrast is present within the sigmoid colon and rectum. 3. Trace right and small left pleural effusions with bibasilar atelectasis. 4. Small amount air in the bladder likely secondary to instrumentation. In the absence of instrumentation infection can present in this fashion.   Electronically Signed   By: Kathreen Devoid   On: 04/28/2015 13:44   Dg Chest Port 1 View  04/27/2015   CLINICAL DATA:  Productive cough  EXAM: PORTABLE CHEST - 1 VIEW  COMPARISON:  11/06/2012  FINDINGS: Cardiac shadow is stable. The lungs are well aerated bilaterally. Minimal basilar atelectasis is noted left slightly greater than right. No bony abnormality is seen. Stable degenerative change of the thoracic spine is noted.  IMPRESSION: Bibasilar atelectasis left greater than right.   Electronically Signed   By: Inez Catalina M.D.   On: 04/27/2015 20:17   Dg Abd Portable 1v  04/27/2015   CLINICAL DATA:  Ileus  EXAM: PORTABLE ABDOMEN - 1 VIEW  COMPARISON:  04/24/2015  FINDINGS: Mild gaseous distention of multiple loops small bowel. Relative paucity of colonic air. There is no evidence of pneumoperitoneum, portal venous gas or pneumatosis. There are no pathologic calcifications along the expected course of the ureters.The osseous structures are unremarkable. Midline surgical staples are noted.  IMPRESSION: Mild gaseous distention of multiple loops of small bowel with relative paucity of colonic air. This likely reflects persistent ileus.   Electronically Signed   By: Kathreen Devoid   On: 04/27/2015 12:08    Anti-infectives: Anti-infectives    Start     Dose/Rate Route Frequency Ordered Stop   04/27/15 1845  metroNIDAZOLE (FLAGYL) IVPB 500 mg  Status:  Discontinued     500 mg 100 mL/hr over 60 Minutes Intravenous Every 8 hours 04/27/15  1830 04/29/15 0745   04/27/15 1845  levofloxacin (LEVAQUIN) IVPB 500 mg     500 mg 100 mL/hr over 60 Minutes Intravenous Every 24 hours 04/27/15 1831     04/21/15 1100  cefoTEtan (CEFOTAN) 2 g in dextrose 5 % 50 mL IVPB  Status:  Discontinued     2 g 100 mL/hr over 30 Minutes Intravenous  Once 04/20/15 1601 04/20/15 1658   04/20/15 1000  [MAR Hold]  cefoTEtan (CEFOTAN) 2 g in dextrose 5 % 50 mL IVPB     (MAR Hold since 04/20/15 0954)   2 g 100 mL/hr over 30 Minutes Intravenous To [facility] 04/20/15 0859 04/20/15 1115      Assessment/Plan: s/p Procedure(s): EXPLORATORY LAPAROTOMY WITH PARTIAL COLECTOMY Advance diet Increase imodium.  LOS: 9 days   Kathryne Eriksson. Dahlia Bailiff, MD, FACS [phone] [facility] Surgery 04/29/2015

## 2015-04-29 NOTE — Progress Notes (Signed)
[facility] PROGRESS NOTE  Juergen Hardenbrook ZES:[id number] DOB: December 26, 1929 DOA: 04/19/2015 PCP: No PCP Per Patient HPI/Subjective: [name] PMHx Depression,HTN, ETOH Abuse, PUD, Jehovah's witness,   Presents with nausea, vomiting, abdominal distention and abdominal pain.  Patient reports that his symptoms started 2 days ago. He vomited 6-7 times a day today. His abdominal pain is located in the periumbilical area, constant, 5 out of 10 in severity, nonradiating. It is not aggravated or alleviated by any known factors. He does not have symptoms of UTI, no fever, chills, chest pain, shortness breath, diarrhea. No unilateral weakness, leg edema or rashes.  In ED, patient was found to have WBC 12.9, temperature normal, no tachycardia, electrolytes okay, is 19, negative urinalysis. CT abdomen/pelvis showed high-grade small bowel obstruction caused by a mass of the distal ileum or ileocecal valve. Multiple low-attenuation foci in the liver, more likely benign cysts but the smaller ones cannot be conclusively characterized.   Assessment/Plan: Septic shock: BC growing GNR. CT abd pelvis negative for abscess. Shows enteritis.  c diff neg.  Improving on levaquin. Will check urine culture. Await cx results.  Confusion: much improved   Small bowel obstruction secondary to small bowel mass: Diet advanced. High output from ileostomy. imodium being adjusted. outpt f/u with oncology once recovered from surgery  Alcohol abuse: -Did counseling about the importance of quitting drinking No evidence of withdrawal currently -Echocardiogram done EF preserved  Hx of PUD and duodenal ulcer with hemorrhage: Change PPI to PO  A-Fib with RVR Resolved.   LVH -See A. fib with RVR  Hypokalemia improving  Hypophosphatemia  corrected   Code Status: Full Family Communication: None  Disposition Plan: Not stable for discharge   Consultants:  Dr.Matthew Donne Hazel ([facility])    Procedures: #1 subtotal  colectomy, including terminal ileum #2 end ileostomy 8/7 echocardiogram;- Left ventricle: moderate concentric hypertrophy. -LVEF= 55% to 60%.  -(grade 1diastolic dysfunction) - Atrial septum: A patent foramen ovale cannot be excluded.   Cultures   Antibiotics: NA   DVT prophylaxis SCD  S: continues to improve  Objective: Filed Vitals:   04/29/15 0745 04/29/15 0815 04/29/15 0845 04/29/15 0915  BP: 108/55 96/46 101/47 103/44  Pulse: 102 77 78 82  Temp:      TempSrc:      Resp: 15 17 22 19   Height:      Weight:      SpO2: 97% 97% 100% 97%    Intake/Output Summary (Last 24 hours) at 04/29/15 1028 Last data filed at 04/29/15 0913  Gross per 24 hour  Intake 3841.67 ml  Output   5870 ml  Net -2028.33 ml   Filed Weights   04/26/15 0751 04/28/15 0444 04/29/15 0539  Weight: 96.072 kg (211 lb 12.8 oz) 98.9 kg (218 lb 0.6 oz) 95.845 kg (211 lb 4.8 oz)     Exam: General: a and o in chair Lungs: Clear to auscultation bilaterally without wheezes or crackles Cardiovascular: Regular rate and rhythm without murmur gallop or rub normal S1 and S2 Abdomen:  Incision ok. Ileostomy with dark liquidl. Bowel sounds present. Soft, nontender. Extremities: No significant cyanosis, clubbing, or edema bilateral lower extremities   Data Reviewed: Basic Metabolic Panel:  Recent Labs Lab 04/23/15 0404 04/23/15 1336 04/25/15 0011 04/26/15 0425 04/27/15 0516 04/28/15 0518 04/29/15 0535  NA 139  --  137 140 140 138 135  K 3.6 3.6 3.7 3.1* 3.4* 3.5 3.7  CL 109  --  106 106 105 109 105  CO2 24  --  19* 25 28 24 22   GLUCOSE 95  --  95 145* 129* 95 98  BUN 14  --  18 14 8 12 7   CREATININE 1.07  --  1.26* 1.11 1.00 1.39* 1.05  CALCIUM 7.8*  --  8.3* 8.3* 8.6* 7.8* 8.1*  MG 2.5* 2.6*  --   --  2.2  --   --   PHOS 1.7*  --   --   --   --   --   --    Liver Function Tests:  Recent Labs Lab 04/23/15 0404 04/28/15 0518 04/29/15 0535  AST 31 21 21   ALT 9* 9* 7*  ALKPHOS 48 82  76  BILITOT 0.9 0.4 0.4  PROT 4.7* 4.8* 5.0*  ALBUMIN 2.2* 1.9* 2.0*   No results for input(s): LIPASE, AMYLASE in the last 168 hours. No results for input(s): AMMONIA in the last 168 hours. CBC:  Recent Labs Lab 04/23/15 0404  04/26/15 0425 04/27/15 1710 04/27/15 1930 04/28/15 0518 04/29/15 0535  WBC 4.0  < > 6.5 33.1* 30.3* 20.4* 14.6*  NEUTROABS 2.7  --   --  29.5* 27.3* 18.0*  --   HGB 9.1*  < > 10.0* 8.6* 9.5* 8.2* 9.2*  HCT 29.6*  < > 31.3* 27.3* 30.2* 26.6* 28.9*  MCV 78.3  < > 77.9* 76.5* 78.6 77.3* 76.7*  PLT 176  < > 252 226 218 251 227  < > = values in this interval not displayed. Cardiac Enzymes: No results for input(s): CKTOTAL, CKMB, CKMBINDEX, TROPONINI in the last 168 hours. BNP (last 3 results) No results for input(s): BNP in the last 8760 hours.  ProBNP (last 3 results) No results for input(s): PROBNP in the last 8760 hours.  CBG:  Recent Labs Lab 04/28/15 0733 04/28/15 1131 04/28/15 1713 04/28/15 2124 04/29/15 0732  GLUCAP 114* 122* 118* 113* 104*    Recent Results (from the past 240 hour(s))  Culture, blood (routine x 2)     Status: None (Preliminary result)   Collection Time: 04/27/15  7:08 PM  Result Value Ref Range Status   Specimen Description BLOOD RIGHT HAND  Final   Special Requests BOTTLES DRAWN AEROBIC ONLY 5CC  Final   Culture  Setup Time   Final    GRAM NEGATIVE RODS AEROBIC BOTTLE ONLY CRITICAL RESULT CALLED TO, READ BACK BY AND VERIFIED WITH: Claudell Kyle 04/28/15 @ 64 M VESTAL    Culture GRAM NEGATIVE RODS  Final   Report Status PENDING  Incomplete  Culture, blood (routine x 2)     Status: None (Preliminary result)   Collection Time: 04/27/15  7:30 PM  Result Value Ref Range Status   Specimen Description BLOOD LEFT HAND  Final   Special Requests BOTTLES DRAWN AEROBIC ONLY 5CC  Final   Culture  Setup Time   Final    GRAM NEGATIVE RODS AEROBIC BOTTLE ONLY CRITICAL RESULT CALLED TO, READ BACK BY AND VERIFIED WITH: Claudell Kyle  04/28/15 @ 61 M VESTAL    Culture GRAM NEGATIVE RODS  Final   Report Status PENDING  Incomplete  C difficile quick scan w PCR reflex     Status: None   Collection Time: 04/28/15 12:55 AM  Result Value Ref Range Status   C Diff antigen NEGATIVE NEGATIVE Final   C Diff toxin NEGATIVE NEGATIVE Final   C Diff interpretation Negative for toxigenic C. difficile  Final     Studies: Ct Abdomen Pelvis W Contrast  04/28/2015   CLINICAL  DATA:  Leukocytosis, status post ex lap.  EXAM: CT ABDOMEN AND PELVIS WITH CONTRAST  TECHNIQUE: Multidetector CT imaging of the abdomen and pelvis was performed using the standard protocol following bolus administration of intravenous contrast.  CONTRAST:  151mL OMNIPAQUE IOHEXOL 300 MG/ML  SOLN  COMPARISON:  04/20/2015  FINDINGS: Lower chest:  Small left pleural effusion.  Bibasilar atelectasis.  Hepatobiliary: Multiple hypodense hepatic mass is unchanged in the prior exam likely representing cysts. 12 mm hypodense lesion in the right hepatic lobe superiorly may reflect a cyst versus hemangioma unchanged compared with 04/20/2015. Distended gallbladder.  Pancreas: Pancreas is normal.  Spleen: Spleen is normal.  Adrenals/Urinary Tract: Normal adrenal glands. Normal kidneys. Small amount air in the bladder which is likely secondary to instrumentation. High density material along the dependent portion of the bladder likely reflecting excreted contrast.  Stomach/Bowel: Mild gastric distension. Interval partial colectomy. There is a sigmoid stump terminating at the mid abdomen anteriorly. There is a small amount of contrast present within the sigmoid colon and rectum. There is a right lower quadrant ileostomy. There is diffuse small bowel wall thickening with multiple small bowel air-fluid levels. There is no bowel obstruction. There is a moderate amount of pelvic free fluid. There is a small amount of fluid within the mesenteries. There is a mid midline abdominal wall incision.   Vascular/Lymphatic: Normal caliber abdominal aorta. No abdominal or pelvic lymphadenopathy. Abdominal aortic atherosclerosis.  Reproductive: Unremarkable prostate gland.  Other: No focal fluid collection or hematoma.  Musculoskeletal: No acute osseous abnormality. No lytic or sclerotic osseous lesion. Degenerative changes of bilateral sacroiliac joints with and anterior left SI joint bridging osteophyte. Anterior bridging osteophytes of the thoracolumbar spine as can be seen with diffuse idiopathic skeletal hyperostosis. Bilateral facet arthropathy at L3-4, L4-5 and L5-S1.  IMPRESSION: 1. No evidence of intra-abdominal abscess. Right lower quadrant ileostomy. Diffuse small bowel wall thickening with multiple small bowel air-fluid levels without dilatation particularly involving the distal ileum. These likely represent postsurgical changes with possible enteritis in the appropriate clinical setting. There is same moderate amount of abdominal free fluid. 2. Interval subtotal colectomy with a sigmoid stump terminating in the mid abdomen anteriorly adjacent to a loop of ileum. Small amount of contrast is present within the sigmoid colon and rectum. 3. Trace right and small left pleural effusions with bibasilar atelectasis. 4. Small amount air in the bladder likely secondary to instrumentation. In the absence of instrumentation infection can present in this fashion.   Electronically Signed   By: Kathreen Devoid   On: 04/28/2015 13:44   Dg Chest Port 1 View  04/27/2015   CLINICAL DATA:  Productive cough  EXAM: PORTABLE CHEST - 1 VIEW  COMPARISON:  11/06/2012  FINDINGS: Cardiac shadow is stable. The lungs are well aerated bilaterally. Minimal basilar atelectasis is noted left slightly greater than right. No bony abnormality is seen. Stable degenerative change of the thoracic spine is noted.  IMPRESSION: Bibasilar atelectasis left greater than right.   Electronically Signed   By: Inez Catalina M.D.   On: 04/27/2015 20:17    Dg Abd Portable 1v  04/27/2015   CLINICAL DATA:  Ileus  EXAM: PORTABLE ABDOMEN - 1 VIEW  COMPARISON:  04/24/2015  FINDINGS: Mild gaseous distention of multiple loops small bowel. Relative paucity of colonic air. There is no evidence of pneumoperitoneum, portal venous gas or pneumatosis. There are no pathologic calcifications along the expected course of the ureters.The osseous structures are unremarkable. Midline surgical staples are noted.  IMPRESSION: Mild gaseous distention of multiple loops of small bowel with relative paucity of colonic air. This likely reflects persistent ileus.   Electronically Signed   By: Kathreen Devoid   On: 04/27/2015 12:08    Scheduled Meds: . antiseptic oral rinse  7 mL Mouth Rinse q12n4p  . chlorhexidine  15 mL Mouth/Throat BID  . enoxaparin (LOVENOX) injection  40 mg Subcutaneous Q24H  . folic acid  1 mg Intravenous Daily  . levofloxacin (LEVAQUIN) IV  500 mg Intravenous Q24H  . loperamide  4 mg Oral BID AC  . multivitamin with minerals  1 tablet Oral Daily  . ondansetron (ZOFRAN) IV  4 mg Intravenous 4 times per day  . sodium chloride  3 mL Intravenous Q12H  . thiamine  100 mg Oral Daily   Or  . thiamine  100 mg Intravenous Daily   Continuous Infusions: . 0.9 % NaCl with KCl 20 mEq / L 100 mL/hr (04/29/15 0255)  . lactated ringers 10 mL/hr at 04/20/15 1003    Time spent: 25 minutes   [facility]  [email or web address], password [facility] 04/29/2015, 10:28 AM  LOS: 9 days

## 2015-04-30 LAB — GLUCOSE, CAPILLARY
GLUCOSE-CAPILLARY: 131 mg/dL — AB (ref 65–99)
Glucose-Capillary: 113 mg/dL — ABNORMAL HIGH (ref 65–99)
Glucose-Capillary: 129 mg/dL — ABNORMAL HIGH (ref 65–99)
Glucose-Capillary: 93 mg/dL (ref 65–99)

## 2015-04-30 LAB — URINE CULTURE

## 2015-04-30 LAB — CULTURE, BLOOD (ROUTINE X 2)

## 2015-04-30 MED ORDER — METHOCARBAMOL 750 MG PO TABS
750.0000 mg | ORAL_TABLET | Freq: Three times a day (TID) | ORAL | Status: DC | PRN
  Administered 2015-05-03: 750 mg via ORAL
  Filled 2015-04-30: qty 1

## 2015-04-30 MED ORDER — SACCHAROMYCES BOULARDII 250 MG PO CAPS
250.0000 mg | ORAL_CAPSULE | Freq: Two times a day (BID) | ORAL | Status: DC
  Administered 2015-04-30 – 2015-05-03 (×7): 250 mg via ORAL
  Filled 2015-04-30 (×7): qty 1

## 2015-04-30 MED ORDER — HYDROCODONE-ACETAMINOPHEN 5-325 MG PO TABS
1.0000 | ORAL_TABLET | Freq: Four times a day (QID) | ORAL | Status: DC | PRN
  Administered 2015-05-02 – 2015-05-03 (×2): 2 via ORAL
  Filled 2015-04-30 (×2): qty 2

## 2015-04-30 MED ORDER — PSYLLIUM 95 % PO PACK
1.0000 | PACK | Freq: Two times a day (BID) | ORAL | Status: DC
  Administered 2015-04-30 – 2015-05-02 (×5): 1 via ORAL
  Filled 2015-04-30 (×9): qty 1

## 2015-04-30 MED ORDER — SODIUM CHLORIDE 0.9 % IV BOLUS (SEPSIS)
1000.0000 mL | Freq: Once | INTRAVENOUS | Status: AC
  Administered 2015-04-30: 1000 mL via INTRAVENOUS

## 2015-04-30 MED ORDER — LOPERAMIDE HCL 2 MG PO CAPS
4.0000 mg | ORAL_CAPSULE | Freq: Three times a day (TID) | ORAL | Status: DC
  Administered 2015-04-30 – 2015-05-01 (×3): 4 mg via ORAL
  Filled 2015-04-30 (×3): qty 2

## 2015-04-30 MED ORDER — OPIUM 10 MG/ML (1%) PO TINC
6.0000 mg | Freq: Four times a day (QID) | ORAL | Status: DC
  Administered 2015-04-30 – 2015-05-03 (×11): 6 mg via ORAL
  Filled 2015-04-30 (×11): qty 1

## 2015-04-30 NOTE — Progress Notes (Signed)
[facility] Progress Note  10 Days Post-Op  Subjective: Pt says he's doing well.  His ileostomy pouch is having to be emptied every 2 hours.  It is full at the present.  No N/V, tolerating diet, but appetite not 100% yet.  Up in chair a lot.  Working with therapy.    Objective: Vital signs in last 24 hours: Temp:  [97.8 F (36.6 C)-98.8 F (37.1 C)] 98 F (36.7 C) (08/15 0812) Pulse Rate:  [75-92] 86 (08/15 0812) Resp:  [16-24] 16 (08/14 1643) BP: (105-121)/(37-78) 110/51 mmHg (08/15 0812) SpO2:  [96 %-100 %] 98 % (08/15 0812) Last BM Date: 04/29/15  Intake/Output from previous day: 08/14 0701 - 08/15 0700 In: 2241.7 [P.O.:720; I.V.:1521.7] Out: 5600 [Urine:450; KVQQV:9563] Intake/Output this shift: Total I/O In: 240 [P.O.:240] Out: 775 [Stool:775]  PE: Gen:  Alert, NAD, pleasant Abd: Soft, ND/ND, +BS, no HSM, incisions C/D/I with staples in place, minimal serosang drainage on midline gauze dressing, ostomy patent and pink with green clear liquid drainage and flatus in ileostomy bag.  Lab Results:   Recent Labs  04/28/15 0518 04/29/15 0535  WBC 20.4* 14.6*  HGB 8.2* 9.2*  HCT 26.6* 28.9*  PLT 251 227   BMET  Recent Labs  04/28/15 0518 04/29/15 0535  NA 138 135  K 3.5 3.7  CL 109 105  CO2 24 22  GLUCOSE 95 98  BUN 12 7  CREATININE 1.39* 1.05  CALCIUM 7.8* 8.1*   PT/INR No results for input(s): LABPROT, INR in the last 72 hours. CMP     Component Value Date/Time   NA 135 04/29/2015 0535   K 3.7 04/29/2015 0535   CL 105 04/29/2015 0535   CO2 22 04/29/2015 0535   GLUCOSE 98 04/29/2015 0535   BUN 7 04/29/2015 0535   CREATININE 1.05 04/29/2015 0535   CALCIUM 8.1* 04/29/2015 0535   PROT 5.0* 04/29/2015 0535   ALBUMIN 2.0* 04/29/2015 0535   AST 21 04/29/2015 0535   ALT 7* 04/29/2015 0535   ALKPHOS 76 04/29/2015 0535   BILITOT 0.4 04/29/2015 0535   GFRNONAA >60 04/29/2015 0535   GFRAA >60 04/29/2015 0535   Lipase     Component  Value Date/Time   LIPASE 17* 04/19/2015 1904       Studies/Results: Ct Abdomen Pelvis W Contrast  04/28/2015   CLINICAL DATA:  Leukocytosis, status post ex lap.  EXAM: CT ABDOMEN AND PELVIS WITH CONTRAST  TECHNIQUE: Multidetector CT imaging of the abdomen and pelvis was performed using the standard protocol following bolus administration of intravenous contrast.  CONTRAST:  12mL OMNIPAQUE IOHEXOL 300 MG/ML  SOLN  COMPARISON:  04/20/2015  FINDINGS: Lower chest:  Small left pleural effusion.  Bibasilar atelectasis.  Hepatobiliary: Multiple hypodense hepatic mass is unchanged in the prior exam likely representing cysts. 12 mm hypodense lesion in the right hepatic lobe superiorly may reflect a cyst versus hemangioma unchanged compared with 04/20/2015. Distended gallbladder.  Pancreas: Pancreas is normal.  Spleen: Spleen is normal.  Adrenals/Urinary Tract: Normal adrenal glands. Normal kidneys. Small amount air in the bladder which is likely secondary to instrumentation. High density material along the dependent portion of the bladder likely reflecting excreted contrast.  Stomach/Bowel: Mild gastric distension. Interval partial colectomy. There is a sigmoid stump terminating at the mid abdomen anteriorly. There is a small amount of contrast present within the sigmoid colon and rectum. There is a right lower quadrant ileostomy. There is diffuse small bowel wall thickening with multiple small bowel air-fluid  levels. There is no bowel obstruction. There is a moderate amount of pelvic free fluid. There is a small amount of fluid within the mesenteries. There is a mid midline abdominal wall incision.  Vascular/Lymphatic: Normal caliber abdominal aorta. No abdominal or pelvic lymphadenopathy. Abdominal aortic atherosclerosis.  Reproductive: Unremarkable prostate gland.  Other: No focal fluid collection or hematoma.  Musculoskeletal: No acute osseous abnormality. No lytic or sclerotic osseous lesion. Degenerative  changes of bilateral sacroiliac joints with and anterior left SI joint bridging osteophyte. Anterior bridging osteophytes of the thoracolumbar spine as can be seen with diffuse idiopathic skeletal hyperostosis. Bilateral facet arthropathy at L3-4, L4-5 and L5-S1.  IMPRESSION: 1. No evidence of intra-abdominal abscess. Right lower quadrant ileostomy. Diffuse small bowel wall thickening with multiple small bowel air-fluid levels without dilatation particularly involving the distal ileum. These likely represent postsurgical changes with possible enteritis in the appropriate clinical setting. There is same moderate amount of abdominal free fluid. 2. Interval subtotal colectomy with a sigmoid stump terminating in the mid abdomen anteriorly adjacent to a loop of ileum. Small amount of contrast is present within the sigmoid colon and rectum. 3. Trace right and small left pleural effusions with bibasilar atelectasis. 4. Small amount air in the bladder likely secondary to instrumentation. In the absence of instrumentation infection can present in this fashion.   Electronically Signed   By: Kathreen Devoid   On: 04/28/2015 13:44    Anti-infectives: Anti-infectives    Start     Dose/Rate Route Frequency Ordered Stop   04/29/15 1830  levofloxacin (LEVAQUIN) tablet 500 mg     500 mg Oral Daily 04/29/15 1038     04/27/15 1845  metroNIDAZOLE (FLAGYL) IVPB 500 mg  Status:  Discontinued     500 mg 100 mL/hr over 60 Minutes Intravenous Every 8 hours 04/27/15 1830 04/29/15 0745   04/27/15 1845  levofloxacin (LEVAQUIN) IVPB 500 mg  Status:  Discontinued     500 mg 100 mL/hr over 60 Minutes Intravenous Every 24 hours 04/27/15 1831 04/29/15 1038   04/21/15 1100  cefoTEtan (CEFOTAN) 2 g in dextrose 5 % 50 mL IVPB  Status:  Discontinued     2 g 100 mL/hr over 30 Minutes Intravenous  Once 04/20/15 1601 04/20/15 1658   04/20/15 1000  [MAR Hold]  cefoTEtan (CEFOTAN) 2 g in dextrose 5 % 50 mL IVPB     (MAR Hold since 04/20/15  0954)   2 g 100 mL/hr over 30 Minutes Intravenous To [facility] 04/20/15 0859 04/20/15 1115       Assessment/Plan SBO with ileocecal invasive adenocarcinoma POD #10, s/p Subtotal colectomy with terminal ileum and ileostomy High ileostomy output -Invasive adenocarcinoma with 7/26 lymph nodes positive -Oncology should be consulted -Pulm toilet and mobilize -WOC following -Tolerating regular diet -Staples to come out on POD #14, midline wound dressing change -Office to arrange f/u appt -Avoid narcotics and benzos as this may make the patient more confused. Minimal pain so tylenol or ultram for break through. -5L ileostomy output in last 24 hours.  Started metamucil BID, increased imodium to TID, and tincture or opium.  May need Sandostatin or cholestyramine if not improved by tomorrow.  Jehovah's Witness Alcohol use - d/c CIWA, cant be going through withdrawals from alcohol this far out DVT Proph - SCD's and lovenox  Disp - PT recommending SNF, from our perspective he could go when ostomy output improved so he is not at high risk for dehydration   LOS: 10 days  Nat Christen 04/30/2015, 10:35 AM Pager: [phone]

## 2015-04-30 NOTE — Progress Notes (Signed)
No change in d/c plan at this time. Awaiting stability per MD for d/c to SNF at [facility].  CSW services will continue to monitor and assist as needed with d/c.  Lorie Phenix. Pauline Good, [place]

## 2015-04-30 NOTE — Care Management Important Message (Signed)
Important Message  Patient Details  Name: Raghav Verrilli MRN: [id number] Date of Birth: 1930-05-15   Medicare Important Message Given:  Yes-second notification given    Pricilla Handler 04/30/2015, 3:16 PM

## 2015-04-30 NOTE — Progress Notes (Signed)
[facility] PROGRESS NOTE  Osborne Serio TGG:[id number] DOB: 05/11/1930 DOA: 04/19/2015 PCP: No PCP Per Patient HPI/Subjective: [name] PMHx Depression,HTN, ETOH Abuse, PUD, Jehovah's witness,   Presents with nausea, vomiting, abdominal distention and abdominal pain.  Patient reports that his symptoms started 2 days ago. He vomited 6-7 times a day today. His abdominal pain is located in the periumbilical area, constant, 5 out of 10 in severity, nonradiating. It is not aggravated or alleviated by any known factors. He does not have symptoms of UTI, no fever, chills, chest pain, shortness breath, diarrhea. No unilateral weakness, leg edema or rashes.  In ED, patient was found to have WBC 12.9, temperature normal, no tachycardia, electrolytes okay, is 19, negative urinalysis. CT abdomen/pelvis showed high-grade small bowel obstruction caused by a mass of the distal ileum or ileocecal valve. Multiple low-attenuation foci in the liver, more likely benign cysts but the smaller ones cannot be conclusively characterized. S/p subtotal colectomy, ileostomy. Had postoperative ileus which has resolved. Developed septic shock postoperatively, atrial fibrillation w RVR, high ileostomy output.  Assessment/Plan: Septic shock: enterobacter in blood. Urine culture pending. CT abd pelvis negative for abscess. Pharmacy changed levaquin to po. Improving. Treat for 7-14 days  Small bowel obstruction secondary to small bowel mass: Diet advanced. High output from ileostomy. imodium adjusted. On metamucil and tincutre of opium. outpt f/u with oncology once recovered from surgery. 5 liters behind. Will give liter saline and continue IVF  Alcohol abuse: No withdrawal  Hx of PUD and duodenal ulcer with hemorrhage: Change PPI to PO  A-Fib with RVR Resolved. chadsvasc 2 Not a candidate for anticoagulation at this time due to recent surgery, anemia, deconditioning with fall risk, alcohol abuse and refusal of blood  products should bleeding occur on anticoagulation  LVH -See A. fib with RVR  Hypokalemia improving  Code Status: Full Family Communication: None  Disposition Plan: SNF when iliostomy output improved   Consultants:  Dr.Matthew Donne Hazel ([facility])    Procedures: #1 subtotal colectomy, including terminal ileum #2 end ileostomy 8/7 echocardiogram;- Left ventricle: moderate concentric hypertrophy. -LVEF= 55% to 60%.  -(grade 1diastolic dysfunction) - Atrial septum: A patent foramen ovale cannot be excluded.   Cultures   Antibiotics: NA   DVT prophylaxis SCD  S: no complaints  Objective: Filed Vitals:   04/29/15 1957 04/30/15 0011 04/30/15 0400 04/30/15 0812  BP: 108/50 119/58 113/55 110/51  Pulse: 76 76 75 86  Temp: 98.4 F (36.9 C) 98.8 F (37.1 C) 98.8 F (37.1 C) 98 F (36.7 C)  TempSrc: Oral Oral Oral Oral  Resp:      Height:      Weight:      SpO2: 100% 98% 100% 98%    Intake/Output Summary (Last 24 hours) at 04/30/15 1233 Last data filed at 04/30/15 1100  Gross per 24 hour  Intake   1680 ml  Output   5575 ml  Net  -3895 ml   Filed Weights   04/26/15 0751 04/28/15 0444 04/29/15 0539  Weight: 96.072 kg (211 lb 12.8 oz) 98.9 kg (218 lb 0.6 oz) 95.845 kg (211 lb 4.8 oz)     Exam: General: a and o in chair Lungs: Clear to auscultation bilaterally without wheezes or crackles Cardiovascular: Regular rate and rhythm without murmur gallop or rub normal S1 and S2 Abdomen:  Incision ok. Ileostomy with dark liquidl. Bowel sounds present. Soft, nontender. Extremities: No significant cyanosis, clubbing, or edema bilateral lower extremities   Data Reviewed: Basic Metabolic Panel:  Recent Labs Lab 04/23/15 1336 04/25/15 0011 04/26/15 0425 04/27/15 0516 04/28/15 0518 04/29/15 0535  NA  --  137 140 140 138 135  K 3.6 3.7 3.1* 3.4* 3.5 3.7  CL  --  106 106 105 109 105  CO2  --  19* 25 28 24 22   GLUCOSE  --  95 145* 129* 95 98  BUN  --  18 14 8 12  7   CREATININE  --  1.26* 1.11 1.00 1.39* 1.05  CALCIUM  --  8.3* 8.3* 8.6* 7.8* 8.1*  MG 2.6*  --   --  2.2  --   --    Liver Function Tests:  Recent Labs Lab 04/28/15 0518 04/29/15 0535  AST 21 21  ALT 9* 7*  ALKPHOS 82 76  BILITOT 0.4 0.4  PROT 4.8* 5.0*  ALBUMIN 1.9* 2.0*   No results for input(s): LIPASE, AMYLASE in the last 168 hours. No results for input(s): AMMONIA in the last 168 hours. CBC:  Recent Labs Lab 04/26/15 0425 04/27/15 1710 04/27/15 1930 04/28/15 0518 04/29/15 0535  WBC 6.5 33.1* 30.3* 20.4* 14.6*  NEUTROABS  --  29.5* 27.3* 18.0*  --   HGB 10.0* 8.6* 9.5* 8.2* 9.2*  HCT 31.3* 27.3* 30.2* 26.6* 28.9*  MCV 77.9* 76.5* 78.6 77.3* 76.7*  PLT 252 226 218 251 227   Cardiac Enzymes: No results for input(s): CKTOTAL, CKMB, CKMBINDEX, TROPONINI in the last 168 hours. BNP (last 3 results) No results for input(s): BNP in the last 8760 hours.  ProBNP (last 3 results) No results for input(s): PROBNP in the last 8760 hours.  CBG:  Recent Labs Lab 04/29/15 1144 04/29/15 1649 04/29/15 2002 04/30/15 0725 04/30/15 1156  GLUCAP 133* 124* 104* 131* 113*    Recent Results (from the past 240 hour(s))  Culture, blood (routine x 2)     Status: None   Collection Time: 04/27/15  7:08 PM  Result Value Ref Range Status   Specimen Description BLOOD RIGHT HAND  Final   Special Requests BOTTLES DRAWN AEROBIC ONLY 5CC  Final   Culture  Setup Time   Final    GRAM NEGATIVE RODS AEROBIC BOTTLE ONLY CRITICAL RESULT CALLED TO, READ BACK BY AND VERIFIED WITH: Claudell Kyle 04/28/15 @ 80 M VESTAL    Culture   Final    ENTEROBACTER CLOACAE SUSCEPTIBILITIES PERFORMED ON PREVIOUS CULTURE WITHIN THE LAST 5 DAYS.    Report Status 04/30/2015 FINAL  Final  Culture, blood (routine x 2)     Status: None   Collection Time: 04/27/15  7:30 PM  Result Value Ref Range Status   Specimen Description BLOOD LEFT HAND  Final   Special Requests BOTTLES DRAWN AEROBIC ONLY 5CC  Final    Culture  Setup Time   Final    GRAM NEGATIVE RODS AEROBIC BOTTLE ONLY CRITICAL RESULT CALLED TO, READ BACK BY AND VERIFIED WITH: Claudell Kyle 04/28/15 @ [place]    Culture ENTEROBACTER CLOACAE  Final   Report Status 04/30/2015 FINAL  Final   Organism ID, Bacteria ENTEROBACTER CLOACAE  Final      Susceptibility   Enterobacter cloacae - MIC*    CEFAZOLIN >=64 RESISTANT Resistant     CEFEPIME <=1 SENSITIVE Sensitive     CEFTAZIDIME <=1 SENSITIVE Sensitive     CEFTRIAXONE <=1 SENSITIVE Sensitive     CIPROFLOXACIN <=0.25 SENSITIVE Sensitive     GENTAMICIN <=1 SENSITIVE Sensitive     IMIPENEM 0.5 SENSITIVE Sensitive  TRIMETH/SULFA <=20 SENSITIVE Sensitive     PIP/TAZO <=4 SENSITIVE Sensitive     * ENTEROBACTER CLOACAE  C difficile quick scan w PCR reflex     Status: None   Collection Time: 04/28/15 12:55 AM  Result Value Ref Range Status   C Diff antigen NEGATIVE NEGATIVE Final   C Diff toxin NEGATIVE NEGATIVE Final   C Diff interpretation Negative for toxigenic C. difficile  Final  Culture, Urine     Status: None (Preliminary result)   Collection Time: 04/29/15 11:47 AM  Result Value Ref Range Status   Specimen Description URINE, RANDOM  Final   Special Requests NONE  Final   Culture CULTURE REINCUBATED FOR BETTER GROWTH  Final   Report Status PENDING  Incomplete     Studies: Ct Abdomen Pelvis W Contrast  04/28/2015   CLINICAL DATA:  Leukocytosis, status post ex lap.  EXAM: CT ABDOMEN AND PELVIS WITH CONTRAST  TECHNIQUE: Multidetector CT imaging of the abdomen and pelvis was performed using the standard protocol following bolus administration of intravenous contrast.  CONTRAST:  145mL OMNIPAQUE IOHEXOL 300 MG/ML  SOLN  COMPARISON:  04/20/2015  FINDINGS: Lower chest:  Small left pleural effusion.  Bibasilar atelectasis.  Hepatobiliary: Multiple hypodense hepatic mass is unchanged in the prior exam likely representing cysts. 12 mm hypodense lesion in the right hepatic lobe  superiorly may reflect a cyst versus hemangioma unchanged compared with 04/20/2015. Distended gallbladder.  Pancreas: Pancreas is normal.  Spleen: Spleen is normal.  Adrenals/Urinary Tract: Normal adrenal glands. Normal kidneys. Small amount air in the bladder which is likely secondary to instrumentation. High density material along the dependent portion of the bladder likely reflecting excreted contrast.  Stomach/Bowel: Mild gastric distension. Interval partial colectomy. There is a sigmoid stump terminating at the mid abdomen anteriorly. There is a small amount of contrast present within the sigmoid colon and rectum. There is a right lower quadrant ileostomy. There is diffuse small bowel wall thickening with multiple small bowel air-fluid levels. There is no bowel obstruction. There is a moderate amount of pelvic free fluid. There is a small amount of fluid within the mesenteries. There is a mid midline abdominal wall incision.  Vascular/Lymphatic: Normal caliber abdominal aorta. No abdominal or pelvic lymphadenopathy. Abdominal aortic atherosclerosis.  Reproductive: Unremarkable prostate gland.  Other: No focal fluid collection or hematoma.  Musculoskeletal: No acute osseous abnormality. No lytic or sclerotic osseous lesion. Degenerative changes of bilateral sacroiliac joints with and anterior left SI joint bridging osteophyte. Anterior bridging osteophytes of the thoracolumbar spine as can be seen with diffuse idiopathic skeletal hyperostosis. Bilateral facet arthropathy at L3-4, L4-5 and L5-S1.  IMPRESSION: 1. No evidence of intra-abdominal abscess. Right lower quadrant ileostomy. Diffuse small bowel wall thickening with multiple small bowel air-fluid levels without dilatation particularly involving the distal ileum. These likely represent postsurgical changes with possible enteritis in the appropriate clinical setting. There is same moderate amount of abdominal free fluid. 2. Interval subtotal colectomy with a  sigmoid stump terminating in the mid abdomen anteriorly adjacent to a loop of ileum. Small amount of contrast is present within the sigmoid colon and rectum. 3. Trace right and small left pleural effusions with bibasilar atelectasis. 4. Small amount air in the bladder likely secondary to instrumentation. In the absence of instrumentation infection can present in this fashion.   Electronically Signed   By: Kathreen Devoid   On: 04/28/2015 13:44    Scheduled Meds: . antiseptic oral rinse  7 mL Mouth Rinse q12n4p  .  chlorhexidine  15 mL Mouth/Throat BID  . enoxaparin (LOVENOX) injection  40 mg Subcutaneous Q24H  . folic acid  1 mg Oral Daily  . levofloxacin  500 mg Oral Daily  . loperamide  4 mg Oral TID AC  . multivitamin with minerals  1 tablet Oral Daily  . ondansetron (ZOFRAN) IV  4 mg Intravenous 4 times per day  . Opium  6 mg Oral Q6H  . pantoprazole  40 mg Oral Daily  . psyllium  1 packet Oral BID  . saccharomyces boulardii  250 mg Oral BID  . sodium chloride  3 mL Intravenous Q12H  . thiamine  100 mg Oral Daily   Continuous Infusions: . 0.9 % NaCl with KCl 20 mEq / L 100 mL/hr at 04/30/15 0335  . lactated ringers 10 mL/hr at 04/20/15 1003    Time spent: 25 minutes   [facility]  [email or web address], password [facility] 04/30/2015, 12:33 PM  LOS: 10 days

## 2015-05-01 DIAGNOSIS — A419 Sepsis, unspecified organism: Secondary | ICD-10-CM

## 2015-05-01 DIAGNOSIS — K6389 Other specified diseases of intestine: Secondary | ICD-10-CM

## 2015-05-01 DIAGNOSIS — R6521 Severe sepsis with septic shock: Secondary | ICD-10-CM

## 2015-05-01 DIAGNOSIS — I4891 Unspecified atrial fibrillation: Secondary | ICD-10-CM

## 2015-05-01 LAB — BASIC METABOLIC PANEL
Anion gap: 5 (ref 5–15)
BUN: 6 mg/dL (ref 6–20)
CHLORIDE: 104 mmol/L (ref 101–111)
CO2: 23 mmol/L (ref 22–32)
CREATININE: 0.96 mg/dL (ref 0.61–1.24)
Calcium: 8.3 mg/dL — ABNORMAL LOW (ref 8.9–10.3)
GFR calc non Af Amer: >60 mL/min (ref >60)
Glucose, Bld: 102 mg/dL — ABNORMAL HIGH (ref 65–99)
Potassium: 4.2 mmol/L (ref 3.5–5.1)
Sodium: 132 mmol/L — ABNORMAL LOW (ref 135–145)

## 2015-05-01 LAB — GLUCOSE, CAPILLARY
GLUCOSE-CAPILLARY: 117 mg/dL — AB (ref 65–99)
Glucose-Capillary: 89 mg/dL (ref 65–99)
Glucose-Capillary: 125 mg/dL — ABNORMAL HIGH (ref 65–99)
Glucose-Capillary: 121 mg/dL — ABNORMAL HIGH (ref 65–99)

## 2015-05-01 LAB — CBC
HCT: 28.8 % — ABNORMAL LOW (ref 39.0–52.0)
Hemoglobin: 9.2 g/dL — ABNORMAL LOW (ref 13.0–17.0)
MCH: 24 pg — AB (ref 26.0–34.0)
MCHC: 31.9 g/dL (ref 30.0–36.0)
MCV: 75.2 fL — AB (ref 78.0–100.0)
PLATELETS: 247 10*3/uL (ref 150–400)
RBC: 3.83 MIL/uL — AB (ref 4.22–5.81)
RDW: 16.2 % — ABNORMAL HIGH (ref 11.5–15.5)
WBC: 11.2 10*3/uL — ABNORMAL HIGH (ref 4.0–10.5)

## 2015-05-01 MED ORDER — LOPERAMIDE HCL 2 MG PO CAPS
4.0000 mg | ORAL_CAPSULE | Freq: Three times a day (TID) | ORAL | Status: DC
  Administered 2015-05-01 (×3): 4 mg via ORAL
  Filled 2015-05-01 (×3): qty 2

## 2015-05-01 MED ORDER — FLUCONAZOLE 200 MG PO TABS
200.0000 mg | ORAL_TABLET | Freq: Every day | ORAL | Status: DC
  Administered 2015-05-01 – 2015-05-03 (×3): 200 mg via ORAL
  Filled 2015-05-01 (×4): qty 1

## 2015-05-01 NOTE — Consult Note (Signed)
WOC ostomy follow up Stoma type/location: RLQ, end ileostomy Stomal assessment/size: 1 3/4 slightly oval, budded from the skin  Peristomal assessment: intact, some mucocutaneous separation at 3 oclock with some slough noted today. Treatment options for stomal/peristomal skin: 2" skin barrier ring to aid with seal of high output stoma and mucocutaneous separation  Output liquid green  Ostomy pouching: 1pc.with 2" barrier ring Education provided:  Patient demonstrated lock and roll closure with some cuing.  I marked new pattern on ostomy wafer and patient with cuing cut new wafer.  Verbalized what barrier ring is used for, however he is having difficulty seeing his ostomy with bandage from midline wound dressing in place.  Patient was not able to remove ostomy pouch due to this.  Demonstrated cleansing his skin.  Demonstrated placement of new barrier ring and new ostomy pouch to patient, he closed lock and roll for me. Bedside nurse with me, and requested patient notify staff when needed emptying and they will assist him with getting up to the bathroom to empty.  I did place pouch downward so this would facilitate teaching patient to empty. Supplies ordered.  Enrolled patient in [place] Start Discharge program: Yes  [place] team will follow along for continued ostomy care and teaching. [place], [place]

## 2015-05-01 NOTE — Progress Notes (Signed)
Physical Therapy Treatment Patient Details Name: Harold Price MRN: [id number] DOB: 1930/08/10 Today's Date: 05/01/2015    History of Present Illness Patient reports that his symptoms started 2 days ago. He vomited 6-7 times a day today. His abdominal pain is located in the periumbilical area, constant, 5 out of 10 in severity, nonradiating. It is not aggravated or alleviated by any known factors. He does not have symptoms of UTI, no fever, chills, chest pain, shortness breath, diarrhea. No unilateral weakness, leg edema or rashes.  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     PT Comments    Pt is progressing better with his mobility, however, shows signs of poor safety awareness.  He said that he could walk without the RW and immediately upon standing without he had a significant LOB.  He continues to be appropriate for SNF level of rehab at discharge to help him regain some independence before going home.  PT will continue to follow acutely.   Follow Up Recommendations  SNF;Supervision/Assistance - 24 hour     Equipment Recommendations  None recommended by PT    Recommendations for Other Services    NA    Precautions / Restrictions Precautions Precautions: Fall Precaution Comments: monitor vitals Restrictions Weight Bearing Restrictions: No    Mobility  Bed Mobility               General bed mobility comments: not assessed  Transfers Overall transfer level: Needs assistance Equipment used: Rolling walker (2 wheeled) Transfers: Sit to/from Stand Sit to Stand: Min assist         General transfer comment: Min assist with multiple attempts from lower recliner chair. Better with multiple trials.   Ambulation/Gait Ambulation/Gait assistance: Min assist Ambulation Distance (Feet): 200 Feet Assistive device: Rolling walker (2 wheeled) Gait Pattern/deviations: Step-through pattern;Shuffle;Trunk flexed Gait velocity: decreased Gait velocity interpretation: Below normal  speed for age/gender General Gait Details: max verbal cues for upright posture.  Pt reports abdominal "pulling" with upright posutre.  Educated on the importance of posture to prevent scarring while he is healing.           Balance           Standing balance support: Bilateral upper extremity supported Standing balance-Leahy Scale: Poor Standing balance comment: Pt attempted to walk without RW at first and stumbled and had to have assist to prevent LOB.  RW placed in front of him for support.  Poor safety awareness.                     Cognition Arousal/Alertness: Awake/alert Behavior During Therapy: WFL for tasks assessed/performed Overall Cognitive Status: No family/caregiver present to determine baseline cognitive functioning                      Exercises General Exercises - Upper Extremity Shoulder Flexion: AROM;Both;10 reps;Seated Elbow Flexion: AROM;Both;10 reps;Seated General Exercises - Lower Extremity Long Arc Quad: AROM;Both;10 reps;Seated Hip ABduction/ADduction: AROM;Both;10 reps;Seated Hip Flexion/Marching: AROM;Both;10 reps;Seated Toe Raises: AROM;Both;10 reps;Seated Heel Raises: AROM;Both;10 reps;Seated    General Comments General comments (skin integrity, edema, etc.): BP soft, but stable, see vitals flow sheet for details.  Pt asymptomatic with gait.       Pertinent Vitals/Pain Pain Assessment: No/denies pain Pain Score: 5  Pain Location: chest and abdomen Pain Descriptors / Indicators: Cramping (felt like indigestion in chest) Pain Intervention(s): Monitored during session           PT Goals (current  goals can now be found in the care plan section) Acute Rehab PT Goals Patient Stated Goal: to go home after "rehab" Progress towards PT goals: Progressing toward goals    Frequency  Min 3X/week    PT Plan Current plan remains appropriate       End of Session Equipment Utilized During Treatment: Gait belt Activity  Tolerance: Patient limited by fatigue Patient left: in chair;with call bell/phone within reach     Time: 1159-1216 PT Time Calculation (min) (ACUTE ONLY): 17 min  Charges:  $Gait Training: 8-22 mins                      Macie Baum B. Aaran Enberg, PT, DPT [phone]   05/01/2015, 5:29 PM

## 2015-05-01 NOTE — Progress Notes (Signed)
[facility] PROGRESS NOTE  Harold Price VEH:[id number] DOB: 1930-02-27 DOA: 04/19/2015 PCP: No PCP Per Patient HPI/Subjective: [name] PMHx Depression,HTN, ETOH Abuse, PUD, Jehovah's witness,   Presents with nausea, vomiting, abdominal distention and abdominal pain.  Patient reports that his symptoms started 2 days ago. He vomited 6-7 times a day today. His abdominal pain is located in the periumbilical area, constant, 5 out of 10 in severity, nonradiating. It is not aggravated or alleviated by any known factors. He does not have symptoms of UTI, no fever, chills, chest pain, shortness breath, diarrhea. No unilateral weakness, leg edema or rashes.  In ED, patient was found to have WBC 12.9, temperature normal, no tachycardia, electrolytes okay, is 19, negative urinalysis. CT abdomen/pelvis showed high-grade small bowel obstruction caused by a mass of the distal ileum or ileocecal valve. Multiple low-attenuation foci in the liver, more likely benign cysts but the smaller ones cannot be conclusively characterized. S/p subtotal colectomy, ileostomy. Had postoperative ileus which has resolved. Developed septic shock postoperatively, atrial fibrillation w RVR, high ileostomy output.  Assessment/Plan: Septic shock: enterobacter in blood. Urine culture shows yeast- diflucan for 2 weeks. CT abd pelvis negative for abscess. Pharmacy changed levaquin to po. Improving. Treat for 7-14 days  Small bowel obstruction secondary to small bowel mass: Diet advanced. High output from ileostomy. imodium adjusted. On metamucil and tincutre of opium. outpt f/u with oncology once recovered from surgery.  Alcohol abuse: No withdrawal  Hx of PUD and duodenal ulcer with hemorrhage: Change PPI to PO  A-Fib with RVR Resolved. chadsvasc 2 Not a candidate for anticoagulation at this time due to recent surgery, anemia, deconditioning with fall risk, alcohol abuse and refusal of blood products should bleeding occur  on anticoagulation  LVH -See A. fib with RVR  Hypokalemia improving  Code Status: Full Family Communication: None  Disposition Plan: SNF when iliostomy output improved   Consultants:  Dr.Matthew Donne Hazel ([facility])    Procedures: #1 subtotal colectomy, including terminal ileum #2 end ileostomy 8/7 echocardiogram;- Left ventricle: moderate concentric hypertrophy. -LVEF= 55% to 60%.  -(grade 1diastolic dysfunction) - Atrial septum: A patent foramen ovale cannot be excluded.   Cultures   Antibiotics: NA   DVT prophylaxis SCD  S: some abd pain and "fullness"  Objective: Filed Vitals:   04/30/15 2000 05/01/15 0011 05/01/15 0500 05/01/15 0751  BP: 114/49 106/47 101/44 108/58  Pulse: 72 71 73 80  Temp: 98.5 F (36.9 C) 98.5 F (36.9 C) 98.4 F (36.9 C) 97.7 F (36.5 C)  TempSrc: Oral   Oral  Resp: 18 17 13    Height:      Weight:      SpO2: 100% 99% 100% 96%    Intake/Output Summary (Last 24 hours) at 05/01/15 0929 Last data filed at 05/01/15 0751  Gross per 24 hour  Intake   2000 ml  Output   2300 ml  Net   -300 ml   Filed Weights   04/26/15 0751 04/28/15 0444 04/29/15 0539  Weight: 96.072 kg (211 lb 12.8 oz) 98.9 kg (218 lb 0.6 oz) 95.845 kg (211 lb 4.8 oz)     Exam: General: a and o in chair Lungs: Clear to auscultation bilaterally without wheezes or crackles Cardiovascular: Regular rate and rhythm without murmur gallop or rub normal S1 and S2 Abdomen:  Incision ok. Ileostomy with dark liquidl. Bowel sounds present. Soft, nontender. Extremities: No significant cyanosis, clubbing, or edema bilateral lower extremities   Data Reviewed: Basic Metabolic Panel:  Recent  Labs Lab 04/26/15 0425 04/27/15 0516 04/28/15 0518 04/29/15 0535 05/01/15 0446  NA 140 140 138 135 132*  K 3.1* 3.4* 3.5 3.7 4.2  CL 106 105 109 105 104  CO2 25 28 24 22 23   GLUCOSE 145* 129* 95 98 102*  BUN 14 8 12 7 6   CREATININE 1.11 1.00 1.39* 1.05 0.96  CALCIUM 8.3*  8.6* 7.8* 8.1* 8.3*  MG  --  2.2  --   --   --    Liver Function Tests:  Recent Labs Lab 04/28/15 0518 04/29/15 0535  AST 21 21  ALT 9* 7*  ALKPHOS 82 76  BILITOT 0.4 0.4  PROT 4.8* 5.0*  ALBUMIN 1.9* 2.0*   No results for input(s): LIPASE, AMYLASE in the last 168 hours. No results for input(s): AMMONIA in the last 168 hours. CBC:  Recent Labs Lab 04/27/15 1710 04/27/15 1930 04/28/15 0518 04/29/15 0535 05/01/15 0446  WBC 33.1* 30.3* 20.4* 14.6* 11.2*  NEUTROABS 29.5* 27.3* 18.0*  --   --   HGB 8.6* 9.5* 8.2* 9.2* 9.2*  HCT 27.3* 30.2* 26.6* 28.9* 28.8*  MCV 76.5* 78.6 77.3* 76.7* 75.2*  PLT 226 218 251 227 247   Cardiac Enzymes: No results for input(s): CKTOTAL, CKMB, CKMBINDEX, TROPONINI in the last 168 hours. BNP (last 3 results) No results for input(s): BNP in the last 8760 hours.  ProBNP (last 3 results) No results for input(s): PROBNP in the last 8760 hours.  CBG:  Recent Labs Lab 04/30/15 0725 04/30/15 1156 04/30/15 1618 04/30/15 2108 05/01/15 0733  GLUCAP 131* 113* 129* 93 89    Recent Results (from the past 240 hour(s))  Culture, blood (routine x 2)     Status: None   Collection Time: 04/27/15  7:08 PM  Result Value Ref Range Status   Specimen Description BLOOD RIGHT HAND  Final   Special Requests BOTTLES DRAWN AEROBIC ONLY 5CC  Final   Culture  Setup Time   Final    GRAM NEGATIVE RODS AEROBIC BOTTLE ONLY CRITICAL RESULT CALLED TO, READ BACK BY AND VERIFIED WITH: Claudell Kyle 04/28/15 @ 16 M VESTAL    Culture   Final    ENTEROBACTER CLOACAE SUSCEPTIBILITIES PERFORMED ON PREVIOUS CULTURE WITHIN THE LAST 5 DAYS.    Report Status 04/30/2015 FINAL  Final  Culture, blood (routine x 2)     Status: None   Collection Time: 04/27/15  7:30 PM  Result Value Ref Range Status   Specimen Description BLOOD LEFT HAND  Final   Special Requests BOTTLES DRAWN AEROBIC ONLY 5CC  Final   Culture  Setup Time   Final    GRAM NEGATIVE RODS AEROBIC BOTTLE  ONLY CRITICAL RESULT CALLED TO, READ BACK BY AND VERIFIED WITH: Claudell Kyle 04/28/15 @ 69 M VESTAL    Culture ENTEROBACTER CLOACAE  Final   Report Status 04/30/2015 FINAL  Final   Organism ID, Bacteria ENTEROBACTER CLOACAE  Final      Susceptibility   Enterobacter cloacae - MIC*    CEFAZOLIN >=64 RESISTANT Resistant     CEFEPIME <=1 SENSITIVE Sensitive     CEFTAZIDIME <=1 SENSITIVE Sensitive     CEFTRIAXONE <=1 SENSITIVE Sensitive     CIPROFLOXACIN <=0.25 SENSITIVE Sensitive     GENTAMICIN <=1 SENSITIVE Sensitive     IMIPENEM 0.5 SENSITIVE Sensitive     TRIMETH/SULFA <=20 SENSITIVE Sensitive     PIP/TAZO <=4 SENSITIVE Sensitive     * ENTEROBACTER CLOACAE  C difficile quick scan w PCR  reflex     Status: None   Collection Time: 04/28/15 12:55 AM  Result Value Ref Range Status   C Diff antigen NEGATIVE NEGATIVE Final   C Diff toxin NEGATIVE NEGATIVE Final   C Diff interpretation Negative for toxigenic C. difficile  Final  Culture, Urine     Status: None   Collection Time: 04/29/15 11:47 AM  Result Value Ref Range Status   Specimen Description URINE, RANDOM  Final   Special Requests NONE  Final   Culture >=100,000 COLONIES/mL YEAST  Final   Report Status 04/30/2015 FINAL  Final     Studies: No results found.  Scheduled Meds: . antiseptic oral rinse  7 mL Mouth Rinse q12n4p  . chlorhexidine  15 mL Mouth/Throat BID  . enoxaparin (LOVENOX) injection  40 mg Subcutaneous Q24H  . folic acid  1 mg Oral Daily  . levofloxacin  500 mg Oral Daily  . loperamide  4 mg Oral TID AC  . multivitamin with minerals  1 tablet Oral Daily  . ondansetron (ZOFRAN) IV  4 mg Intravenous 4 times per day  . Opium  6 mg Oral Q6H  . pantoprazole  40 mg Oral Daily  . psyllium  1 packet Oral BID  . saccharomyces boulardii  250 mg Oral BID  . sodium chloride  3 mL Intravenous Q12H  . thiamine  100 mg Oral Daily   Continuous Infusions: . 0.9 % NaCl with KCl 20 mEq / L 100 mL/hr at 05/01/15 0223  .  lactated ringers 10 mL/hr at 04/20/15 1003    Time spent: 25 minutes   Atharva Mirsky  [facility]  [email or web address], password [facility] 05/01/2015, 9:29 AM  LOS: 11 days

## 2015-05-01 NOTE — Progress Notes (Signed)
[facility] Progress Note  11 Days Post-Op  Subjective: Pt feeling stronger every day.  Ostomy output was cut back by half and is now starting to have more formed stools in it.  He says they are still needing to empty it every 1.5-2 hours, but its improved.  Working with therapy.    Objective: Vital signs in last 24 hours: Temp:  [97.7 F (36.5 C)-98.5 F (36.9 C)] 97.7 F (36.5 C) (08/16 0751) Pulse Rate:  [71-80] 80 (08/16 0751) Resp:  [13-18] 13 (08/16 0500) BP: (101-114)/(44-58) 108/58 mmHg (08/16 0751) SpO2:  [96 %-100 %] 96 % (08/16 0751) Last BM Date: 05/01/15  Intake/Output from previous day: 08/15 0701 - 08/16 0700 In: 2240 [P.O.:240; I.V.:1000; IV Piggyback:1000] Out: 2825 [Urine:550; Stool:2275] Intake/Output this shift: Total I/O In: -  Out: 350 [Urine:250; Stool:100]  PE: Gen:  Alert, NAD, pleasant Abd: Soft, ND/ND, +BS, no HSM, incisions C/D/I with staples in place, minimal serosang drainage on midline gauze dressing, ostomy patent and pink with brown/yellow pellet stool and flatus in ileostomy bag.   Lab Results:   Recent Labs  04/29/15 0535 05/01/15 0446  WBC 14.6* 11.2*  HGB 9.2* 9.2*  HCT 28.9* 28.8*  PLT 227 247   BMET  Recent Labs  04/29/15 0535 05/01/15 0446  NA 135 132*  K 3.7 4.2  CL 105 104  CO2 22 23  GLUCOSE 98 102*  BUN 7 6  CREATININE 1.05 0.96  CALCIUM 8.1* 8.3*   PT/INR No results for input(s): LABPROT, INR in the last 72 hours. CMP     Component Value Date/Time   NA 132* 05/01/2015 0446   K 4.2 05/01/2015 0446   CL 104 05/01/2015 0446   CO2 23 05/01/2015 0446   GLUCOSE 102* 05/01/2015 0446   BUN 6 05/01/2015 0446   CREATININE 0.96 05/01/2015 0446   CALCIUM 8.3* 05/01/2015 0446   PROT 5.0* 04/29/2015 0535   ALBUMIN 2.0* 04/29/2015 0535   AST 21 04/29/2015 0535   ALT 7* 04/29/2015 0535   ALKPHOS 76 04/29/2015 0535   BILITOT 0.4 04/29/2015 0535   GFRNONAA >60 05/01/2015 0446   GFRAA >60 05/01/2015  0446   Lipase     Component Value Date/Time   LIPASE 17* 04/19/2015 1904       Studies/Results: No results found.  Anti-infectives: Anti-infectives    Start     Dose/Rate Route Frequency Ordered Stop   05/01/15 1000  fluconazole (DIFLUCAN) tablet 200 mg     200 mg Oral Daily 05/01/15 0932     04/29/15 1830  levofloxacin (LEVAQUIN) tablet 500 mg     500 mg Oral Daily 04/29/15 1038     04/27/15 1845  metroNIDAZOLE (FLAGYL) IVPB 500 mg  Status:  Discontinued     500 mg 100 mL/hr over 60 Minutes Intravenous Every 8 hours 04/27/15 1830 04/29/15 0745   04/27/15 1845  levofloxacin (LEVAQUIN) IVPB 500 mg  Status:  Discontinued     500 mg 100 mL/hr over 60 Minutes Intravenous Every 24 hours 04/27/15 1831 04/29/15 1038   04/21/15 1100  cefoTEtan (CEFOTAN) 2 g in dextrose 5 % 50 mL IVPB  Status:  Discontinued     2 g 100 mL/hr over 30 Minutes Intravenous  Once 04/20/15 1601 04/20/15 1658   04/20/15 1000  [MAR Hold]  cefoTEtan (CEFOTAN) 2 g in dextrose 5 % 50 mL IVPB     (MAR Hold since 04/20/15 0954)   2 g 100 mL/hr over 30  Minutes Intravenous To [facility] 04/20/15 0859 04/20/15 1115       Assessment/Plan 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 Stool now formed.  Metamucil BID, increased imodium to QID, and tincture or opium. May need Sandostatin or cholestyramine if not improving. -Electrolytes are okay today, WBC down to 11.2 ID -ENTEROBACTER CLOACAE on cultures from 04/27/15, on levoquin and diflucan Jehovah's Witness Alcohol use - d/c CIWA, cant be going  through withdrawals from alcohol this far out DVT Proph - SCD's and lovenox  Disp - PT recommending SNF, from our perspective he could go when ostomy output improved so he is not at high risk for dehydration - maybe tomorrow    LOS: 11 days    Nat Christen 05/01/2015, 11:05 AM Pager: [phone]

## 2015-05-01 NOTE — Progress Notes (Addendum)
Occupational Therapy Treatment Patient Details Name: Harold Price MRN: [id number] DOB: 02/06/1930 Today's Date: 05/01/2015    History of present illness Patient reports that his symptoms started 2 days ago. He vomited 6-7 times a day today. His abdominal pain is located in the periumbilical area, constant, 5 out of 10 in severity, nonradiating. It is not aggravated or alleviated by any known factors. He does not have symptoms of UTI, no fever, chills, chest pain, shortness breath, diarrhea. No unilateral weakness, leg edema or rashes.  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    OT comments  Pt progressing and able to perform ADLs/functional mobility in room. Will continue to follow acutely.  Follow Up Recommendations  SNF    Equipment Recommendations  Other (comment) (defer to next venue)    Recommendations for Other Services      Precautions / Restrictions Precautions Precautions: Fall Precaution Comments: monitor O2 Restrictions Weight Bearing Restrictions: No       Mobility Bed Mobility               General bed mobility comments: not assessed  Transfers Overall transfer level: Needs assistance   Transfers: Sit to/from Stand Sit to Stand: Min guard         General transfer comment: pt used RW for support    Balance    Min guard for ambulation-pt used RW.                               ADL Overall ADL's : Needs assistance/impaired     Grooming: Wash/dry hands;Min guard;Standing   Upper Body Bathing: Min guard;Standing Upper Body Bathing Details (indicate cue type and reason): washed armpits Lower Body Bathing: Min guard (standing-washed bottom and peri area and top of upper leg)       Lower Body Dressing: Modified independent;Sitting/lateral leans Lower Body Dressing Details (indicate cue type and reason): donned/doffed socks in chair-crossed legs over knees Toilet Transfer: Min guard;Ambulation;RW (sit to stand from chair)    Toileting- Clothing Manipulation and Hygiene: Min guard (pt positoned urinal for pt and then he was able to hold it) Toileting - Clothing Manipulation Details (indicate cue type and reason): performed in standing position.     Functional mobility during ADLs: Min guard;Rolling walker General ADL Comments: Pt attempted to reach down to floor to get urinal in session (OT retrieved it for him)      Vision                     Perception     Praxis      Cognition  Awake/Alert Behavior During Therapy: flat affect Overall Cognitive Status: Overall seemed WFL- decreased safety awareness                       Extremity/Trunk Assessment               Exercises     Shoulder Instructions       General Comments      Pertinent Vitals/ Pain       Pain Assessment: 0-10 Pain Score: 5  Pain Location: chest and abdomen Pain Descriptors / Indicators: Cramping (felt like indigestion in chest) Pain Intervention(s): Monitored during session  Home Living  Prior Functioning/Environment              Frequency Min 2X/week     Progress Toward Goals  OT Goals(current goals can now be found in the care plan section)  Progress towards OT goals: Progressing toward goals-updated most goals  Acute Rehab OT Goals Patient Stated Goal: not stated OT Goal Formulation: With patient Time For Goal Achievement: 05/08/15 Potential to Achieve Goals: Good ADL Goals Pt Will Perform Lower Body Bathing: with set-up;sit to/from stand Pt Will Perform Lower Body Dressing: with set-up;sit to/from stand Pt Will Transfer to Toilet: ambulating;with supervision Pt Will Perform Toileting - Clothing Manipulation and hygiene: with set-up;sit to/from stand  Plan Discharge plan remains appropriate    Co-evaluation                 End of Session Equipment Utilized During Treatment: Gait belt;Rolling walker   Activity  Tolerance Patient tolerated treatment well   Patient Left in chair;with call bell/phone within reach   Nurse Communication  no chair alarm on pt-nurse reports he calls; mobility status; decreased safety awareness in session        Time: 1431-1447 OT Time Calculation (min): 16 min  Charges: OT General Charges $OT Visit: 1 Procedure OT Treatments $Self Care/Home Management : 8-22 mins  Benito Mccreedy OTR/L [phone] 05/01/2015, 5:06 PM

## 2015-05-01 NOTE — Progress Notes (Signed)
CSW (Clinical Education officer, museum) notified facility of potential for [employer].   [place], [place]

## 2015-05-02 LAB — GLUCOSE, CAPILLARY
GLUCOSE-CAPILLARY: 128 mg/dL — AB (ref 65–99)
GLUCOSE-CAPILLARY: 122 mg/dL — AB (ref 65–99)
GLUCOSE-CAPILLARY: 114 mg/dL — AB (ref 65–99)
Glucose-Capillary: 116 mg/dL — ABNORMAL HIGH (ref 65–99)

## 2015-05-02 MED ORDER — LOPERAMIDE HCL 2 MG PO CAPS
4.0000 mg | ORAL_CAPSULE | Freq: Four times a day (QID) | ORAL | Status: DC
  Administered 2015-05-02 – 2015-05-03 (×4): 4 mg via ORAL
  Filled 2015-05-02 (×4): qty 2

## 2015-05-02 NOTE — Progress Notes (Signed)
[facility] PROGRESS NOTE  Harold Price PFX:[id number] DOB: Sep 17, 1929 DOA: 04/19/2015 PCP: No PCP Per Patient  HPI/Subjective: [name] PMHx Depression,HTN, ETOH Abuse, PUD, Jehovah's witness,   Presents with nausea, vomiting, abdominal distention and abdominal pain.  Patient reports that his symptoms started 2 days ago. He vomited 6-7 times a day today. His abdominal pain is located in the periumbilical area, constant, 5 out of 10 in severity, nonradiating. It is not aggravated or alleviated by any known factors. He does not have symptoms of UTI, no fever, chills, chest pain, shortness breath, diarrhea. No unilateral weakness, leg edema or rashes.  In ED, patient was found to have WBC 12.9, temperature normal, no tachycardia, electrolytes okay, is 19, negative urinalysis. CT abdomen/pelvis showed high-grade small bowel obstruction caused by a mass of the distal ileum or ileocecal valve. Multiple low-attenuation foci in the liver, more likely benign cysts but the smaller ones cannot be conclusively characterized. S/p subtotal colectomy, ileostomy. Had postoperative ileus which has resolved. Developed septic shock postoperatively, atrial fibrillation w RVR, high ileostomy output.  No complaints   Assessment/Plan: Septic shock: enterobacter in blood. Urine culture shows yeast- diflucan for 2 weeks. CT abd pelvis negative for abscess. Pharmacy changed levaquin to po. Improving. Treat for 7-14 days  Small bowel obstruction secondary to small bowel mass: Diet advanced. High output from ileostomy. imodium adjusted. On metamucil and tincutre of opium. outpt f/u with oncology once recovered from surgery.  Alcohol abuse: No withdrawal  Hx of PUD and duodenal ulcer with hemorrhage: Change PPI to PO  A-Fib with RVR Resolved. chadsvasc 2 Not a candidate for anticoagulation at this time due to recent surgery, anemia, deconditioning with fall risk, alcohol abuse and refusal of blood products  should bleeding occur on anticoagulation  LVH -See A. fib with RVR  Hypokalemia improving  Code Status: Full Family Communication: None  Disposition Plan: SNF when iliostomy output decreased to < 1500cc   Consultants:  Dr.Matthew Donne Hazel ([facility])    Procedures: #1 subtotal colectomy, including terminal ileum #2 end ileostomy 8/7 echocardiogram;- Left ventricle: moderate concentric hypertrophy. -LVEF= 55% to 60%.  -(grade 1diastolic dysfunction) - Atrial septum: A patent foramen ovale cannot be excluded.   Cultures   Antibiotics: NA   DVT prophylaxis SCD    Objective: Filed Vitals:   05/01/15 2103 05/01/15 2343 05/02/15 0529 05/02/15 0730  BP: 128/50 114/50 131/51 120/50  Pulse: 86 75 76 73  Temp: 98.2 F (36.8 C) 98 F (36.7 C) 97.7 F (36.5 C) 97.8 F (36.6 C)  TempSrc: Axillary Oral Oral Oral  Resp: 18 16 16    Height:      Weight:   97.977 kg (216 lb)   SpO2: 100% 98% 98% 99%    Intake/Output Summary (Last 24 hours) at 05/02/15 1012 Last data filed at 05/02/15 0548  Gross per 24 hour  Intake   2680 ml  Output   3950 ml  Net  -1270 ml   Filed Weights   04/28/15 0444 04/29/15 0539 05/02/15 0529  Weight: 98.9 kg (218 lb 0.6 oz) 95.845 kg (211 lb 4.8 oz) 97.977 kg (216 lb)     Exam: General: a and o in chair Lungs: Clear to auscultation bilaterally without wheezes or crackles Cardiovascular: Regular rate and rhythm without murmur gallop or rub normal S1 and S2 Abdomen:  Incision ok. Ileostomy with dark liquidl. Bowel sounds present. Soft, nontender. Extremities: No significant cyanosis, clubbing, or edema bilateral lower extremities   Data Reviewed: Basic Metabolic  Panel:  Recent Labs Lab 04/26/15 0425 04/27/15 0516 04/28/15 0518 04/29/15 0535 05/01/15 0446  NA 140 140 138 135 132*  K 3.1* 3.4* 3.5 3.7 4.2  CL 106 105 109 105 104  CO2 25 28 24 22 23   GLUCOSE 145* 129* 95 98 102*  BUN 14 8 12 7 6   CREATININE 1.11 1.00 1.39* 1.05  0.96  CALCIUM 8.3* 8.6* 7.8* 8.1* 8.3*  MG  --  2.2  --   --   --    Liver Function Tests:  Recent Labs Lab 04/28/15 0518 04/29/15 0535  AST 21 21  ALT 9* 7*  ALKPHOS 82 76  BILITOT 0.4 0.4  PROT 4.8* 5.0*  ALBUMIN 1.9* 2.0*   No results for input(s): LIPASE, AMYLASE in the last 168 hours. No results for input(s): AMMONIA in the last 168 hours. CBC:  Recent Labs Lab 04/27/15 1710 04/27/15 1930 04/28/15 0518 04/29/15 0535 05/01/15 0446  WBC 33.1* 30.3* 20.4* 14.6* 11.2*  NEUTROABS 29.5* 27.3* 18.0*  --   --   HGB 8.6* 9.5* 8.2* 9.2* 9.2*  HCT 27.3* 30.2* 26.6* 28.9* 28.8*  MCV 76.5* 78.6 77.3* 76.7* 75.2*  PLT 226 218 251 227 247   Cardiac Enzymes: No results for input(s): CKTOTAL, CKMB, CKMBINDEX, TROPONINI in the last 168 hours. BNP (last 3 results) No results for input(s): BNP in the last 8760 hours.  ProBNP (last 3 results) No results for input(s): PROBNP in the last 8760 hours.  CBG:  Recent Labs Lab 05/01/15 0733 05/01/15 1133 05/01/15 1634 05/01/15 2102 05/02/15 0728  GLUCAP 89 125* 117* 121* 116*    Recent Results (from the past 240 hour(s))  Culture, blood (routine x 2)     Status: None   Collection Time: 04/27/15  7:08 PM  Result Value Ref Range Status   Specimen Description BLOOD RIGHT HAND  Final   Special Requests BOTTLES DRAWN AEROBIC ONLY 5CC  Final   Culture  Setup Time   Final    GRAM NEGATIVE RODS AEROBIC BOTTLE ONLY CRITICAL RESULT CALLED TO, READ BACK BY AND VERIFIED WITH: Claudell Kyle 04/28/15 @ 24 M VESTAL    Culture   Final    ENTEROBACTER CLOACAE SUSCEPTIBILITIES PERFORMED ON PREVIOUS CULTURE WITHIN THE LAST 5 DAYS.    Report Status 04/30/2015 FINAL  Final  Culture, blood (routine x 2)     Status: None   Collection Time: 04/27/15  7:30 PM  Result Value Ref Range Status   Specimen Description BLOOD LEFT HAND  Final   Special Requests BOTTLES DRAWN AEROBIC ONLY 5CC  Final   Culture  Setup Time   Final    GRAM NEGATIVE  RODS AEROBIC BOTTLE ONLY CRITICAL RESULT CALLED TO, READ BACK BY AND VERIFIED WITH: Claudell Kyle 04/28/15 @ 39 M VESTAL    Culture ENTEROBACTER CLOACAE  Final   Report Status 04/30/2015 FINAL  Final   Organism ID, Bacteria ENTEROBACTER CLOACAE  Final      Susceptibility   Enterobacter cloacae - MIC*    CEFAZOLIN >=64 RESISTANT Resistant     CEFEPIME <=1 SENSITIVE Sensitive     CEFTAZIDIME <=1 SENSITIVE Sensitive     CEFTRIAXONE <=1 SENSITIVE Sensitive     CIPROFLOXACIN <=0.25 SENSITIVE Sensitive     GENTAMICIN <=1 SENSITIVE Sensitive     IMIPENEM 0.5 SENSITIVE Sensitive     TRIMETH/SULFA <=20 SENSITIVE Sensitive     PIP/TAZO <=4 SENSITIVE Sensitive     * ENTEROBACTER CLOACAE  C difficile quick  scan w PCR reflex     Status: None   Collection Time: 04/28/15 12:55 AM  Result Value Ref Range Status   C Diff antigen NEGATIVE NEGATIVE Final   C Diff toxin NEGATIVE NEGATIVE Final   C Diff interpretation Negative for toxigenic C. difficile  Final  Culture, Urine     Status: None   Collection Time: 04/29/15 11:47 AM  Result Value Ref Range Status   Specimen Description URINE, RANDOM  Final   Special Requests NONE  Final   Culture >=100,000 COLONIES/mL YEAST  Final   Report Status 04/30/2015 FINAL  Final     Studies: No results found.  Scheduled Meds: . antiseptic oral rinse  7 mL Mouth Rinse q12n4p  . chlorhexidine  15 mL Mouth/Throat BID  . enoxaparin (LOVENOX) injection  40 mg Subcutaneous Q24H  . fluconazole  200 mg Oral Daily  . folic acid  1 mg Oral Daily  . levofloxacin  500 mg Oral Daily  . loperamide  4 mg Oral QID  . multivitamin with minerals  1 tablet Oral Daily  . ondansetron (ZOFRAN) IV  4 mg Intravenous 4 times per day  . Opium  6 mg Oral Q6H  . pantoprazole  40 mg Oral Daily  . psyllium  1 packet Oral BID  . saccharomyces boulardii  250 mg Oral BID  . sodium chloride  3 mL Intravenous Q12H  . thiamine  100 mg Oral Daily   Continuous Infusions: . 0.9 % NaCl  with KCl 20 mEq / L 100 mL/hr at 05/01/15 2314  . lactated ringers Stopped (04/29/15 1300)    Time spent: 25 minutes   Annya Lizana  [facility]  [email or web address], password [facility] 05/02/2015, 10:12 AM  LOS: 12 days

## 2015-05-02 NOTE — Progress Notes (Signed)
Patient ID: Harold Price, male   DOB: 1930-03-01, 81 y.o.   MRN: [id number] 12 Days Post-Op  Subjective: Pt feels well today.  Tolerating solid diet with no issues.  Output he thinks seems to be going down  Objective: Vital signs in last 24 hours: Temp:  [97.7 F (36.5 C)-98.2 F (36.8 C)] 97.8 F (36.6 C) (08/17 0730) Pulse Rate:  [73-86] 73 (08/17 0730) Resp:  [16-18] 16 (08/17 0529) BP: (107-131)/(45-51) 120/50 mmHg (08/17 0730) SpO2:  [98 %-100 %] 99 % (08/17 0730) Weight:  [97.977 kg (216 lb)] 97.977 kg (216 lb) (08/17 0529) Last BM Date: 05/01/15  Intake/Output from previous day: 08/16 0701 - 08/17 0700 In: 2680 [P.O.:400; I.V.:2280] Out: 4300 [Urine:2300; Stool:2000] Intake/Output this shift:    PE: Abd: soft, minimally tender, +BS, ileostomy with some thicker pieces present, but still quite thin.  Midline incision is clean, but with some serous drainage at the top.  Lab Results:   Recent Labs  05/01/15 0446  WBC 11.2*  HGB 9.2*  HCT 28.8*  PLT 247   BMET  Recent Labs  05/01/15 0446  NA 132*  K 4.2  CL 104  CO2 23  GLUCOSE 102*  BUN 6  CREATININE 0.96  CALCIUM 8.3*   PT/INR No results for input(s): LABPROT, INR in the last 72 hours. CMP     Component Value Date/Time   NA 132* 05/01/2015 0446   K 4.2 05/01/2015 0446   CL 104 05/01/2015 0446   CO2 23 05/01/2015 0446   GLUCOSE 102* 05/01/2015 0446   BUN 6 05/01/2015 0446   CREATININE 0.96 05/01/2015 0446   CALCIUM 8.3* 05/01/2015 0446   PROT 5.0* 04/29/2015 0535   ALBUMIN 2.0* 04/29/2015 0535   AST 21 04/29/2015 0535   ALT 7* 04/29/2015 0535   ALKPHOS 76 04/29/2015 0535   BILITOT 0.4 04/29/2015 0535   GFRNONAA >60 05/01/2015 0446   GFRAA >60 05/01/2015 0446   Lipase     Component Value Date/Time   LIPASE 17* 04/19/2015 1904       Studies/Results: No results found.  Anti-infectives: Anti-infectives    Start     Dose/Rate Route Frequency Ordered Stop   05/01/15 1000  fluconazole  (DIFLUCAN) tablet 200 mg     200 mg Oral Daily 05/01/15 0932     04/29/15 1830  levofloxacin (LEVAQUIN) tablet 500 mg     500 mg Oral Daily 04/29/15 1038     04/27/15 1845  metroNIDAZOLE (FLAGYL) IVPB 500 mg  Status:  Discontinued     500 mg 100 mL/hr over 60 Minutes Intravenous Every 8 hours 04/27/15 1830 04/29/15 0745   04/27/15 1845  levofloxacin (LEVAQUIN) IVPB 500 mg  Status:  Discontinued     500 mg 100 mL/hr over 60 Minutes Intravenous Every 24 hours 04/27/15 1831 04/29/15 1038   04/21/15 1100  cefoTEtan (CEFOTAN) 2 g in dextrose 5 % 50 mL IVPB  Status:  Discontinued     2 g 100 mL/hr over 30 Minutes Intravenous  Once 04/20/15 1601 04/20/15 1658   04/20/15 1000  [MAR Hold]  cefoTEtan (CEFOTAN) 2 g in dextrose 5 % 50 mL IVPB     (MAR Hold since 04/20/15 0954)   2 g 100 mL/hr over 30 Minutes Intravenous To [facility] 04/20/15 0859 04/20/15 1115       Assessment/Plan 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 stool still thin, but with some thicker pieces. Metamucil BID, increased imodium to 4mg  QID today, and tincture or opium. May need Sandostatin or cholestyramine if not improving. ID -ENTEROBACTER CLOACAE on cultures from 04/27/15, on levoquin and diflucan Jehovah's Witness Alcohol use  DVT Proph - SCD's and lovenox  Disp - PT recommending SNF, ostomy output needs to still be less prior to DC or he will come right back with dehydration.  Would ideally like this below 1000-1500cc/day  LOS: 12 days    Ronette Hank E 05/02/2015, 9:14 AM Pager: [phone]

## 2015-05-02 NOTE — Progress Notes (Addendum)
Patient discussed with Dr. Eliseo Squires. Not stable today for d/c.  Needs output to illeostomy to be less than 1500 cc.  Possible d/c to SNF tomorrow or most likely Friday. Bed is available at [facility].  CSW services will continue to monitor.  Lorie Phenix. Pauline Good, [place] (coverage)

## 2015-05-03 DIAGNOSIS — I517 Cardiomegaly: Secondary | ICD-10-CM

## 2015-05-03 DIAGNOSIS — C260 Malignant neoplasm of intestinal tract, part unspecified: Secondary | ICD-10-CM

## 2015-05-03 LAB — CBC
HCT: 30.1 % — ABNORMAL LOW (ref 39.0–52.0)
Hemoglobin: 9.6 g/dL — ABNORMAL LOW (ref 13.0–17.0)
MCH: 24.2 pg — ABNORMAL LOW (ref 26.0–34.0)
MCHC: 31.9 g/dL (ref 30.0–36.0)
MCV: 75.8 fL — ABNORMAL LOW (ref 78.0–100.0)
PLATELETS: 306 10*3/uL (ref 150–400)
RBC: 3.97 MIL/uL — ABNORMAL LOW (ref 4.22–5.81)
RDW: 16.3 % — AB (ref 11.5–15.5)
WBC: 12 10*3/uL — AB (ref 4.0–10.5)

## 2015-05-03 LAB — BASIC METABOLIC PANEL
ANION GAP: 6 (ref 5–15)
BUN: <5 mg/dL — ABNORMAL LOW (ref 6–20)
CALCIUM: 8.9 mg/dL (ref 8.9–10.3)
CO2: 24 mmol/L (ref 22–32)
Chloride: 104 mmol/L (ref 101–111)
Creatinine, Ser: 0.94 mg/dL (ref 0.61–1.24)
Glucose, Bld: 127 mg/dL — ABNORMAL HIGH (ref 65–99)
Potassium: 4.3 mmol/L (ref 3.5–5.1)
SODIUM: 134 mmol/L — AB (ref 135–145)

## 2015-05-03 LAB — GLUCOSE, CAPILLARY
GLUCOSE-CAPILLARY: 130 mg/dL — AB (ref 65–99)
GLUCOSE-CAPILLARY: 95 mg/dL (ref 65–99)

## 2015-05-03 MED ORDER — MAGIC MOUTHWASH W/LIDOCAINE: 5.0000 mL | Freq: Four times a day (QID) | ORAL | Status: DC | PRN

## 2015-05-03 MED ORDER — THIAMINE HCL 100 MG PO TABS: 100.0000 mg | ORAL_TABLET | Freq: Every day | ORAL | Status: DC

## 2015-05-03 MED ORDER — MENTHOL 3 MG MT LOZG: 1.0000 | LOZENGE | OROMUCOSAL | Status: DC | PRN

## 2015-05-03 MED ORDER — SACCHAROMYCES BOULARDII 250 MG PO CAPS: 250.0000 mg | ORAL_CAPSULE | Freq: Two times a day (BID) | ORAL | Status: DC

## 2015-05-03 MED ORDER — HYDROCODONE-ACETAMINOPHEN 5-325 MG PO TABS: 1.0000 | ORAL_TABLET | Freq: Four times a day (QID) | ORAL | Status: DC | PRN

## 2015-05-03 MED ORDER — LEVOFLOXACIN 500 MG PO TABS: 500.0000 mg | ORAL_TABLET | Freq: Every day | ORAL | Status: DC

## 2015-05-03 MED ORDER — PANTOPRAZOLE SODIUM 40 MG PO TBEC: 40.0000 mg | DELAYED_RELEASE_TABLET | Freq: Every day | ORAL | Status: DC

## 2015-05-03 MED ORDER — FOLIC ACID 1 MG PO TABS: 1.0000 mg | ORAL_TABLET | Freq: Every day | ORAL | Status: DC

## 2015-05-03 MED ORDER — CHLORHEXIDINE GLUCONATE 0.12 % MT SOLN: 15.0000 mL | Freq: Two times a day (BID) | OROMUCOSAL | Status: DC

## 2015-05-03 MED ORDER — TRAMADOL HCL 50 MG PO TABS: 50.0000 mg | ORAL_TABLET | Freq: Four times a day (QID) | ORAL | Status: DC | PRN

## 2015-05-03 MED ORDER — METHOCARBAMOL 750 MG PO TABS: 750.0000 mg | ORAL_TABLET | Freq: Three times a day (TID) | ORAL | Status: DC | PRN

## 2015-05-03 MED ORDER — CETYLPYRIDINIUM CHLORIDE 0.05 % MT LIQD: 7.0000 mL | Freq: Two times a day (BID) | OROMUCOSAL | Status: DC

## 2015-05-03 MED ORDER — FLUCONAZOLE 200 MG PO TABS: 200.0000 mg | ORAL_TABLET | Freq: Every day | ORAL | Status: DC

## 2015-05-03 MED ORDER — LOPERAMIDE HCL 2 MG PO CAPS: 4.0000 mg | ORAL_CAPSULE | Freq: Four times a day (QID) | ORAL | Status: DC

## 2015-05-03 MED ORDER — PSYLLIUM 95 % PO PACK: 1.0000 | PACK | Freq: Two times a day (BID) | ORAL | Status: DC

## 2015-05-03 MED ORDER — ACETAMINOPHEN 325 MG PO TABS: 650.0000 mg | ORAL_TABLET | Freq: Four times a day (QID) | ORAL | Status: DC | PRN

## 2015-05-03 NOTE — Progress Notes (Signed)
[facility] Progress Note  13 Days Post-Op  Subjective: Pt doing well, having some cramping, but ostomy output is down to 350 from 2000 yesterday.  No complaints other than feeling a bit bloated.  He's tolerating a diet well.  Working with therapy.  Ready to d/c to SNF.    Objective: Vital signs in last 24 hours: Temp:  [97.9 F (36.6 C)-98.4 F (36.9 C)] 98 F (36.7 C) (08/18 0411) Pulse Rate:  [67-72] 70 (08/18 0411) Resp:  [16] 16 (08/18 0411) BP: (112-131)/(41-63) 131/42 mmHg (08/18 0411) SpO2:  [99 %-100 %] 100 % (08/18 0411) Weight:  [95.255 kg (210 lb)] 95.255 kg (210 lb) (08/18 0411) Last BM Date: 05/01/15  Intake/Output from previous day: 08/17 0701 - 08/18 0700 In: 240 [P.O.:240] Out: 2805 [Urine:2455; Stool:350] Intake/Output this shift:    PE: Gen:  Alert, NAD, pleasant Abd: Soft, NT/ND, +BS, no HSM, incisional staples removed, small superior aspect of wound opened, now packed with WD dressing, minimal serosang drainage on midline gauze dressing, no foul smell.  ostomy patent and pink with brown/yellow pasty stool and flatus in ileostomy bag (328mL/24hr)   Lab Results:   Recent Labs  05/01/15 0446 05/03/15 0646  WBC 11.2* 12.0*  HGB 9.2* 9.6*  HCT 28.8* 30.1*  PLT 247 306   BMET  Recent Labs  05/01/15 0446 05/03/15 0646  NA 132* 134*  K 4.2 4.3  CL 104 104  CO2 23 24  GLUCOSE 102* 127*  BUN 6 <5*  CREATININE 0.96 0.94  CALCIUM 8.3* 8.9   PT/INR No results for input(s): LABPROT, INR in the last 72 hours. CMP     Component Value Date/Time   NA 134* 05/03/2015 0646   K 4.3 05/03/2015 0646   CL 104 05/03/2015 0646   CO2 24 05/03/2015 0646   GLUCOSE 127* 05/03/2015 0646   BUN <5* 05/03/2015 0646   CREATININE 0.94 05/03/2015 0646   CALCIUM 8.9 05/03/2015 0646   PROT 5.0* 04/29/2015 0535   ALBUMIN 2.0* 04/29/2015 0535   AST 21 04/29/2015 0535   ALT 7* 04/29/2015 0535   ALKPHOS 76 04/29/2015 0535   BILITOT 0.4 04/29/2015  0535   GFRNONAA >60 05/03/2015 0646   GFRAA >60 05/03/2015 0646   Lipase     Component Value Date/Time   LIPASE 17* 04/19/2015 1904       Studies/Results: No results found.  Anti-infectives: Anti-infectives    Start     Dose/Rate Route Frequency Ordered Stop   05/01/15 1000  fluconazole (DIFLUCAN) tablet 200 mg     200 mg Oral Daily 05/01/15 0932     04/29/15 1830  levofloxacin (LEVAQUIN) tablet 500 mg     500 mg Oral Daily 04/29/15 1038     04/27/15 1845  metroNIDAZOLE (FLAGYL) IVPB 500 mg  Status:  Discontinued     500 mg 100 mL/hr over 60 Minutes Intravenous Every 8 hours 04/27/15 1830 04/29/15 0745   04/27/15 1845  levofloxacin (LEVAQUIN) IVPB 500 mg  Status:  Discontinued     500 mg 100 mL/hr over 60 Minutes Intravenous Every 24 hours 04/27/15 1831 04/29/15 1038   04/21/15 1100  cefoTEtan (CEFOTAN) 2 g in dextrose 5 % 50 mL IVPB  Status:  Discontinued     2 g 100 mL/hr over 30 Minutes Intravenous  Once 04/20/15 1601 04/20/15 1658   04/20/15 1000  [MAR Hold]  cefoTEtan (CEFOTAN) 2 g in dextrose 5 % 50 mL IVPB     (MAR  Hold since 04/20/15 0954)   2 g 100 mL/hr over 30 Minutes Intravenous To [facility] 04/20/15 0859 04/20/15 1115       Assessment/Plan SBO with ileocecal invasive adenocarcinoma POD #13, s/p Subtotal colectomy with terminal ileum and ileostomy High ileostomy output -Invasive adenocarcinoma with 7/26 lymph nodes positive -Oncology will see him as an outpatient -Pulm toilet and mobilize -WOC following -Tolerating regular diet -Follow up arranged -360mL/24 hour much improved ileostomy output in last 24 hours. Stool more paste like.  Metamucil BID, increased imodium to 4mg  QID today.  D/c Tincture of opium, continue with metamucil and imodium, but may need to slowly reduce imodium over the next few days since stool is thickening.  Discussed this with Dr. Eliseo Squires. -I discontinued his staples today and a small portion of the upper incision opened  slightly.  Will need WD dressing change daily until healed ID -ENTEROBACTER CLOACAE on cultures from 04/27/15, on levoquin and diflucan Jehovah's Witness Alcohol use  DVT Proph - SCD's and lovenox  Disp - PT recommending SNF, Ostomy output improved. Would ideally like this below 1000-1500cc/day.  D/c to SNF today.  Needs dressing changes and routine ostomy care at SNF.      LOS: 13 days    Nat Christen 05/03/2015, 9:47 AM Pager: [phone]

## 2015-05-03 NOTE — Discharge Summary (Signed)
Physician Discharge Summary  Harold Price HYI:[id number] DOB: 1929-11-11 DOA: 04/19/2015  PCP: No PCP Per Patient  Admit date: 04/19/2015 Discharge date: 05/03/2015  Time spent: 35 minutes  Recommendations for Outpatient Follow-up:  Wean off immodium: with goal of < 1200 ml of stool daily-- would change from QID to TID to BID to PRN Diflucan until 8/31 levaquin until 8/29 To SNF Appointment with [facility] oncology GI clinic No blood transfusions as patient is Jehovah's Witness   Discharge Diagnoses:  Principal Problem:   Small bowel obstruction Active Problems:   Alcohol abuse   Refusal of blood transfusions as patient is Jehovah's Witness   Duodenal ulcer with hemorrhage   Hypertension   PUD (peptic ulcer disease)   Depression   Small bowel mass   Leukocytosis   Cancer of abdominal organ   Hypokalemia   LVH (left ventricular hypertrophy)   Hypophosphatemia   Atrial fibrillation with RVR   Septic shock   Discharge Condition: improved  Diet recommendation: regular  Filed Weights   04/29/15 0539 05/02/15 0529 05/03/15 0411  Weight: 95.845 kg (211 lb 4.8 oz) 97.977 kg (216 lb) 95.255 kg (210 lb)    History of present illness:  Harold Price is a 81 y.o. male with PMH of hypertension, Jehovah's witness, depression, peptic ulcer disease, arthritis, who presents with nausea, vomiting, abdominal distention and abdominal pain.  Patient reports that his symptoms started 2 days ago. He vomited 6-7 times a day today. His abdominal pain is located in the periumbilical area, constant, 5 out of 10 in severity, nonradiating. It is not aggravated or alleviated by any known factors. He does not have symptoms of UTI, no fever, chills, chest pain, shortness breath, diarrhea. No unilateral weakness, leg edema or rashes.  In ED, patient was found to have WBC 12.9, temperature normal, no tachycardia, electrolytes okay, is 19, negative urinalysis. CT abdomen/pelvis showed high-grade small bowel  obstruction caused by a mass of the distal ileum or ileocecal valve. Multiple low-attenuation foci in the liver, more likely benign cysts but the smaller ones cannot be conclusively characterized. Patient is admitted to inpatient for further evaluation and treatment. General surgery was consulted by ED.  Hospital Course:  Septic shock: enterobacter in blood. Urine culture shows yeast- diflucan for 2 weeks.- stop date above  CT abd pelvis negative for abscess. Pharmacy changed levaquin to po. Improving. Treat for 7-14 days  Small bowel obstruction secondary to small bowel mass: Diet advanced. High output from ileostomy. imodium to be weaned off as above instructions. On metamucil. outpt f/u with oncology once recovered from surgery.  Alcohol abuse: No withdrawal  Hx of PUD and duodenal ulcer with hemorrhage: PPI  A-Fib with RVR Resolved. chadsvasc 2 Not a candidate for anticoagulation at this time due to recent surgery, anemia, deconditioning with fall risk, alcohol abuse and refusal of blood products should bleeding occur on anticoagulation  LVH -See A. fib with RVR  Hypokalemia improving   Consultants:  Dr.Matthew Donne Hazel ([facility])    Procedures: #1 subtotal colectomy, including terminal ileum #2 end ileostomy 8/7 echocardiogram;- Left ventricle: moderate concentric hypertrophy. -LVEF= 55% to 60%.  -(grade 1diastolic dysfunction) - Atrial septum: A patent foramen ovale cannot be excluded.  Discharge Exam: Filed Vitals:   05/03/15 0411  BP: 131/42  Pulse: 70  Temp: 98 F (36.7 C)  Resp: 16    General: A+OX3, NAD   Discharge Instructions   Discharge Instructions    Change dressing    Complete by:  As directed  WD dressing changes to small incisional wound (used 2x2 gauze packing), change dressing daily until healed.  Also routine ostomy care.     Diet general    Complete by:  As directed      Discharge instructions    Complete by:  As directed   Wean off  immodium: with goal of < 1200 ml of stool daily-- would change from QID to TID to BID to PRN Diflucan until 8/31 levaquin until 8/29     Increase activity slowly    Complete by:  As directed      Measure stoma output    Complete by:  As directed   Measure stoma output daily.  We want to make sure its less than 1,212mL/24hours to prevent dehydration.  May need to titrate imodium.  He can start with QID imodium, but may need to backed off to TID or BID or discontinue.  Call the [place] office if ostomy output is >1232mL/24 hour period.          Current Discharge Medication List    START taking these medications   Details  acetaminophen (TYLENOL) 325 MG tablet Take 2 tablets (650 mg total) by mouth every 6 (six) hours as needed for mild pain or moderate pain.    Alum & Mag Hydroxide-Simeth (MAGIC MOUTHWASH W/LIDOCAINE) SOLN Take 5 mLs by mouth 4 (four) times daily as needed for mouth pain. Refills: 0    antiseptic oral rinse (CPC / CETYLPYRIDINIUM CHLORIDE 0.05%) 0.05 % LIQD solution 7 mLs by Mouth Rinse route 2 times daily at 12 noon and 4 pm. Refills: 0    chlorhexidine (PERIDEX) 0.12 % solution Use as directed 15 mLs in the mouth or throat 2 (two) times daily. Qty: 120 mL, Refills: 0    fluconazole (DIFLUCAN) 200 MG tablet Take 1 tablet (200 mg total) by mouth daily.    folic acid (FOLVITE) 1 MG tablet Take 1 tablet (1 mg total) by mouth daily. Qty: 30 tablet, Refills: 0    HYDROcodone-acetaminophen (NORCO/VICODIN) 5-325 MG per tablet Take 1-2 tablets by mouth every 6 (six) hours as needed (for breakthrough only, use ultram and tylenol first!). Qty: 15 tablet, Refills: 0    levofloxacin (LEVAQUIN) 500 MG tablet Take 1 tablet (500 mg total) by mouth daily.    loperamide (IMODIUM) 2 MG capsule Take 2 capsules (4 mg total) by mouth 4 (four) times daily. Qty: 30 capsule, Refills: 0    menthol-cetylpyridinium (CEPACOL) 3 MG lozenge Take 1 lozenge (3 mg total) by mouth as needed for  sore throat. Qty: 100 tablet, Refills: 12    methocarbamol (ROBAXIN) 750 MG tablet Take 1 tablet (750 mg total) by mouth every 8 (eight) hours as needed for muscle spasms. Qty: 15 tablet, Refills: 0    pantoprazole (PROTONIX) 40 MG tablet Take 1 tablet (40 mg total) by mouth daily.    psyllium (HYDROCIL/METAMUCIL) 95 % PACK Take 1 packet by mouth 2 (two) times daily. Qty: 56 each    saccharomyces boulardii (FLORASTOR) 250 MG capsule Take 1 capsule (250 mg total) by mouth 2 (two) times daily.    thiamine 100 MG tablet Take 1 tablet (100 mg total) by mouth daily.    traMADol (ULTRAM) 50 MG tablet Take 1 tablet (50 mg total) by mouth every 6 (six) hours as needed for moderate pain or severe pain. Qty: 15 tablet, Refills: 0      CONTINUE these medications which have NOT CHANGED   Details  calcium carbonate (  TUMS - DOSED IN MG ELEMENTAL CALCIUM) 500 MG chewable tablet Chew 1 tablet by mouth as needed for heartburn.    Multiple Vitamin (MULTIVITAMIN WITH MINERALS) TABS Take 1 tablet by mouth daily.      STOP taking these medications     alum & mag hydroxide-simeth (MAALOX/MYLANTA) 200-200-20 MG/5ML suspension        No Known Allergies Follow-up Information    Follow up with [facility], [place]. Schedule an appointment as soon as possible for a visit on 04/27/2015.   Specialty:  General Surgery   Why:  For post-operation check.  Pending appointment time/date from office.   Contact information:   [address] [place] [address] [phone]        The results of significant diagnostics from this hospitalization (including imaging, microbiology, ancillary and laboratory) are listed below for reference.    Significant Diagnostic Studies: Ct Abdomen Pelvis W Contrast  04/28/2015   CLINICAL DATA:  Leukocytosis, status post ex lap.  EXAM: CT ABDOMEN AND PELVIS WITH CONTRAST  TECHNIQUE: Multidetector CT imaging of the abdomen and pelvis was performed using the  standard protocol following bolus administration of intravenous contrast.  CONTRAST:  13mL OMNIPAQUE IOHEXOL 300 MG/ML  SOLN  COMPARISON:  04/20/2015  FINDINGS: Lower chest:  Small left pleural effusion.  Bibasilar atelectasis.  Hepatobiliary: Multiple hypodense hepatic mass is unchanged in the prior exam likely representing cysts. 12 mm hypodense lesion in the right hepatic lobe superiorly may reflect a cyst versus hemangioma unchanged compared with 04/20/2015. Distended gallbladder.  Pancreas: Pancreas is normal.  Spleen: Spleen is normal.  Adrenals/Urinary Tract: Normal adrenal glands. Normal kidneys. Small amount air in the bladder which is likely secondary to instrumentation. High density material along the dependent portion of the bladder likely reflecting excreted contrast.  Stomach/Bowel: Mild gastric distension. Interval partial colectomy. There is a sigmoid stump terminating at the mid abdomen anteriorly. There is a small amount of contrast present within the sigmoid colon and rectum. There is a right lower quadrant ileostomy. There is diffuse small bowel wall thickening with multiple small bowel air-fluid levels. There is no bowel obstruction. There is a moderate amount of pelvic free fluid. There is a small amount of fluid within the mesenteries. There is a mid midline abdominal wall incision.  Vascular/Lymphatic: Normal caliber abdominal aorta. No abdominal or pelvic lymphadenopathy. Abdominal aortic atherosclerosis.  Reproductive: Unremarkable prostate gland.  Other: No focal fluid collection or hematoma.  Musculoskeletal: No acute osseous abnormality. No lytic or sclerotic osseous lesion. Degenerative changes of bilateral sacroiliac joints with and anterior left SI joint bridging osteophyte. Anterior bridging osteophytes of the thoracolumbar spine as can be seen with diffuse idiopathic skeletal hyperostosis. Bilateral facet arthropathy at L3-4, L4-5 and L5-S1.  IMPRESSION: 1. No evidence of  intra-abdominal abscess. Right lower quadrant ileostomy. Diffuse small bowel wall thickening with multiple small bowel air-fluid levels without dilatation particularly involving the distal ileum. These likely represent postsurgical changes with possible enteritis in the appropriate clinical setting. There is same moderate amount of abdominal free fluid. 2. Interval subtotal colectomy with a sigmoid stump terminating in the mid abdomen anteriorly adjacent to a loop of ileum. Small amount of contrast is present within the sigmoid colon and rectum. 3. Trace right and small left pleural effusions with bibasilar atelectasis. 4. Small amount air in the bladder likely secondary to instrumentation. In the absence of instrumentation infection can present in this fashion.   Electronically Signed   By: Kathreen Devoid  On: 04/28/2015 13:44   Ct Abdomen Pelvis W Contrast  04/20/2015   CLINICAL DATA:  Generalized abdominal pain onset yesterday. Now with multiple episodes of vomiting.  EXAM: CT ABDOMEN AND PELVIS WITH CONTRAST  TECHNIQUE: Multidetector CT imaging of the abdomen and pelvis was performed using the standard protocol following bolus administration of intravenous contrast.  CONTRAST:  139mL OMNIPAQUE IOHEXOL 300 MG/ML  SOLN  COMPARISON:  None.  FINDINGS: There is a high-grade small bowel obstruction caused by a 4 x 6 x 8 cm mass of the distal ileum or ileocecal valve. There are enlarged right lower quadrant mesenteric nodes which in some areas appear to be matted together, measuring up to 2 x 3 cm in aggregate and with individual short axis dimensions of approximately 1.1 - 1.5 cm. There is no free intraperitoneal air. The colon is mildly distended with fluid.  There are multiple low-attenuation foci in the liver which more likely represent cysts although the smaller ones cannot be conclusively characterized. There are unremarkable appearances of the spleen, pancreas and adrenals. The left kidney is normal. The right  kidney has a mild rotational anomaly and appears rather small but otherwise is unremarkable. No nodules are evident in the lung bases, but there is some mild linear scarring or atelectasis. No significant skeletal lesions are evident.  IMPRESSION: 1. High-grade small bowel obstruction caused by a mass of the distal ileum or ileocecal valve. Leading considerations would be carcinoid, adenocarcinoma, lymphoma. There appear to be involved nodes in the right lower quadrant. 2. Multiple low-attenuation foci in the liver, more likely benign cysts but the smaller ones cannot be conclusively characterized. The largest lesion measures 2.5 cm in the left hepatic lobe and is water density.   Electronically Signed   By: Andreas Newport M.D.   On: 04/20/2015 03:06   Dg Chest Port 1 View  04/27/2015   CLINICAL DATA:  Productive cough  EXAM: PORTABLE CHEST - 1 VIEW  COMPARISON:  11/06/2012  FINDINGS: Cardiac shadow is stable. The lungs are well aerated bilaterally. Minimal basilar atelectasis is noted left slightly greater than right. No bony abnormality is seen. Stable degenerative change of the thoracic spine is noted.  IMPRESSION: Bibasilar atelectasis left greater than right.   Electronically Signed   By: Inez Catalina M.D.   On: 04/27/2015 20:17   Dg Abd Portable 1v  04/27/2015   CLINICAL DATA:  Ileus  EXAM: PORTABLE ABDOMEN - 1 VIEW  COMPARISON:  04/24/2015  FINDINGS: Mild gaseous distention of multiple loops small bowel. Relative paucity of colonic air. There is no evidence of pneumoperitoneum, portal venous gas or pneumatosis. There are no pathologic calcifications along the expected course of the ureters.The osseous structures are unremarkable. Midline surgical staples are noted.  IMPRESSION: Mild gaseous distention of multiple loops of small bowel with relative paucity of colonic air. This likely reflects persistent ileus.   Electronically Signed   By: Kathreen Devoid   On: 04/27/2015 12:08   Dg Abd Portable  1v  04/24/2015   CLINICAL DATA:  Vomiting.  Nasogastric tube placement  EXAM: PORTABLE ABDOMEN - 1 VIEW  COMPARISON:  CT abdomen and pelvis April 20, 2015  FINDINGS: Nasogastric tube tip and side port are in the stomach. There are multiple loops of dilated bowel consistent with either ileus or a degree of obstruction. No free air is seen on this supine examination. There is bibasilar atelectatic change.  IMPRESSION: Nasogastric tube tip and side port in stomach. Multiple loops of dilated  bowel consistent with either ileus or a degree of bowel obstruction.   Electronically Signed   By: Lowella Grip III M.D.   On: 04/24/2015 21:39    Microbiology: Recent Results (from the past 240 hour(s))  Culture, blood (routine x 2)     Status: None   Collection Time: 04/27/15  7:08 PM  Result Value Ref Range Status   Specimen Description BLOOD RIGHT HAND  Final   Special Requests BOTTLES DRAWN AEROBIC ONLY 5CC  Final   Culture  Setup Time   Final    GRAM NEGATIVE RODS AEROBIC BOTTLE ONLY CRITICAL RESULT CALLED TO, READ BACK BY AND VERIFIED WITH: Claudell Kyle 04/28/15 @ 56 M VESTAL    Culture   Final    ENTEROBACTER CLOACAE SUSCEPTIBILITIES PERFORMED ON PREVIOUS CULTURE WITHIN THE LAST 5 DAYS.    Report Status 04/30/2015 FINAL  Final  Culture, blood (routine x 2)     Status: None   Collection Time: 04/27/15  7:30 PM  Result Value Ref Range Status   Specimen Description BLOOD LEFT HAND  Final   Special Requests BOTTLES DRAWN AEROBIC ONLY 5CC  Final   Culture  Setup Time   Final    GRAM NEGATIVE RODS AEROBIC BOTTLE ONLY CRITICAL RESULT CALLED TO, READ BACK BY AND VERIFIED WITH: Claudell Kyle 04/28/15 @ 28 M VESTAL    Culture ENTEROBACTER CLOACAE  Final   Report Status 04/30/2015 FINAL  Final   Organism ID, Bacteria ENTEROBACTER CLOACAE  Final      Susceptibility   Enterobacter cloacae - MIC*    CEFAZOLIN >=64 RESISTANT Resistant     CEFEPIME <=1 SENSITIVE Sensitive     CEFTAZIDIME <=1 SENSITIVE  Sensitive     CEFTRIAXONE <=1 SENSITIVE Sensitive     CIPROFLOXACIN <=0.25 SENSITIVE Sensitive     GENTAMICIN <=1 SENSITIVE Sensitive     IMIPENEM 0.5 SENSITIVE Sensitive     TRIMETH/SULFA <=20 SENSITIVE Sensitive     PIP/TAZO <=4 SENSITIVE Sensitive     * ENTEROBACTER CLOACAE  C difficile quick scan w PCR reflex     Status: None   Collection Time: 04/28/15 12:55 AM  Result Value Ref Range Status   C Diff antigen NEGATIVE NEGATIVE Final   C Diff toxin NEGATIVE NEGATIVE Final   C Diff interpretation Negative for toxigenic C. difficile  Final  Culture, Urine     Status: None   Collection Time: 04/29/15 11:47 AM  Result Value Ref Range Status   Specimen Description URINE, RANDOM  Final   Special Requests NONE  Final   Culture >=100,000 COLONIES/mL YEAST  Final   Report Status 04/30/2015 FINAL  Final     Labs: Basic Metabolic Panel:  Recent Labs Lab 04/27/15 0516 04/28/15 0518 04/29/15 0535 05/01/15 0446 05/03/15 0646  NA 140 138 135 132* 134*  K 3.4* 3.5 3.7 4.2 4.3  CL 105 109 105 104 104  CO2 28 24 22 23 24   GLUCOSE 129* 95 98 102* 127*  BUN 8 12 7 6  <5*  CREATININE 1.00 1.39* 1.05 0.96 0.94  CALCIUM 8.6* 7.8* 8.1* 8.3* 8.9  MG 2.2  --   --   --   --    Liver Function Tests:  Recent Labs Lab 04/28/15 0518 04/29/15 0535  AST 21 21  ALT 9* 7*  ALKPHOS 82 76  BILITOT 0.4 0.4  PROT 4.8* 5.0*  ALBUMIN 1.9* 2.0*   No results for input(s): LIPASE, AMYLASE in the last 168 hours. No  results for input(s): AMMONIA in the last 168 hours. CBC:  Recent Labs Lab 04/27/15 1710 04/27/15 1930 04/28/15 0518 04/29/15 0535 05/01/15 0446 05/03/15 0646  WBC 33.1* 30.3* 20.4* 14.6* 11.2* 12.0*  NEUTROABS 29.5* 27.3* 18.0*  --   --   --   HGB 8.6* 9.5* 8.2* 9.2* 9.2* 9.6*  HCT 27.3* 30.2* 26.6* 28.9* 28.8* 30.1*  MCV 76.5* 78.6 77.3* 76.7* 75.2* 75.8*  PLT 226 218 251 227 247 306   Cardiac Enzymes: No results for input(s): CKTOTAL, CKMB, CKMBINDEX, TROPONINI in the  last 168 hours. BNP: BNP (last 3 results) No results for input(s): BNP in the last 8760 hours.  ProBNP (last 3 results) No results for input(s): PROBNP in the last 8760 hours.  CBG:  Recent Labs Lab 05/02/15 0728 05/02/15 1129 05/02/15 1639 05/02/15 2124 05/03/15 0730  GLUCAP 116* 128* 122* 114* 130*       Signed:  Jaycie Kregel  [facility] 05/03/2015, 10:45 AM

## 2015-05-03 NOTE — Care Management Important Message (Signed)
Important Message  Patient Details  Name: Harold Price MRN: [id number] Date of Birth: [birth year]   Medicare Important Message Given:  Yes-third notification given    Pricilla Handler 05/03/2015, 2:17 PM

## 2015-05-03 NOTE — Discharge Instructions (Signed)
**Has small wound at the top of his incision which needs to be packed daily with WD dressing until healed.  Staples removed prior to discharge.  **  [facility], [place] [phone]  OPEN ABDOMINAL SURGERY: POST OP INSTRUCTIONS  Always review your discharge instruction sheet given to you by the facility where your surgery was performed.  IF YOU HAVE DISABILITY OR FAMILY LEAVE FORMS, YOU MUST BRING THEM TO THE OFFICE FOR PROCESSING.  PLEASE DO NOT GIVE THEM TO YOUR DOCTOR.  1. A prescription for pain medication may be given to you upon discharge.  Take your pain medication as prescribed, if needed.  If narcotic pain medicine is not needed, then you may take acetaminophen (Tylenol) or ibuprofen (Advil) as needed. 2. Take your usually prescribed medications unless otherwise directed. 3. If you need a refill on your pain medication, please contact your pharmacy. They will contact our office to request authorization.  Prescriptions will not be filled after 5pm or on week-ends. 4. You should follow a light diet the first few days after arrival home, such as soup and crackers, pudding, etc.unless your doctor has advised otherwise. A high-fiber, low fat diet can be resumed as tolerated.   Be sure to include lots of fluids daily. Most patients will experience some swelling and bruising on the chest and neck area.  Ice packs will help.  Swelling and bruising can take several days to resolve 5. Most patients will experience some swelling and bruising in the area of the incision. Ice pack will help. Swelling and bruising can take several days to resolve..  6. It is common to experience some constipation if taking pain medication after surgery.  Increasing fluid intake and taking a stool softener will usually help or prevent this problem from occurring.  A mild laxative (Milk of Magnesia or Miralax) should be taken according to package directions if there are no bowel movements after 48  hours. 7.  You may have steri-strips (small skin tapes) in place directly over the incision.  These strips should be left on the skin for 7-10 days.  If your surgeon used skin glue on the incision, you may shower in 24 hours.  The glue will flake off over the next 2-3 weeks.  Any sutures or staples will be removed at the office during your follow-up visit. You may find that a light gauze bandage over your incision may keep your staples from being rubbed or pulled. You may shower and replace the bandage daily. 8. ACTIVITIES:  You may resume regular (light) daily activities beginning the next day--such as daily self-care, walking, climbing stairs--gradually increasing activities as tolerated.  You may have sexual intercourse when it is comfortable.  Refrain from any heavy lifting or straining until approved by your doctor. a. You may drive when you no longer are taking prescription pain medication, you can comfortably wear a seatbelt, and you can safely maneuver your car and apply brakes b. Return to Work: ___________________________________ 8. You should see your doctor in the office for a follow-up appointment approximately two weeks after your surgery.  Make sure that you call for this appointment within a day or two after you arrive home to insure a convenient appointment time. OTHER INSTRUCTIONS:  _____________________________________________________________ _____________________________________________________________  WHEN TO CALL YOUR DOCTOR: 1. Fever over 101.0 2. Inability to urinate 3. Nausea and/or vomiting 4. Extreme swelling or bruising 5. Continued bleeding from incision. 6. Increased pain, redness, or drainage from the  incision. 7. Difficulty swallowing or breathing 8. Muscle cramping or spasms. 9. Numbness or tingling in hands or feet or around lips.  The clinic staff is available to answer your questions during regular business hours.  Please dont hesitate to call and ask to  speak to one of the nurses if you have concerns.  For further questions, please visit [email or web address]

## 2015-05-03 NOTE — Progress Notes (Signed)
Report called to [facility]. RN verbalized understanding of all discharge instructions. Pt has left the floor. Etta Quill, RN

## 2015-05-03 NOTE — Progress Notes (Signed)
CSW (Clinical Social Worker) prepared pt dc packet and placed with shadow chart. CSW arranged non-emergent ambulance transport. Pt, pt family, pt nurse, and facility informed. CSW signing off.  Sareena Odeh, LCSWA [phone]  

## 2015-05-07 ENCOUNTER — Telehealth: Payer: Self-pay | Admitting: *Deleted

## 2015-05-07 NOTE — Telephone Encounter (Signed)
Oncology Nurse Navigator Documentation  Oncology Nurse Navigator Flowsheets 05/07/2015  Referral date to RadOnc/MedOnc 04/24/2015  Navigator Encounter Type Introductory phone call  Called patient and confirmed he is aware of his appointment with Dr. Benay Spice tomorrow at 2 pm. Patient spoke with nurse at [facility] and they have it on the books.

## 2015-05-08 ENCOUNTER — Telehealth: Payer: Self-pay | Admitting: Oncology

## 2015-05-08 ENCOUNTER — Encounter: Payer: Self-pay | Admitting: Oncology

## 2015-05-08 ENCOUNTER — Ambulatory Visit (HOSPITAL_BASED_OUTPATIENT_CLINIC_OR_DEPARTMENT_OTHER): Payer: Medicare Other | Admitting: Oncology

## 2015-05-08 ENCOUNTER — Ambulatory Visit: Payer: Medicare Other

## 2015-05-08 VITALS — BP 137/55 | HR 83 | Temp 97.9°F | Resp 18 | Ht 74.0 in | Wt 206.4 lb

## 2015-05-08 DIAGNOSIS — D509 Iron deficiency anemia, unspecified: Secondary | ICD-10-CM

## 2015-05-08 DIAGNOSIS — C189 Malignant neoplasm of colon, unspecified: Secondary | ICD-10-CM

## 2015-05-08 DIAGNOSIS — C187 Malignant neoplasm of sigmoid colon: Secondary | ICD-10-CM

## 2015-05-08 DIAGNOSIS — C172 Malignant neoplasm of ileum: Secondary | ICD-10-CM

## 2015-05-08 NOTE — Progress Notes (Signed)
Checked in new pt with no financial concerns prior to seeing the dr.  Abbott Pao has Shauna's card for any billing questions, concerns or if financial assistance is needed.

## 2015-05-08 NOTE — Progress Notes (Signed)
Oncology Nurse Navigator Documentation  Oncology Nurse Navigator Flowsheets 05/08/2015  Referral date to RadOnc/MedOnc 04/24/2015  Navigator Encounter Type Initial MedOnc  Patient Visit Type Medonc  Treatment Phase Treatment planning  Barriers/Navigation Needs Transportation;Family concerns;Education  Education Contractor;Newly Diagnosed Cancer Education;Transport During Treatment  Interventions Referrals;Education Method  Referrals Social Work;Nutrition/dietician;ACS  Education Method Teach-back;Verbal;Written  Support Groups/Services GI  Time Spent with Patient 60  Met with patient and church friend, Darnelle Maffucci Little during new patient visit. Explained the role of the GI Nurse Navigator and provided New Patient Packet with information on: 1. Colorectal cancer 2. Support groups 3. Advanced Directives 4. Fall Safety Plan Answered questions, reviewed current treatment plan using TEACH back and provided emotional support. Provided copy of current treatment plan for patient and facility. Reviewed information MD provided with diagram and explanation. Agrees to allow RN to call his wife and review plans with her. Plans to return home from SNF in a week or so. Still having rehab at [facility]. Encouraged him to participate more in his ostomy care since he will be going home soon. Suggested he ask for [facility] upon discharge. Has some concern about transportation for some appointments-has not been released to drive yet. Sent application to [employer] for assistance/information. Will schedule his chemo class around his CT scan to reduce driving. He says his step grand daughter, Lynwood Dawley could come to chemo class with him. Will contact surgery to determine when he should follow up. Incision is open in a couple areas-needs to heal prior to starting chemotherapy. Referral made to Rich and dietician.  Merceda Elks, RN, BSN GI Oncology [place]

## 2015-05-08 NOTE — Progress Notes (Signed)
[facility] Patient Consult   Referring MD: Maki Sweetser 81 y.o.  [birth year]    Reason for Referral: Colon cancer   HPI: Mr. Baccam presented to the emergency room 04/19/2015 with a 2 day history of abdominal pain, nausea/vomiting, and constipation. A CT of the abdomen and pelvis revealed multiple low-density lesions in the liver felt to most likely represent cyst. A high-grade small bowel obstruction is caused by a mass at the distal ileum or ileocecal valve. Enlarged right lower quadrant mesenteric nodes were noted. No nodules at the lung bases.  Surgery was consulted and he was taken to the operating room by Dr. Donne Hazel on 04/20/2015 for a subtotal colectomy and in ileostomy. Dilated terminal ileum was noted to him to her a right colon mass. A desmoplastic reaction to the duodenum was noted. The sigmoid colon was attached to the mass. An en bloc resection was performed. There was not enough left colon remaining for a stoma. An ileostomy was created. No evidence of metastatic disease. Small hemangiomas were noted on the liver.  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 Tumor invaded through the serosa with involvement of adherent colon. 7 of 26 lymph nodes contained metastatic disease. Surgical margins were negative. Lymphovascular and perineural invasion is present. One tumor deposit was noted.   Following surgery he developed Enterobacter sepsis and a yeast urinary tract infection. He continues an outpatient course of antibiotics.  He also developed atrial fibrillation postoperatively.  He is now recovering in a rehabilitation facility. He is performing physical therapy implant to return home. He felt well up until a few days prior to hospital admission.  He reports a polyp was removed from the colon in 2016 while living in [facility]. He did not have a follow-up  colonoscopy.  Past Medical History  Diagnosis Date  . Hypertension   . PUD (peptic ulcer disease)     2006  . Depression   . Headache(784.0)   . Arthritis     general  . Alcohol abuse   . SBO (small bowel obstruction) 04/2015  .  colon cancer-cecum (T4,N2)   04/2015     .   Bleeding duodenal ulcer                                                                                  10/2012  Past Surgical History  Procedure Laterality Date  . Cataract extraction Right   . Esophagogastroduodenoscopy N/A 11/06/2012    Procedure: ESOPHAGOGASTRODUODENOSCOPY (EGD);  Surgeon: Lear Ng, MD;  Location: [facility] ENDOSCOPY;  Service: Endoscopy;  Laterality: N/A;  . Esophagogastroduodenoscopy N/A 11/07/2012    Procedure: ESOPHAGOGASTRODUODENOSCOPY (EGD);  Surgeon: Lear Ng, MD;  Location: [facility] ENDOSCOPY;  Service: Endoscopy;  Laterality: N/A;  . Laparotomy N/A 04/20/2015    Procedure: EXPLORATORY LAPAROTOMY WITH PARTIAL COLECTOMY;  Surgeon: Rolm Bookbinder, MD;  Location: [facility];  Service: General;  Laterality: N/A;  Exploratory lap with right colectomy, small bowel and sigmoid resection, ileostomy    Medications: Reviewed  Allergies: No Known Allergies  Family history: No family history of cancer. 7 siblings, 3 children  Social History:   He lives with his wife in [place]. He is retired from Contractor business. He quit smoking cigarettes in the 1960s. He has a history of heavy alcohol use up until the August 2016 hospital admission. He is a Restaurant manager, fast food. No risk factor for HIV or hepatitis.    ROS:   Positives include: Temp weight loss, nausea and vomiting prior to hospital admission, intermittent left abdomen pain-she relates this to straining a muscle while lifting  A complete ROS was otherwise negative.  Physical Exam:  Blood pressure 137/55, pulse 83, temperature 97.9 F (36.6 C), temperature source Oral, resp. rate 18, height $RemoveBe'6\' 2"'aDVemfYIG$  (1.88 m), weight 206 lb  6.4 oz (93.622 kg), SpO2 100 %.  HEENT: Upper and lower denture plate, oropharynx without visible mass, neck without mass Lungs: Clear bilaterally Cardiac: Regular rate and rhythm Abdomen: No hepatosplenomegaly, nontender, no mass, midline incision with several superficial open areas, right lower quadrant ileostomy GU: Testes without mass  Vascular: No leg edema Lymph nodes: No cervical, supraclavicular, axillary, or inguinal nodes Neurologic: Alert and oriented, the motor exam appears intact in the upper and lower extremities Skin: No rash Musculoskeletal: No spine tenderness   LAB:  CBC  Lab Results  Component Value Date   WBC 12.0* 05/03/2015   HGB 9.6* 05/03/2015   HCT 30.1* 05/03/2015   MCV 75.8* 05/03/2015   PLT 306 05/03/2015   NEUTROABS 18.0* 04/28/2015     CMP      Component Value Date/Time   NA 134* 05/03/2015 0646   K 4.3 05/03/2015 0646   CL 104 05/03/2015 0646   CO2 24 05/03/2015 0646   GLUCOSE 127* 05/03/2015 0646   BUN <5* 05/03/2015 0646   CREATININE 0.94 05/03/2015 0646   CALCIUM 8.9 05/03/2015 0646   PROT 5.0* 04/29/2015 0535   ALBUMIN 2.0* 04/29/2015 0535   AST 21 04/29/2015 0535   ALT 7* 04/29/2015 0535   ALKPHOS 76 04/29/2015 0535   BILITOT 0.4 04/29/2015 0535   GFRNONAA >60 05/03/2015 0646   GFRAA >60 05/03/2015 0646    Lab Results  Component Value Date   CEA 2.4 04/20/2015    Imaging: As per history of present illness, CT abdomen/pelvis 04/20/2015 reviewed   Assessment/Plan:   1. 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   Bleeding duodenal ulcer February 2014  3.   History of heavy alcohol use  4.   Enterobacter bacteremia following surgery August 2016  5.   Microcytic anemia secondary to #1   Disposition:   Mr. Martos has been diagnosed with stage III colon cancer. I discussed the details of  the surgical pathology report and prognosis with Mr. Rutigliano and a friend. He has a high chance of developing recurrent colon cancer over the next several years. We discussed data supporting a benefit from adjuvant 5-fluorouracil chemotherapy in this setting. I recommend adjuvant capecitabine. The addition of oxaliplatin has shown an additional benefit in younger patients. I do not recommend oxaliplatin in his case.  We reviewed the potential toxicities associated with capecitabine including the chance for mucositis, diarrhea, and hematologic toxicity. We discussed the sun sensitivity, rash, hyperpigmentation, and hand/foot syndrome associated with capecitabine. He will attend a chemotherapy teaching class. He agrees to proceed.  Mr. Knotts appears to have had a good performance status prior to presenting with the small bowel obstruction. He appears to be a  candidate for adjuvant chemotherapy. I cautioned him against the use of alcohol while completing the course of adjuvant chemotherapy.  He appears to have iron deficiency anemia. We will recommend he begin iron replacement therapy. He will be referred for a staging chest CT.  Mr. Wimberly will return for an office visit with the plan to begin adjuvant chemotherapy on 05/24/2015.  [place], [place] 05/08/2015, 3:18 PM

## 2015-05-08 NOTE — Telephone Encounter (Signed)
Gave and printed appt sched and avs for pt for Aug and sept °

## 2015-05-08 NOTE — Telephone Encounter (Signed)
s.w. pt and confirmed appts.....pt ok and aware °

## 2015-05-08 NOTE — Telephone Encounter (Signed)
Per 08/23 POF added Nut after MD visit....  KJ

## 2015-05-08 NOTE — Telephone Encounter (Signed)
Gave and printed appt sched and avs fo rpt for Aug and Sept °

## 2015-05-09 ENCOUNTER — Telehealth: Payer: Self-pay | Admitting: *Deleted

## 2015-05-09 ENCOUNTER — Other Ambulatory Visit: Payer: Self-pay | Admitting: *Deleted

## 2015-05-09 MED ORDER — CAPECITABINE 500 MG PO TABS: ORAL_TABLET | ORAL | Status: DC

## 2015-05-09 NOTE — Telephone Encounter (Signed)
Spoke with nurse, Hardie Pulley at [facility]) and confirmed the office note from 05/08/15 was received. She also got the orders for Ferrous sulfate 325 mg bid. Made her aware that we plan to start the oral chemo after he returns home when rehab is completed. Provided her with date/time for CT chest 05/15/15 at 3:15/3:30 and to be NPO 4 hours prior. She will pass this on to Mr. Hollabaugh. Left VM on Zade's phone [phone] the appointments. Attempted to reach his wife without success.

## 2015-05-10 ENCOUNTER — Encounter: Payer: Self-pay | Admitting: Oncology

## 2015-05-10 NOTE — Progress Notes (Signed)
Per pharmacy no asst available with colon cancer   - capectitabine. Copay now is 294.04

## 2015-05-11 ENCOUNTER — Encounter: Payer: Self-pay | Admitting: *Deleted

## 2015-05-11 NOTE — Progress Notes (Signed)
Co pay for Xeloda = $294.04 with no patient assistance for his diagnosis available at this time. Attempted to reach him to discuss-left VM. If he does not return call will discuss this with him on 05/15/15 when here for lab/chemo class.

## 2015-05-14 ENCOUNTER — Encounter: Payer: Self-pay | Admitting: *Deleted

## 2015-05-14 ENCOUNTER — Telehealth: Payer: Self-pay | Admitting: *Deleted

## 2015-05-14 NOTE — Telephone Encounter (Signed)
Call from church friend to confirm he has transportation tomorrow to his appointment. Made him aware what I can tell him is limited, since he is not on his release of information. Reassured him that if SCAT does not come through for him, we will provide a taxi here and back to facility.

## 2015-05-14 NOTE — Telephone Encounter (Signed)
1642: Message forwarded from triage. Called pt, he reports his issue has been handled by Manuela Schwartz.

## 2015-05-14 NOTE — Progress Notes (Signed)
[place] Work  Clinical Social Work spoke with contact at [facility] regarding transportation needs for patient.  CSW and [facility] contact discussed SCAT.  Max stated they plan to complete SCAT application today.  CSW contact information will be listed for future needs.  CSW encouraged [place] to call if they have any difficulty with application.  [place] will contact CSW once application has been submitted.    Johnnye Lana, MSW, LCSW, OSW-C Clinical Social Worker [facility] [phone]

## 2015-05-14 NOTE — Telephone Encounter (Signed)
Oncology Nurse Navigator Documentation  Oncology Nurse Navigator Flowsheets 05/14/2015  Referral date to RadOnc/MedOnc -  Navigator Encounter Type Telephone  Patient Visit Type -  Treatment Phase Treatment planning  Barriers/Navigation Needs Transportation  Education -  Interventions Transportation-spoke with CSW-will get taxi if SCAT does not go through   Referrals -  Education Method Reviewed CT prep and appointments for tomorrow with Marlowe Kays  Support Groups/Services -  Time Spent with Patient -

## 2015-05-14 NOTE — Telephone Encounter (Signed)
VOICE MAIL AT 12:30PM/ FORWARD AT 2:45PM UNABLE TO UNDERSTAND CALLER'S NAME. HE ASKED TO SPEAK TO SUSAN COWARD.

## 2015-05-15 ENCOUNTER — Other Ambulatory Visit: Payer: Medicare Other

## 2015-05-15 ENCOUNTER — Other Ambulatory Visit (HOSPITAL_BASED_OUTPATIENT_CLINIC_OR_DEPARTMENT_OTHER): Payer: Medicare Other

## 2015-05-15 ENCOUNTER — Ambulatory Visit (HOSPITAL_COMMUNITY)
Admission: RE | Admit: 2015-05-15 | Discharge: 2015-05-15 | Disposition: A | Discharge status: Home / Self Care | Payer: Medicare Other | Source: Ambulatory Visit | Attending: Oncology | Admitting: Oncology

## 2015-05-15 ENCOUNTER — Encounter: Payer: Self-pay | Admitting: *Deleted

## 2015-05-15 DIAGNOSIS — C189 Malignant neoplasm of colon, unspecified: Secondary | ICD-10-CM | POA: Insufficient documentation

## 2015-05-15 DIAGNOSIS — K769 Liver disease, unspecified: Secondary | ICD-10-CM | POA: Insufficient documentation

## 2015-05-15 DIAGNOSIS — D509 Iron deficiency anemia, unspecified: Secondary | ICD-10-CM

## 2015-05-15 DIAGNOSIS — I700 Atherosclerosis of aorta: Secondary | ICD-10-CM | POA: Diagnosis not present

## 2015-05-15 DIAGNOSIS — R222 Localized swelling, mass and lump, trunk: Secondary | ICD-10-CM | POA: Diagnosis not present

## 2015-05-15 DIAGNOSIS — Z08 Encounter for follow-up examination after completed treatment for malignant neoplasm: Secondary | ICD-10-CM | POA: Diagnosis not present

## 2015-05-15 LAB — CBC WITH DIFFERENTIAL/PLATELET
BASO%: 0.4 % (ref 0.0–2.0)
Basophils Absolute: 0 10*3/uL (ref 0.0–0.1)
EOS ABS: 0.2 10*3/uL (ref 0.0–0.5)
EOS%: 2.5 % (ref 0.0–7.0)
HCT: 33.9 % — ABNORMAL LOW (ref 38.4–49.9)
HEMOGLOBIN: 10.8 g/dL — AB (ref 13.0–17.1)
LYMPH#: 1.6 10*3/uL (ref 0.9–3.3)
LYMPH%: 22.9 % (ref 14.0–49.0)
MCH: 24.1 pg — ABNORMAL LOW (ref 27.2–33.4)
MCHC: 31.9 g/dL — ABNORMAL LOW (ref 32.0–36.0)
MCV: 75.7 fL — AB (ref 79.3–98.0)
MONO#: 0.5 10*3/uL (ref 0.1–0.9)
MONO%: 7.1 % (ref 0.0–14.0)
NEUT%: 67.1 % (ref 39.0–75.0)
NEUTROS ABS: 4.8 10*3/uL (ref 1.5–6.5)
PLATELETS: 201 10*3/uL (ref 140–400)
RBC: 4.48 10*6/uL (ref 4.20–5.82)
RDW: 17 % — AB (ref 11.0–14.6)
WBC: 7.1 10*3/uL (ref 4.0–10.3)

## 2015-05-15 LAB — COMPREHENSIVE METABOLIC PANEL (CC13)
ALBUMIN: 3.4 g/dL — AB (ref 3.5–5.0)
ALK PHOS: 65 U/L (ref 40–150)
ALT: 6 U/L (ref 0–55)
ANION GAP: 8 meq/L (ref 3–11)
AST: 15 U/L (ref 5–34)
BILIRUBIN TOTAL: 0.44 mg/dL (ref 0.20–1.20)
BUN: 8.1 mg/dL (ref 7.0–26.0)
CO2: 26 meq/L (ref 22–29)
CREATININE: 1.1 mg/dL (ref 0.7–1.3)
Calcium: 9.8 mg/dL (ref 8.4–10.4)
Chloride: 103 mEq/L (ref 98–109)
EGFR: 75 mL/min/{1.73_m2} — ABNORMAL LOW (ref >90)
GLUCOSE: 103 mg/dL (ref 70–140)
Potassium: 4.1 mEq/L (ref 3.5–5.1)
Sodium: 137 mEq/L (ref 136–145)
TOTAL PROTEIN: 7 g/dL (ref 6.4–8.3)

## 2015-05-16 ENCOUNTER — Encounter: Payer: Self-pay | Admitting: *Deleted

## 2015-05-16 LAB — FERRITIN CHCC: FERRITIN: 114 ng/mL (ref 22–316)

## 2015-05-16 NOTE — Progress Notes (Signed)
Patient signed consent for xeloda use on 05/15/2015.

## 2015-05-18 ENCOUNTER — Telehealth: Payer: Self-pay | Admitting: *Deleted

## 2015-05-18 NOTE — Telephone Encounter (Signed)
Message from Baxter Flattery asking if Dr. Benay Spice will be attending for [place]. Pt does not have PCP, will need a provider to sign orders. Pt is being discharged from skilled nursing today. Reviewed with Dr. Benay Spice.  MD will sign home health orders.

## 2015-05-23 ENCOUNTER — Telehealth: Payer: Self-pay | Admitting: *Deleted

## 2015-05-23 NOTE — Telephone Encounter (Signed)
  Oncology Nurse Navigator Documentation:  Patient left VM to confirm his appointment tomorrow at 10 am Called back and left VM that yes, his appointment is at 10 am tomorrow.

## 2015-05-24 ENCOUNTER — Telehealth: Payer: Self-pay | Admitting: Oncology

## 2015-05-24 ENCOUNTER — Encounter: Payer: Self-pay | Admitting: Oncology

## 2015-05-24 ENCOUNTER — Ambulatory Visit (HOSPITAL_BASED_OUTPATIENT_CLINIC_OR_DEPARTMENT_OTHER): Payer: Medicare Other | Admitting: Oncology

## 2015-05-24 ENCOUNTER — Ambulatory Visit: Payer: Medicare Other | Admitting: Nutrition

## 2015-05-24 ENCOUNTER — Telehealth: Payer: Self-pay | Admitting: *Deleted

## 2015-05-24 ENCOUNTER — Encounter: Payer: Self-pay | Admitting: *Deleted

## 2015-05-24 VITALS — BP 148/54 | HR 71 | Temp 97.7°F | Resp 18 | Ht 74.0 in | Wt 200.1 lb

## 2015-05-24 DIAGNOSIS — R222 Localized swelling, mass and lump, trunk: Secondary | ICD-10-CM

## 2015-05-24 DIAGNOSIS — K769 Liver disease, unspecified: Secondary | ICD-10-CM

## 2015-05-24 DIAGNOSIS — D508 Other iron deficiency anemias: Secondary | ICD-10-CM | POA: Diagnosis not present

## 2015-05-24 DIAGNOSIS — C187 Malignant neoplasm of sigmoid colon: Secondary | ICD-10-CM

## 2015-05-24 DIAGNOSIS — C172 Malignant neoplasm of ileum: Secondary | ICD-10-CM | POA: Diagnosis not present

## 2015-05-24 DIAGNOSIS — Z23 Encounter for immunization: Secondary | ICD-10-CM | POA: Diagnosis not present

## 2015-05-24 DIAGNOSIS — C189 Malignant neoplasm of colon, unspecified: Secondary | ICD-10-CM

## 2015-05-24 MED ORDER — INFLUENZA VAC SPLIT QUAD 0.5 ML IM SUSY
0.5000 mL | PREFILLED_SYRINGE | Freq: Once | INTRAMUSCULAR | Status: AC
  Administered 2015-05-24: 0.5 mL via INTRAMUSCULAR
  Filled 2015-05-24: qty 0.5

## 2015-05-24 NOTE — Progress Notes (Signed)
Oncology Nurse Navigator Documentation  Oncology Nurse Navigator Flowsheets 05/24/2015  Referral date to RadOnc/MedOnc -  Navigator Encounter Type 2 week F/U  Patient Visit Type Medonc  Treatment Phase Treatment planning  Barriers/Navigation Needs Family concerns;Education  Education Accessing Care/ Finding Providers; Symptom Management;Preparing for Upcoming Treatment  Interventions Education Method;Call to wound ostomy nurse  Referrals -  Education Method Teach-back;Written;Verbal  Support Groups/Services -  Specialty Items/DME [place] supplies  Time Spent with Patient 30  Confirmed at visit today that Harold Price has no prescription coverage-call [facility] to confirm he pays out of pocket for all his meds. He is not able to afford the co pay for Xeloda. MD changed his treatment to weekly 5FU/LV-reviewed potential side effects and management. Instructed to stop his MVI and folic acid. Take Imodium qid prn if he ostomy puts out more than his baseline. Current stool in bag is soft-empties when 1/2 full about 4/day. Reports he is getting short on his ostomy supplies and does not know how to order more. Also  Having some leakage around the ostomy that requires bag change every other day. Provided him with skin prep wipes to try and how to use them. Missed his follow up with Dr. Donne Hazel this week, so rescheduled for tomorrow at 11:00--patient agrees to go (still has open are on incision line).  Later found phone # for [name] that ostomy nurse said will accept Medicare payment [phone]. Left VM for patient to call back to get this information.

## 2015-05-24 NOTE — Progress Notes (Signed)
  [facility] OFFICE PROGRESS NOTE   Diagnosis: Colon cancer  INTERVAL HISTORY:   Mr. Knaus returns as scheduled. He has returned home. He drove himself to the office today. He empties the ileostomy approximate 4 times daily. He is drinking adequate fluids. There is leakage of stool from the ostomy bag on the abdominal skin.  Mr. Matranga cannot afford the Xeloda.  Objective:  Vital signs in last 24 hours:  Blood pressure 148/54, pulse 71, temperature 97.7 F (36.5 C), temperature source Oral, resp. rate 18, height $RemoveBe'6\' 2"'krUtixGIT$  (1.88 m), weight 200 lb 1.6 oz (90.765 kg), SpO2 100 %.    HEENT: The mucous membranes are moist Resp: Lungs clear bilaterally Cardio: Regular rate and rhythm GI: No hepatomegaly, right abdomen ileostomy with semi-formed stool, superficial opening at the upper and lower portion of the midline incision. Small amount of drainage overlying incision-appears to be coming from the medial side of the ostomy Vascular: No leg edema    Lab Results:  Lab Results  Component Value Date   WBC 7.1 05/15/2015   HGB 10.8* 05/15/2015   HCT 33.9* 05/15/2015   MCV 75.7* 05/15/2015   PLT 201 05/15/2015   NEUTROABS 4.8 05/15/2015      Lab Results  Component Value Date   CEA 2.4 04/20/2015     Medications: I have reviewed the patient's current medications.  Assessment/Plan: 1. 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 Bleeding duodenal ulcer February 2014  3. History of heavy alcohol use  4. Enterobacter bacteremia following surgery August 2016  5. Microcytic anemia secondary to #1  6.    CT chest 05/15/2015-negative for metastatic disease, fatty mass at the left chest wall-low-grade neoplasm not  excluded   Disposition:  Mr. Lamadrid appears well. The plan is to begin adjuvant systemic therapy. His case was presented at the GI tumor conference on 05/23/2015. One of the liver lesions is not clearly a cyst or hemangioma. An MRI is recommended. We will schedule this for within the next few weeks.  Mr. Eshelman cannot afford Xeloda. We decided to switch to 5-FU/leucovorin per the [facility] regimen. I reviewed the potential toxicities associated with this regimen and he agrees to proceed.  He will be scheduled for an office visit and first week of 5-FU/leucovorin 06/01/2015.  Betsy Coder, MD  05/24/2015  10:55 AM

## 2015-05-24 NOTE — Telephone Encounter (Signed)
per pof to sch pt appt-sent MW emailt o sch pt trmt * dc-adv pt will call after reply with completed sch

## 2015-05-24 NOTE — Telephone Encounter (Signed)
Late entry note for 05/23/15: received call from RN with [facility] (could not hear nurses name), stating pt refused [facility] services and pt is driving, making him no longer homebound status

## 2015-05-24 NOTE — Progress Notes (Signed)
81 year-old male diagnosed with colon cancer to receive 5 FU.  He is a patient of Dr. Benay Spice.  PMH includes HTN, PUD, Depression, ETOH, SBO s/p colectomy and ileostomy.  Medications include MMW, Folvite, Immodium, MVI, Protonix, Ultram, Thiamine, and metamucil.  Labs include Albumin 3.4 on August 30.  Height: 6 feet, 2 inches. Weight: 200.1 pounds on Sept 8. UBW: 247 pounds 2014. BMI: 25.68.  Patient recently discharged from [facility] care. He now lives at home. Ileostomy is healing well. Reports he eats 3 meals daily but doesn't usually snack. Consumes 48 oz. Water daily and about 24 oz liquids at meals. Ileostomy output acceptable per staff. Patient has had gradual weight loss over the past 2 years and has lost 10 pounds since August 18.  Nutrition Diagnosis:  Unintended weight loss related to inadequate oral intake as evidenced by 19% weight loss over 2 years and 5% weight loss over 2 weeks.  Intervention: Educated patient to increase liquids to 80 - 96 oz daily. Suggested patient increase water. Educated patient on importance of increasing calories and protein to minimize further weight loss.   Reviewed high calorie, high protein foods and provided a fact sheet. Recommended patient add oral nutrition supplements BID between meals and provided samples and coupons. Questions answered and teach back method used.  Monitoring, Evaluation, Goals:  Patient will increase calories and protein to minimize weight loss and increase hydration to approximately 96 oz. daily.  Next Visit:  To be scheduled.

## 2015-05-24 NOTE — Patient Instructions (Signed)
Stop taking the vitamin and folic acid (will interfere with chemo) Take Imodium 2 mg tablets--#2 four times day if needed for diarrhea See Dr. Donne Hazel 05/25/15 at 11:20---arrive at 11:10 (office on [facility])

## 2015-05-25 ENCOUNTER — Telehealth: Payer: Self-pay | Admitting: *Deleted

## 2015-05-25 NOTE — Telephone Encounter (Signed)
  Oncology Nurse Navigator Documentation    Navigator Encounter Type: Telephone (05/25/15 1212)  Left VM on home machine after receiving EPIC message from Dr. Donne Hazel that he was a "no show" again today. Requested return call to discuss his surgery appointment and information he needs for his supplies.

## 2015-05-25 NOTE — Telephone Encounter (Signed)
Per staff message and POF I have scheduled appts. Advised scheduler of appts. JMW  

## 2015-05-28 ENCOUNTER — Telehealth: Payer: Self-pay | Admitting: *Deleted

## 2015-05-28 NOTE — Telephone Encounter (Signed)
Lorena had tried to call Saturday and left message on VM asking for "triage nurse". Called him today, but had to leave VM to call back when he is available.

## 2015-05-28 NOTE — Telephone Encounter (Addendum)
VOICE MAIL AT 3:58PM/FORWARD AT 4:10PM- NEED ORDER FAXED.

## 2015-05-28 NOTE — Telephone Encounter (Signed)
Oncology Nurse Navigator Documentation  Oncology Nurse Navigator Flowsheets 05/28/2015  Referral date to RadOnc/MedOnc -  Navigator Encounter Type Telephone  Patient Visit Type -  Treatment Phase Treatment  Barriers/Navigation Needs Family concerns  Education Supplies/appointments  Interventions Contact info for [facility]; appointments for Friday   Referrals -  Education Method Verbal;Teach-back  Support Groups/Services -  Specialty Items/DME -  Time Spent with Patient 15

## 2015-05-30 ENCOUNTER — Telehealth: Payer: Self-pay | Admitting: Oncology

## 2015-05-30 NOTE — Telephone Encounter (Signed)
per reply from MW w/trmt sch-cld pt & left message to adv of appt /trmt time & date-

## 2015-06-01 ENCOUNTER — Encounter: Payer: Self-pay | Admitting: *Deleted

## 2015-06-01 ENCOUNTER — Ambulatory Visit: Payer: Medicare Other

## 2015-06-01 ENCOUNTER — Telehealth: Payer: Self-pay | Admitting: Nurse Practitioner

## 2015-06-01 ENCOUNTER — Ambulatory Visit (HOSPITAL_COMMUNITY)
Admission: RE | Admit: 2015-06-01 | Discharge: 2015-06-01 | Disposition: A | Discharge status: Home / Self Care | Payer: Medicare Other | Source: Ambulatory Visit | Attending: Oncology | Admitting: Oncology

## 2015-06-01 ENCOUNTER — Other Ambulatory Visit (HOSPITAL_BASED_OUTPATIENT_CLINIC_OR_DEPARTMENT_OTHER): Payer: Medicare Other

## 2015-06-01 ENCOUNTER — Ambulatory Visit (HOSPITAL_BASED_OUTPATIENT_CLINIC_OR_DEPARTMENT_OTHER): Payer: Medicare Other | Admitting: Nurse Practitioner

## 2015-06-01 VITALS — BP 150/65 | HR 88 | Temp 97.6°F | Resp 17 | Ht 74.0 in | Wt 192.6 lb

## 2015-06-01 DIAGNOSIS — C189 Malignant neoplasm of colon, unspecified: Secondary | ICD-10-CM | POA: Diagnosis not present

## 2015-06-01 DIAGNOSIS — Z23 Encounter for immunization: Secondary | ICD-10-CM

## 2015-06-01 DIAGNOSIS — C787 Secondary malignant neoplasm of liver and intrahepatic bile duct: Secondary | ICD-10-CM | POA: Insufficient documentation

## 2015-06-01 HISTORY — DX: Secondary malignant neoplasm of liver and intrahepatic bile duct: C78.7

## 2015-06-01 HISTORY — DX: Secondary malignant neoplasm of liver and intrahepatic bile duct: C18.9

## 2015-06-01 LAB — BASIC METABOLIC PANEL (CC13)
Anion Gap: 8 mEq/L (ref 3–11)
BUN: 13.6 mg/dL (ref 7.0–26.0)
CHLORIDE: 104 meq/L (ref 98–109)
CO2: 23 meq/L (ref 22–29)
CREATININE: 1 mg/dL (ref 0.7–1.3)
Calcium: 10.1 mg/dL (ref 8.4–10.4)
EGFR: 80 mL/min/{1.73_m2} — ABNORMAL LOW (ref >90)
Glucose: 107 mg/dl (ref 70–140)
Potassium: 4.5 mEq/L (ref 3.5–5.1)
Sodium: 136 mEq/L (ref 136–145)

## 2015-06-01 MED ORDER — GADOBENATE DIMEGLUMINE 529 MG/ML IV SOLN
20.0000 mL | Freq: Once | INTRAVENOUS | Status: AC | PRN
  Administered 2015-06-01: 20 mL via INTRAVENOUS

## 2015-06-01 NOTE — Progress Notes (Signed)
Oncology Nurse Navigator Documentation  Oncology Nurse Navigator Flowsheets 06/01/2015  Referral date to RadOnc/MedOnc -  Navigator Encounter Type 3 week F/U  Patient Visit Type Medonc  Treatment Phase Treatment planning  Barriers/Navigation Needs Family concerns;Education  Education Symptom Management/Colostomy Care  Interventions Referrals;Education Method  Referrals Home Health  Education Method Verbal;Teach-back  Support Groups/Services -  Specialty Items/DME [place] supplies-gave patient box of ostomy bags ([place] [phone])  Time Spent with Patient 30  Patient had leaking around his ostomy bag and and taped sides with medical tape. Removed bag, cleaned skin, applied Skin Prep and applied ostomy bag. Secured edges with pink tape. Explained process as patient watched. He agrees to home health nurse referral to help reinforce proper ostomy care. Called [facility] and left VM with referral information as well as EPIC referral placed. Harold Price confirms he has ordered his supplies and are supposed to be delivered this week. Instructed him to call Monday if he has not received his supplies yet. Reinforced the need to take Imodium #2 tablets qid prn to keep his stools more formed. Holding chemo until he is more stable.

## 2015-06-01 NOTE — Telephone Encounter (Signed)
S/w pt confirming MD visit added on for 09/23 per 09/16 POF, pt also states he left his bag that Loren Racer gave him, doesn't know if he left it on the cab he left in or on the bench outside of the [place]. I advised pt I would have Loren Racer call him concerning this matter.... KJ

## 2015-06-01 NOTE — Progress Notes (Signed)
  [place] OFFICE PROGRESS NOTE   Diagnosis:  Colon cancer  INTERVAL HISTORY:   Harold Price returns as scheduled. He reports watery output from the ostomy. He is emptying the ostomy bag 4-6 times a day. He is not taking Imodium. He is out of ostomy supplies. He is taping the bag to his skin. He denies nausea/vomiting. No mouth sores.  Objective:  Vital signs in last 24 hours:  Blood pressure 150/65, pulse 88, temperature 97.6 F (36.4 C), temperature source Oral, resp. rate 17, height $RemoveBe'6\' 2"'JElwECSJi$  (1.88 m), weight 192 lb 9.6 oz (87.363 kg), SpO2 100 %.    HEENT: No thrush or ulcers. Resp: Lungs clear bilaterally. Cardio: Regular rate and rhythm. GI: Abdomen is soft and nontender. No hepatomegaly. Ileostomy collection bag with watery output as well as some thicker stool. 1 cm fleshy lesion at the upper portion of the midline incision. The ostomy appears to have leaked with watery stool across the abdominal wall. Vascular: No leg edema.     Lab Results:  Lab Results  Component Value Date   WBC 7.1 05/15/2015   HGB 10.8* 05/15/2015   HCT 33.9* 05/15/2015   MCV 75.7* 05/15/2015   PLT 201 05/15/2015   NEUTROABS 4.8 05/15/2015    Imaging:  No results found.  Medications: I have reviewed the patient's current medications.  Assessment/Plan: 1. 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  2. Bleeding duodenal ulcer February 2014  3. History of heavy alcohol use  4. Enterobacter bacteremia following surgery August 2016  5. Microcytic anemia secondary to #1  6. CT chest 05/15/2015-negative for metastatic disease, fatty mass at the left chest wall-low-grade neoplasm not  excluded    Disposition: Harold Price is experiencing significant watery diarrhea. He has lost approximately 8 pounds over the past week. I am holding today's chemotherapy and rescheduling for one week.  He will begin Imodium to a maximum of 16 mg a day. He will push fluids by mouth. His ostomy appliance was changed in the office. He has ordered supplies for the ostomy. He will let us know if the supplies do not arrive by 06/04/2015. We are making a referral for home health as well.  The [place] social worker met with him today to discuss his current living situation as well as transportation issues.  We will see him back in one week prior to beginning chemotherapy. He will contact the office in the interim as outlined above or with any other problems.  25 minutes were spent face-to-face at today's visit with the majority of that time involved in counseling/coordination of care.    Ned Card ANP/GNP-BC   06/01/2015  11:59 AM

## 2015-06-01 NOTE — Progress Notes (Signed)
[place] Work  Clinical Social Work was referred by Marine scientist and Engineer, mining for assessment of psychosocial needs due to transportation issues.  Clinical Social Worker met with patient at [facility] to offer support and assess for needs. Pt recently d/c'd from [facility], lives at home with wife and stepdaughter. Had ride today, but ride had to leave to get daughter after school. Pt assisted with transportation home. Reports he has [employer] certification and can use for future rides, also aware of bus. CSW provided pt with ACS number for volunteer driver as well. CSW discussed case with medical team and [place] to be set up to further check on support at home. CSW also provided pt with change of clothes.  Pt agrees to CSW checking in next week.   Clinical Social Work interventions: Resource education and referral Pt advocacy  Loren Racer, [place] Worker [place]  [place] Phone: [phone] Fax: [phone]

## 2015-06-04 ENCOUNTER — Encounter (HOSPITAL_COMMUNITY): Payer: Self-pay | Admitting: Emergency Medicine

## 2015-06-04 ENCOUNTER — Emergency Department (HOSPITAL_COMMUNITY)
Admission: EM | Admit: 2015-06-04 | Discharge: 2015-06-04 | Disposition: A | Discharge status: Home / Self Care | Payer: Medicare Other | Attending: Emergency Medicine | Admitting: Emergency Medicine

## 2015-06-04 ENCOUNTER — Telehealth: Payer: Self-pay | Admitting: *Deleted

## 2015-06-04 DIAGNOSIS — Z8719 Personal history of other diseases of the digestive system: Secondary | ICD-10-CM | POA: Insufficient documentation

## 2015-06-04 DIAGNOSIS — Z859 Personal history of malignant neoplasm, unspecified: Secondary | ICD-10-CM | POA: Diagnosis not present

## 2015-06-04 DIAGNOSIS — M1990 Unspecified osteoarthritis, unspecified site: Secondary | ICD-10-CM | POA: Diagnosis not present

## 2015-06-04 DIAGNOSIS — R197 Diarrhea, unspecified: Secondary | ICD-10-CM

## 2015-06-04 DIAGNOSIS — Z8711 Personal history of peptic ulcer disease: Secondary | ICD-10-CM | POA: Insufficient documentation

## 2015-06-04 DIAGNOSIS — Z433 Encounter for attention to colostomy: Secondary | ICD-10-CM | POA: Diagnosis present

## 2015-06-04 DIAGNOSIS — I10 Essential (primary) hypertension: Secondary | ICD-10-CM | POA: Insufficient documentation

## 2015-06-04 DIAGNOSIS — F329 Major depressive disorder, single episode, unspecified: Secondary | ICD-10-CM | POA: Diagnosis not present

## 2015-06-04 DIAGNOSIS — C189 Malignant neoplasm of colon, unspecified: Secondary | ICD-10-CM

## 2015-06-04 DIAGNOSIS — Z79899 Other long term (current) drug therapy: Secondary | ICD-10-CM | POA: Insufficient documentation

## 2015-06-04 NOTE — Progress Notes (Signed)
CSW consulted, per EDP note for home health services. CSW spoke with nurse who is contacting rn cm to assist with home health needs. Pt is followed by [facility] csw.   Harold Price, [place] Work  [facility] [phone]

## 2015-06-04 NOTE — ED Notes (Signed)
Case management finishing up with patient

## 2015-06-04 NOTE — Telephone Encounter (Signed)
Received call from Maudie Mercury, ED Case Manager requesting we fax orders to [facility] for pt's ostomy supplies. Same done. (See note from Case Manager from earlier today.)

## 2015-06-04 NOTE — ED Provider Notes (Signed)
CSN: [id number]     Arrival date & time 06/04/15  0256 History   First MD Initiated Contact with Patient 06/04/15 0400     Chief Complaint  Patient presents with  . Post-op Problem     (Consider location/radiation/quality/duration/timing/severity/associated sxs/prior Treatment) HPI  This is an 81 year old male who underwent a colostomy last month. He is here complaining of running out of colostomy supplies and having to improvise by stuffing toilet paper around the ostomy bag where it has been leaking. Nursing staff reports the ostomy site had surrounding skin breakdown and stool leakage that it contaminated his midline incision. The colostomy bag was replaced and his incision site cleaned and redressed prior to MIBI relation. The patient denies pain.  Past Medical History  Diagnosis Date  . Hypertension   . PUD (peptic ulcer disease)     2006  . Depression   . Headache(784.0)   . Arthritis     general  . Alcohol abuse   . SBO (small bowel obstruction) 04/2015  . Transfusion of blood product refused for religious reason   . Cancer    Past Surgical History  Procedure Laterality Date  . Cataract extraction Right   . Esophagogastroduodenoscopy N/A 11/06/2012    Procedure: ESOPHAGOGASTRODUODENOSCOPY (EGD);  Surgeon: Lear Ng, MD;  [facility];  Service: Endoscopy;  Laterality: N/A;  . Esophagogastroduodenoscopy N/A 11/07/2012    Procedure: ESOPHAGOGASTRODUODENOSCOPY (EGD);  Surgeon: Lear Ng, MD;  Location: [facility] ENDOSCOPY;  Service: Endoscopy;  Laterality: N/A;  . Laparotomy N/A 04/20/2015    Procedure: EXPLORATORY LAPAROTOMY WITH PARTIAL COLECTOMY;  Surgeon: Rolm Bookbinder, MD;  Location: [place];  Service: General;  Laterality: N/A;  Exploratory lap with right colectomy, small bowel and sigmoid resection, ileostomy  . Colostomy     Family History  Problem Relation Age of Onset  . Ulcers Brother    Social History  Substance Use Topics  . Smoking  status: Never Smoker   . Smokeless tobacco: Never Used  . Alcohol Use: 16.8 oz/week    28 Cans of beer per week    Review of Systems  All other systems reviewed and are negative.   Allergies  Review of patient's allergies indicates no known allergies.  Home Medications   Prior to Admission medications   Medication Sig Start Date End Date Taking? Authorizing Provider  ibuprofen (ADVIL,MOTRIN) 200 MG tablet Take 400 mg by mouth every 6 (six) hours as needed for moderate pain.   Yes Historical Provider, MD  acetaminophen (TYLENOL) 325 MG tablet Take 2 tablets (650 mg total) by mouth every 6 (six) hours as needed for mild pain or moderate pain. Patient not taking: Reported on 06/04/2015 05/03/15   Geradine Girt, DO  calcium carbonate (TUMS - DOSED IN MG ELEMENTAL CALCIUM) 500 MG chewable tablet Chew 1 tablet by mouth as needed for heartburn.    Historical Provider, MD  chlorhexidine (PERIDEX) 0.12 % solution Use as directed 15 mLs in the mouth or throat 2 (two) times daily. Patient not taking: Reported on 06/04/2015 05/03/15   Geradine Girt, DO  HYDROcodone-acetaminophen (NORCO/VICODIN) 5-325 MG per tablet Take 1-2 tablets by mouth every 6 (six) hours as needed (for breakthrough only, use ultram and tylenol first!). Patient not taking: Reported on 06/04/2015 05/03/15   Geradine Girt, DO  levofloxacin (LEVAQUIN) 500 MG tablet Take 1 tablet (500 mg total) by mouth daily. Patient not taking: Reported on 06/04/2015 05/03/15   Geradine Girt, DO  loperamide (IMODIUM)  2 MG capsule Take 2 capsules (4 mg total) by mouth 4 (four) times daily. Patient taking differently: Take 4 mg by mouth 4 (four) times daily as needed.  05/03/15   Geradine Girt, DO  menthol-cetylpyridinium (CEPACOL) 3 MG lozenge Take 1 lozenge (3 mg total) by mouth as needed for sore throat. Patient not taking: Reported on 06/04/2015 05/03/15   Geradine Girt, DO  methocarbamol (ROBAXIN) 750 MG tablet Take 1 tablet (750 mg total) by  mouth every 8 (eight) hours as needed for muscle spasms. Patient not taking: Reported on 06/04/2015 05/03/15   Geradine Girt, DO  pantoprazole (PROTONIX) 40 MG tablet Take 1 tablet (40 mg total) by mouth daily. Patient not taking: Reported on 06/04/2015 05/03/15   Tomi Bamberger Vann, DO  psyllium (HYDROCIL/METAMUCIL) 95 % PACK Take 1 packet by mouth 2 (two) times daily. Patient not taking: Reported on 06/04/2015 05/03/15   Geradine Girt, DO  saccharomyces boulardii (FLORASTOR) 250 MG capsule Take 1 capsule (250 mg total) by mouth 2 (two) times daily. Patient not taking: Reported on 06/04/2015 05/03/15   Geradine Girt, DO  thiamine 100 MG tablet Take 1 tablet (100 mg total) by mouth daily. Patient not taking: Reported on 06/04/2015 05/03/15   Geradine Girt, DO  traMADol (ULTRAM) 50 MG tablet Take 1 tablet (50 mg total) by mouth every 6 (six) hours as needed for moderate pain or severe pain. Patient not taking: Reported on 06/04/2015 05/03/15   Tomi Bamberger Vann, DO   BP 164/78 mmHg  Pulse 75  Temp(Src) 97.7 F (36.5 C) (Oral)  Resp 18  SpO2 100%   Physical Exam  General: Well-developed, well-nourished male in no acute distress; appearance consistent with age of record HENT: normocephalic; atraumatic Eyes: pupils equal, round and reactive to light; extraocular muscles intact; arcus senilis bilaterally Neck: supple Heart: regular rate and rhythm Lungs: clear to auscultation bilaterally Abdomen: soft; nondistended; nontender; bowel sounds present; well healing midline incision without signs of infection; colostomy bag in right lower quadrant draining greenish liquid stool Extremities: No deformity; full range of motion; pulses normal Neurologic: Awake, alert; motor function intact in all extremities and symmetric; no facial droop Skin: Warm and dry; chronic appearing thickened, dry skin of lower legs Psychiatric: Normal mood and affect    ED Course  Procedures (including critical care time)   MDM   Will have social work consulted later this morning to see about getting patient home health assistance.    Shanon Rosser, MD 06/04/15 [phone]

## 2015-06-04 NOTE — ED Notes (Signed)
Pt presents via EMS c/o leaking colostomy x 3 days.  The colostomy was placed on Friday 9/17 after removal of malignant tumor and has been leaking ever since.  Due to start chemo next week.  A/O x 4, ambulatory unassisted. No other complaints.

## 2015-06-04 NOTE — Progress Notes (Addendum)
Pt given a written copy of this plus 4 pouches and ring barriers  Follow-up With Details Why Contact Info  [facility] Call on 06/04/2015 As needed  [facility] has been called and will send supplies to you via [place] within 1-2 days as soon as Dr Benay Spice fax one last order sheet Tonya at Dr Benay Spice is aware of this and will complete this today 1 [phone]   [place] Call on 06/04/2015 As needed [facility] for [facility] to start service on Tuesday PLEASE call them back if you receive a call from them [address] [place] [address] [phone]   [name] Call on 06/04/2015 You ar still active with Dr Elon Alas for a family doctor at [facility] but prior to being seen again you DO need to call the billing office [address] [place] [address] [phone]   [facility] Go on 06/08/2015 10:15 am  [address] [id number] [place]    [facility] Go on 06/15/2015 [place] am  [address] [id number] [place]   [facility] On 06/22/2015 10:30 AM  [address] [id number] [employer] [phone]

## 2015-06-04 NOTE — ED Notes (Signed)
Case manager contacted and is aware of patient situation. Patient will be seen shortly.

## 2015-06-04 NOTE — Consult Note (Signed)
WOC ostomy follow up Contacted by Case Manager in Ed to assist patient with the obtaining of emergent supplies for ostomy. Patient known to me from previous admission.  One piece pouching system with skin barrier ring is requested and these same supplies are provided-enough for two weeks (with twice weekly changes). One piece drainable pouch Kellie Simmering [phone])  with skin barrier ring Toy Cookey [phone]). [place] nursing team will not follow, but will remain available to this patient, the nursing and medical teams.  Please re-consult if needed. Thanks, Maudie Flakes, MSN, RN, [place], [place], [place] [phone])

## 2015-06-04 NOTE — Discharge Instructions (Signed)
Colostomy Home Guide °A colostomy is an opening for stool to leave your body when a medical condition prevents it from leaving through the usual opening (rectum). During a surgery, a piece of large intestine (colon) is brought through a hole in the abdominal wall. The new opening is called a stoma or ostomy. A bag or pouch fits over the stoma to catch stool and gas. Your stool may be liquid, somewhat pasty, or formed. °CARING FOR YOUR STOMA  °Normally, the stoma looks a lot like the inside of your cheek: pink, red, and moist. At first it may be swollen, but this swelling will decrease within 6 weeks. °Keep the skin around your stoma clean and dry. You can gently wash your stoma and the skin around your stoma in the shower with a clean, soft washcloth. If you develop any skin irritation, your caregiver may give you a stoma powder or ointment to help heal the area. Do not use any products other than those specifically given to you by your caregiver.  °Your stoma should not be uncomfortable. If you notice any stinging or burning, your pouch may be leaking, and the skin around your stoma may be coming into contact with stool. This can cause skin irritation. If you notice stinging, replace your pouch with a new one and discard the old one. °OSTOMY POUCHES  °The pouch that fits over the ostomy can be made up of either 1 or 2 pieces. A one-piece pouch has a skin barrier piece and the pouch itself in one unit. A two-piece pouch has a skin barrier with a separate pouch that snaps on and off of the skin barrier. Either way, you should empty the pouch when it is only  to ½ full. Do not let more stool or gas build up. This could cause the pouch to leak. °Some ostomy bags have a built-in gas release valve. Ostomy deodorizer (5 drops) can be put into the pouch to prevent odor. Some people use ostomy lubricant drops inside the pouch to help the stool slide out of the bag more easily and completely.  °EMPTYING YOUR OSTOMY POUCH    °You may get lessons on how to empty your pouch from a wound-ostomy nurse before you leave the hospital. Here are the basic steps: °· Wash your hands with soap and water. °· Sit far back on the toilet. °· Put several pieces of toilet paper into the toilet water. This will prevent splashing as you empty the stool into the toilet bowl. °· Unclip or unvelcro the tail end of the pouch. °· Unroll the tail and empty stool into the toilet. °· Clean the tail with toilet paper. °· Reroll the tail, and clip or velcro it closed. °· Wash your hands again. °CHANGING YOUR OSTOMY POUCH  °Change your ostomy pouch about every 3 to 4 days for the first 6 weeks, then every 5 to7 days. Always change the bag sooner if there is any leakage or you begin to notice any discomfort or irritation of the skin around the stoma. When possible, plan to change your ostomy pouch before eating or drinking as this will lessen the chance of stool coming out during the pouch change. A wound-ostomy nurse may teach you how to change your pouch before you leave the hospital. Here are the basic steps: °· Lay out your supplies. °· Wash your hands with soap and water. °· Carefully remove the old pouch. °· Wash the stoma and allow it to dry. Men may be   advised to shave any hair around the stoma very carefully. This will make the adhesive stick better. °· Use the stoma measuring guide that comes with your pouch set to decide what size hole you will need to cut in the skin barrier piece. Choose the smallest possible size that will hold the stoma but will not touch it. °· Use the guide to trace the circle on the back of the skin barrier piece. Cut out the hole. °· Hold the skin barrier piece over the stoma to make sure the hole is the correct size. °· Remove the adhesive paper backing from the skin barrier piece. °· Squeeze stoma paste around the opening of the skin barrier piece. °· Clean and dry the skin around the stoma again. °· Carefully fit the skin  barrier piece over your stoma. °· If you are using a two-piece pouch, snap the pouch onto the skin barrier piece. °· Close the tail of the pouch. °· Put your hand over the top of the skin barrier piece to help warm it for about 5 minutes, so that it conforms to your body better. °· Wash your hands again. °DIET TIPS  °· Continue to follow your usual diet. °· Drink about eight 8 oz glasses of water each day. °· You can prevent gas by eating slowly and chewing your food thoroughly. °· If you feel concerned that you have too much gas, you can cut back on gas-producing foods, such as: °¨ Spicy foods. °¨ Onions and garlic. °¨ Cruciferous vegetables (cabbage, broccoli, cauliflower, Brussels sprouts). °¨ Beans and legumes. °¨ Some cheeses. °¨ Eggs. °¨ Fish. °¨ Bubbly (carbonated) drinks. °¨ Chewing gum. °GENERAL TIPS  °· You can shower with or without the bag in place. °· Always keep the bag on if you are bathing or swimming. °· If your bag gets wet, you can dry it with a blow-dryer set to cool. °· Avoid wearing tight clothing directly over your stoma so that it does not become irritated or bleed. Tight clothing can also prevent stool from draining into the pouch. °· It is helpful to always have an extra skin barrier and pouch with you when traveling. Do not leave them anywhere too warm, as parts of them can melt. °· Do not let your seat belt rest on your stoma. Try to keep the seat belt either above or below your stoma, or use a tiny pillow to cushion it. °· You can still participate in sports, but you should avoid activities in which there is a risk of getting hit in the abdomen. °· You can still have sex. It is a good idea to empty your pouch prior to sex. Some people and their partners feel very comfortable seeing the pouch during sex. Others choose to wear lingerie or a T-shirt that covers the device. °SEEK IMMEDIATE MEDICAL CARE IF: °· You notice a change in the size or color of the stoma, especially if it becomes  very red, purple, black, or pale white. °· You have bloody stools or bleeding from the stoma. °· You have abdominal pain, nausea, vomiting, or bloating. °· There is anything unusual protruding from the stoma. °· You have irritation or red skin around the stoma. °· No stool is passing from the stoma. °· You have diarrhea (requiring more frequent than normal pouch emptying). °Document Released: 09/04/2003 Document Revised: 11/24/2011 Document Reviewed: 01/29/2011 °ExitCare® Patient Information ©2015 [employer]. This information is not intended to replace advice given to you by your health care   provider. Make sure you discuss any questions you have with your health care provider. ° °

## 2015-06-04 NOTE — ED Notes (Addendum)
Pt had a recently created ostomy. Pt does not currently have home health. Pt's leaking ostomy bag was the reason for today's visit. Pt's ostomy bag was changed. His surgical incision was filled with stool. This was cleaned and bandaged.

## 2015-06-04 NOTE — ED Notes (Signed)
Bed: RN16 Expected date:  Expected time:  Means of arrival:  Comments: EMS 81 yo male leaking colostomy bag

## 2015-06-04 NOTE — ED Provider Notes (Signed)
Patient has been seen by case manager and she has arranged further supplies in home health care. Patient is stable for discharge.  Sherwood Gambler, MD 06/04/15 [phone]

## 2015-06-04 NOTE — Progress Notes (Addendum)
[place] spoke with Margarita Grizzle Wound care Rn to get assist with getting pt 4 pouches and 4 rings for [facility] until [facility] supply received Pt presently has on a bag and ring 2 piece set hollister [phone] mm 1 1/2 in  [place] recommends [employer] be called to get [phone] pouches and [phone] rings  CM spoke with Chrissie Noa and Merrily Pew in materials ED Charge updated 1132 Cm confirmed pt pcp as [name] at [facility] at [place] [address] Phone: [phone] Confirmed pt still is an active pt but will need to speak with the billing office prior to any other appts or services from this office 1120 Cm left a message for [facility] care explaining pt with correct home number and address but difficulty retrieving voice message and hearing his phone  1055 Cm spoke with Kenney Houseman Dr Benay Spice RN to see if the rx for supplies can be faxed to [facility] immediately   Levada Dy from [facility] is requesting a written Rx with  2 boxes of drainable pouches product # [id number] HPT # [phone] 2 boxes of barrier rings Item # [id number] HPT # [phone]  To be faxed to [phone] ATTN acct # [id number] URGENT  Tonya to get above information placed on a Rx to be faxed to [facility]  Pt updated    1040 CM called [facility] at [phone] Placed this number for pt in d/c instructions because pt voice message from [facility] representative was unclear for he and CM  Cm spoke Levada Dy at [facility] who provided what is needed to complete this order for pt Report no response form Dr Benay Spice at this time States pt supplies can be fed ex and received in 1-2 days after Rx for supplies is received to fax [phone] Has called Dr Benay Spice 13. 62, & for rx and no response Placed orde ron 12  1020 CM spoke with Cyril Mourning of [facility] to find out pt was contacted x 4 along with calls to his grand daughter without response Cm confirmed with pt that he did receive calls Cm and pt listened to his calls  via a cell he has with him in [facility] ED. Calls from [facility] were received on pt voice message.  Cm to assist with pt returning calls Pt confirmed his address and home number in EPIC is correct Pt confirms he is aware of his upcoming eye appt/surgery coming in October with Dr Wynetta Emery and he has set up [facility] transport for a few of  His appts and received confirmation calls   [address] [facility] ED CM received call from ED Charge RN, Chesley Noon about the pt needing home health services  ED CM reviewed EPIC notes to find the a referral was [facility] to [facility] on 06/[facility]/15  CM left a voice message for [facility] coordinator staff Cm reviewed with ED charge RN what Cm found in EPIC noted Cm to assess pt for a correct address and contact numbers to make a connection with the home health agency

## 2015-06-05 ENCOUNTER — Telehealth: Payer: Self-pay | Admitting: *Deleted

## 2015-06-05 NOTE — Telephone Encounter (Signed)
  Oncology Nurse Navigator Documentation    Navigator Encounter Type: Telephone (06/05/15 1657): Left VM asking Harold Price to call back and let nurse know if the Home Health nurse got to his home yet and if he has ostomy supplies. Call office tomorrow.

## 2015-06-05 NOTE — Telephone Encounter (Signed)
Call received from [facility].  "Per insurance guidelines, we need a verbal order for confirmation that Dr. Vashti Hey be the ordering physician for mutual patient's ostomy supplies.  We then can fax the order for signature.  Dr. Benay Spice can sign for a year or a lifetime of supplies."  Deny receipt of faxed order on yesterday.  Return number [phone].

## 2015-06-05 NOTE — Telephone Encounter (Signed)
Called [facility] with verbal confirmation that Dr. Benay Spice will be the signing provider for ostomy supplies.  "Harold Price will fax orders to [phone] and ship an order out to the patient as well."  Asked if patient has home health services.  This nurse does not know if any home health is in use at this time.Marland Kitchen

## 2015-06-07 ENCOUNTER — Telehealth: Payer: Self-pay | Admitting: *Deleted

## 2015-06-07 NOTE — Telephone Encounter (Signed)
PT. ALSO MENTIONED HAVING DIARRHEA. LEFT PT. A VOICE MAIL TO KEEP HIS APPOINTMENTS FOR LAB AND SEE DR.SHERRILL TOMORROW. REQUESTED A RETURN CALL TO DISCUSS PT.'S PROBLEM WITH DIARRHEA.

## 2015-06-07 NOTE — Telephone Encounter (Signed)
Patient called at 3:41 pm asking when he is to come in for tomorrow's appointment.  Lab at 10:15 followed by MD visit and first treatment.  He ended call.  Called back to assess diarrhea further.  "I just got Kaopectate from [employer] on [address] will this work?  Today I've had three to four watery stools.  I take two imodium every four hours x twenty four doses.  It slowed down once but didn't last long.  Diarrhea for two weeks.  I haven't been able to start chemo yet.  I'm drinking but everything is coming through me."  Denies need for emergency visit.  Reviewed diet and he is eating beans, fried foods, juices, milk.  Reviewed [employer] Diet guidelines and foods to avoid.  Says he will start this tonight.  Will notify Dr. Benay Spice for any further orders for tomorrow.

## 2015-06-08 ENCOUNTER — Other Ambulatory Visit (HOSPITAL_BASED_OUTPATIENT_CLINIC_OR_DEPARTMENT_OTHER): Payer: Medicare Other

## 2015-06-08 ENCOUNTER — Ambulatory Visit (HOSPITAL_BASED_OUTPATIENT_CLINIC_OR_DEPARTMENT_OTHER): Payer: Medicare Other | Admitting: Oncology

## 2015-06-08 ENCOUNTER — Telehealth: Payer: Self-pay | Admitting: Oncology

## 2015-06-08 ENCOUNTER — Ambulatory Visit: Payer: Medicare Other

## 2015-06-08 ENCOUNTER — Other Ambulatory Visit: Payer: Self-pay | Admitting: *Deleted

## 2015-06-08 VITALS — BP 146/63 | HR 65 | Temp 97.6°F | Resp 18 | Ht 74.0 in | Wt 193.8 lb

## 2015-06-08 DIAGNOSIS — Z23 Encounter for immunization: Secondary | ICD-10-CM

## 2015-06-08 DIAGNOSIS — C189 Malignant neoplasm of colon, unspecified: Secondary | ICD-10-CM

## 2015-06-08 DIAGNOSIS — Z5111 Encounter for antineoplastic chemotherapy: Secondary | ICD-10-CM | POA: Diagnosis present

## 2015-06-08 LAB — CBC WITH DIFFERENTIAL/PLATELET
BASO%: 0.5 % (ref 0.0–2.0)
BASOS ABS: 0 10*3/uL (ref 0.0–0.1)
EOS ABS: 0.1 10*3/uL (ref 0.0–0.5)
EOS%: 2.2 % (ref 0.0–7.0)
HCT: 35.2 % — ABNORMAL LOW (ref 38.4–49.9)
HGB: 10.9 g/dL — ABNORMAL LOW (ref 13.0–17.1)
LYMPH%: 22.4 % (ref 14.0–49.0)
MCH: 23.8 pg — AB (ref 27.2–33.4)
MCHC: 31.1 g/dL — AB (ref 32.0–36.0)
MCV: 76.6 fL — AB (ref 79.3–98.0)
MONO#: 0.4 10*3/uL (ref 0.1–0.9)
MONO%: 6.2 % (ref 0.0–14.0)
NEUT#: 4.5 10*3/uL (ref 1.5–6.5)
NEUT%: 68.7 % (ref 39.0–75.0)
Platelets: 187 10*3/uL (ref 140–400)
RBC: 4.6 10*6/uL (ref 4.20–5.82)
RDW: 17.6 % — ABNORMAL HIGH (ref 11.0–14.6)
WBC: 6.5 10*3/uL (ref 4.0–10.3)
lymph#: 1.5 10*3/uL (ref 0.9–3.3)

## 2015-06-08 LAB — BASIC METABOLIC PANEL (CC13)
ANION GAP: 8 meq/L (ref 3–11)
BUN: 15.8 mg/dL (ref 7.0–26.0)
CALCIUM: 9.4 mg/dL (ref 8.4–10.4)
CO2: 22 meq/L (ref 22–29)
CREATININE: 0.9 mg/dL (ref 0.7–1.3)
Chloride: 105 mEq/L (ref 98–109)
Glucose: 95 mg/dl (ref 70–140)
Potassium: 4.2 mEq/L (ref 3.5–5.1)
SODIUM: 135 meq/L — AB (ref 136–145)

## 2015-06-08 MED ORDER — DIPHENOXYLATE-ATROPINE 2.5-0.025 MG PO TABS: 2.0000 | ORAL_TABLET | Freq: Three times a day (TID) | ORAL | Status: DC | PRN

## 2015-06-08 NOTE — Telephone Encounter (Signed)
returned call and lvm for pt confirming todays appt °

## 2015-06-08 NOTE — Progress Notes (Signed)
  Harold Price   Diagnosis: Colon cancer  INTERVAL HISTORY:   Harold Price returns as scheduled. He was seen in the emergency room 06/04/2015 with skin breakdown and leakage at the ostomy site. He now has a home health nurse and reports the ileostomy is functioning better. He empties the ileostomy approximate 4 times per day. The stool is "watery ". Imodium has not helped, but Kaopectate made the stool for her. The midline wound continues to heal.  Objective:  Vital signs in last 24 hours:  Blood pressure 146/63, pulse 65, temperature 97.6 F (36.4 C), temperature source Oral, resp. rate 18, height _0  (1.88 m), weight 193 lb 12.8 oz (87.907 kg), SpO2 100 %.    HEENT: The mucous membranes are moist Resp: Lungs clear bilaterally Cardio: Regular rate and rhythm GI: No hepatomegaly, right lower quadrant ileostomy with liquid stool and a small amount of formed stool Vascular: No leg edema  Skin: Superficial openings at the upper aspect of the midline wound. No surrounding erythema.     Lab Results:  Lab Results  Component Value Date   WBC 6.5 06/08/2015   HGB 10.9* 06/08/2015   HCT 35.2* 06/08/2015   MCV 76.6* 06/08/2015   PLT 187 06/08/2015   NEUTROABS 4.5 06/08/2015   Potassium 4.2, creatinine 0.9   Lab Results  Component Value Date   CEA 2.4 04/20/2015    Imaging:  MRI liver 06/01/2015-reviewed  Medications: I have reviewed the patient's current medications.  Assessment/Plan: 1. 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 Bleeding duodenal ulcer February 2014  3. History of heavy alcohol  use  4. Enterobacter bacteremia following surgery August 2016  5. Microcytic anemia secondary to #1  6. CT chest 05/15/2015-negative for metastatic disease, fatty mass at the left chest wall-low-grade neoplasm not excluded    Disposition:  Harold Price continues to have liquid output from the ileostomy. He will take Imodium 3 times per day scheduled. If this does not decrease the stool volume he will add 3 times per day Lomotil.  I discussed the MRI findings with Harold Price. He has metastatic colon cancer. No therapy will be curative. We will consider 5-FU/Avastin or FOLFOX/Avastin. He cannot afford capecitabine.  Harold Price will not be a candidate for chemotherapy until the wound heals and the ileostomy output has improved.  He will return for an office visit in one week.  Betsy Coder, MD  06/08/2015  11:40 AM

## 2015-06-13 ENCOUNTER — Telehealth: Payer: Self-pay | Admitting: Oncology

## 2015-06-13 NOTE — Telephone Encounter (Signed)
returned call adn s.w. pt and confirmed appt...pt ok and aware °

## 2015-06-15 ENCOUNTER — Ambulatory Visit (HOSPITAL_BASED_OUTPATIENT_CLINIC_OR_DEPARTMENT_OTHER): Payer: Medicare Other | Admitting: Oncology

## 2015-06-15 ENCOUNTER — Ambulatory Visit: Payer: Medicare Other

## 2015-06-15 ENCOUNTER — Telehealth: Payer: Self-pay | Admitting: *Deleted

## 2015-06-15 ENCOUNTER — Other Ambulatory Visit (HOSPITAL_BASED_OUTPATIENT_CLINIC_OR_DEPARTMENT_OTHER): Payer: Medicare Other

## 2015-06-15 ENCOUNTER — Telehealth: Payer: Self-pay | Admitting: Oncology

## 2015-06-15 ENCOUNTER — Other Ambulatory Visit: Payer: Self-pay | Admitting: General Surgery

## 2015-06-15 ENCOUNTER — Encounter: Payer: Medicare Other | Admitting: *Deleted

## 2015-06-15 VITALS — BP 131/53 | HR 58 | Temp 97.8°F | Resp 18 | Ht 74.0 in | Wt 196.9 lb

## 2015-06-15 DIAGNOSIS — C189 Malignant neoplasm of colon, unspecified: Secondary | ICD-10-CM

## 2015-06-15 DIAGNOSIS — Z23 Encounter for immunization: Secondary | ICD-10-CM

## 2015-06-15 LAB — CBC WITH DIFFERENTIAL/PLATELET
BASO%: 0.7 % (ref 0.0–2.0)
BASOS ABS: 0 10*3/uL (ref 0.0–0.1)
EOS ABS: 0.1 10*3/uL (ref 0.0–0.5)
EOS%: 2.5 % (ref 0.0–7.0)
HEMATOCRIT: 35.4 % — AB (ref 38.4–49.9)
HEMOGLOBIN: 11 g/dL — AB (ref 13.0–17.1)
LYMPH#: 1.3 10*3/uL (ref 0.9–3.3)
LYMPH%: 22 % (ref 14.0–49.0)
MCH: 23.6 pg — AB (ref 27.2–33.4)
MCHC: 31.2 g/dL — ABNORMAL LOW (ref 32.0–36.0)
MCV: 75.6 fL — ABNORMAL LOW (ref 79.3–98.0)
MONO#: 0.4 10*3/uL (ref 0.1–0.9)
MONO%: 6.9 % (ref 0.0–14.0)
NEUT%: 67.9 % (ref 39.0–75.0)
NEUTROS ABS: 4 10*3/uL (ref 1.5–6.5)
Platelets: 160 10*3/uL (ref 140–400)
RBC: 4.69 10*6/uL (ref 4.20–5.82)
RDW: 17.6 % — AB (ref 11.0–14.6)
WBC: 5.8 10*3/uL (ref 4.0–10.3)

## 2015-06-15 LAB — BASIC METABOLIC PANEL (CC13)
Anion Gap: 6 mEq/L (ref 3–11)
BUN: 17.2 mg/dL (ref 7.0–26.0)
CALCIUM: 9.2 mg/dL (ref 8.4–10.4)
CHLORIDE: 107 meq/L (ref 98–109)
CO2: 21 meq/L — AB (ref 22–29)
Creatinine: 0.9 mg/dL (ref 0.7–1.3)
GLUCOSE: 101 mg/dL (ref 70–140)
POTASSIUM: 4.4 meq/L (ref 3.5–5.1)
Sodium: 134 mEq/L — ABNORMAL LOW (ref 136–145)

## 2015-06-15 LAB — MAGNESIUM (CC13): Magnesium: 2.1 mg/dl (ref 1.5–2.5)

## 2015-06-15 MED ORDER — PROCHLORPERAZINE MALEATE 10 MG PO TABS: 10.0000 mg | ORAL_TABLET | Freq: Four times a day (QID) | ORAL | Status: AC | PRN

## 2015-06-15 MED ORDER — LIDOCAINE-PRILOCAINE 2.5-2.5 % EX CREA: 1.0000 "application " | TOPICAL_CREAM | CUTANEOUS | Status: AC | PRN

## 2015-06-15 NOTE — Telephone Encounter (Signed)
Per staff message and POF I have scheduled appts. Advised scheduler of appts. JMW  

## 2015-06-15 NOTE — Progress Notes (Signed)
Oncology Nurse Navigator Documentation  Oncology Nurse Navigator Flowsheets 06/15/2015  Referral date to RadOnc/MedOnc -  Navigator Encounter Type 1 month F/U  Patient Visit Type Medonc  Treatment Phase Treatment planning  Barriers/Navigation Needs Family concerns;Education  Education Concerns with Finances/ Eligibility;Transport During Treatment;Understanding Cancer/ Treatment Options;Pain/ Symptom Management  Interventions Education Method;EPIC message to surgeon for [facility] by 10/11  Referrals financial counselling  Education Method Verbal;Written;Teach-back  Support Groups/Services -  Specialty Items/DME -  Time Spent with Patient 45  Reviewed Imodium directions again: Take #2 tablets three times daily. If stool is not thick as it is today, then continue this and take Lomotil prn. Drink at least 64 ounces of non-caffinated beverage daily. Reviewed primary side effects and management of the Oxaliplatin and 5FU. Explained regimen and wrote information down for him. Provided model of PAC and how it is used and use of EMLA cream. Has no prescription insurance: instructed him to bring proof of income to next appointment to qualify for our $400 grant to use at pharmacy. Emptied his ostomy while here-was independent, but took him over 20 minutes to complete the task. Suggested he always have extra ostomy bag with him when he leaves home. Verbalizes agreement and understanding.

## 2015-06-15 NOTE — Progress Notes (Signed)
  [place] OFFICE PROGRESS NOTE   Diagnosis: Colon cancer  INTERVAL HISTORY:   Harold Price returns as scheduled. He reports the ileostomy apparatus is functioning well. The upper aspect of the abdominal wound remains open. He empties he ileostomy bag approximate 5 times per day. He has been taking Imodium and Lomotil. No new complaint.  Objective:  Vital signs in last 24 hours:  Blood pressure 131/53, pulse 58, temperature 97.8 F (36.6 C), temperature source Oral, resp. rate 18, height $RemoveBe'6\' 2"'pobaeesNw$  (1.88 m), weight 196 lb 14.4 oz (89.313 kg), SpO2 100 %.    Resp: Lungs clear bilaterally Cardio: Regular rate and rhythm GI: No hepatomegaly, right lower quadrant ileostomy with formed stool Vascular: No leg edema  Skin: Superficial opening at the upper aspect of the midline wound, no surrounding erythema     Lab Results:  Lab Results  Component Value Date   WBC 5.8 06/15/2015   HGB 11.0* 06/15/2015   HCT 35.4* 06/15/2015   MCV 75.6* 06/15/2015   PLT 160 06/15/2015   NEUTROABS 4.0 06/15/2015     Lab Results  Component Value Date   CEA 2.4 04/20/2015     Medications: I have reviewed the patient's current medications.  Assessment/Plan: 1. 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 Bleeding duodenal ulcer February 2014  3. History of heavy alcohol use  4. Enterobacter bacteremia following surgery August 2016  5. Microcytic anemia secondary to #1  6. CT chest 05/15/2015-negative for metastatic disease, fatty mass at the left chest wall-low-grade neoplasm not excluded   Disposition:  Harold Price has an improved  performance status. The ileostomy output is now partially formed area he will continue Imodium and as needed Lomotil. The midline wound has almost completely healed.  Harold Price has metastatic colon cancer. We discussed treatment options. He understands the expected response rate with single agent 5-fluorouracil is low. I do not recommend Avastin until the wound has completely healed and he is further out from the colon resection.  We discussed observation versus FOLFOX. I reviewed the potential toxicities associated with the FOLFOX regimen including the chance for nausea/vomiting, mucositis, diarrhea, and hematologic toxicity. We discussed the hand/foot syndrome and hyperpigmentation associated with 5 fluorouracil. We reviewed the various types of neuropathy seen with oxaliplatin. Harold Price would like to proceed with a trial of FOLFOX. We will refer him to Dr. Donne Hazel for placement of a Port-A-Cath.  A first cycle of FOLFOX has been scheduled for 06/26/2015. He will be seen for an office visit that day.  Betsy Coder, MD  06/15/2015  2:21 PM

## 2015-06-15 NOTE — Telephone Encounter (Addendum)
Pt confirmed labs/ov per 09/28 POF, gave pt AVS and Calendar... KJ, sent msg to add chemo °

## 2015-06-18 ENCOUNTER — Encounter (HOSPITAL_BASED_OUTPATIENT_CLINIC_OR_DEPARTMENT_OTHER): Payer: Self-pay | Admitting: *Deleted

## 2015-06-18 ENCOUNTER — Other Ambulatory Visit: Payer: Self-pay | Admitting: General Surgery

## 2015-06-18 ENCOUNTER — Telehealth: Payer: Self-pay | Admitting: *Deleted

## 2015-06-18 NOTE — Telephone Encounter (Signed)
Left VM reminding Harold Price of his appointment today with Dr. Donne Hazel at 11:00. Very important to keep this appointment to look at his ileostomy and incision and schedule for port.

## 2015-06-19 ENCOUNTER — Telehealth: Payer: Self-pay | Admitting: *Deleted

## 2015-06-19 NOTE — Telephone Encounter (Signed)
Harold Price has not yet picked up his EMLA cream or Compazine according to pharmacy. Cost for both combined with his Medicare D plan = $25.84(patient is not aware he has a drug plan). Called and left VM for him to pick up his meds this week and that cost will be $26.00

## 2015-06-20 ENCOUNTER — Ambulatory Visit (HOSPITAL_BASED_OUTPATIENT_CLINIC_OR_DEPARTMENT_OTHER)
Admission: RE | Admit: 2015-06-20 | Discharge: 2015-06-20 | Disposition: A | Discharge status: Home / Self Care | Payer: Medicare Other | Source: Ambulatory Visit | Attending: General Surgery | Admitting: General Surgery

## 2015-06-20 ENCOUNTER — Encounter (HOSPITAL_BASED_OUTPATIENT_CLINIC_OR_DEPARTMENT_OTHER)
Admission: RE | Disposition: A | Discharge status: Home / Self Care | Payer: Self-pay | Source: Ambulatory Visit | Attending: General Surgery

## 2015-06-20 ENCOUNTER — Ambulatory Visit (HOSPITAL_COMMUNITY): Payer: Medicare Other

## 2015-06-20 ENCOUNTER — Telehealth: Payer: Self-pay | Admitting: *Deleted

## 2015-06-20 ENCOUNTER — Ambulatory Visit (HOSPITAL_BASED_OUTPATIENT_CLINIC_OR_DEPARTMENT_OTHER): Payer: Medicare Other | Admitting: Certified Registered"

## 2015-06-20 ENCOUNTER — Encounter (HOSPITAL_BASED_OUTPATIENT_CLINIC_OR_DEPARTMENT_OTHER): Payer: Self-pay | Admitting: Certified Registered"

## 2015-06-20 DIAGNOSIS — Z95828 Presence of other vascular implants and grafts: Secondary | ICD-10-CM

## 2015-06-20 DIAGNOSIS — I10 Essential (primary) hypertension: Secondary | ICD-10-CM | POA: Diagnosis not present

## 2015-06-20 DIAGNOSIS — C189 Malignant neoplasm of colon, unspecified: Secondary | ICD-10-CM | POA: Insufficient documentation

## 2015-06-20 HISTORY — PX: PORTACATH PLACEMENT: SHX2246

## 2015-06-20 SURGERY — INSERTION, TUNNELED CENTRAL VENOUS DEVICE, WITH PORT
Anesthesia: General | Site: Chest | Laterality: Right

## 2015-06-20 MED ORDER — DEXAMETHASONE SODIUM PHOSPHATE 4 MG/ML IJ SOLN
INTRAMUSCULAR | Status: DC | PRN
  Administered 2015-06-20: 4 mg via INTRAVENOUS

## 2015-06-20 MED ORDER — HYDROCODONE-ACETAMINOPHEN 5-325 MG PO TABS: 1.0000 | ORAL_TABLET | Freq: Four times a day (QID) | ORAL | Status: DC | PRN

## 2015-06-20 MED ORDER — ONDANSETRON HCL 4 MG/2ML IJ SOLN
INTRAMUSCULAR | Status: AC
  Filled 2015-06-20: qty 2

## 2015-06-20 MED ORDER — ONDANSETRON HCL 4 MG/2ML IJ SOLN
INTRAMUSCULAR | Status: DC | PRN
  Administered 2015-06-20: 4 mg via INTRAVENOUS

## 2015-06-20 MED ORDER — DEXAMETHASONE SODIUM PHOSPHATE 10 MG/ML IJ SOLN
INTRAMUSCULAR | Status: AC
  Filled 2015-06-20: qty 1

## 2015-06-20 MED ORDER — MEPERIDINE HCL 25 MG/ML IJ SOLN: 6.2500 mg | INTRAMUSCULAR | Status: DC | PRN

## 2015-06-20 MED ORDER — HEPARIN SOD (PORK) LOCK FLUSH 100 UNIT/ML IV SOLN
INTRAVENOUS | Status: DC | PRN
  Administered 2015-06-20: 500 [IU]

## 2015-06-20 MED ORDER — FENTANYL CITRATE (PF) 100 MCG/2ML IJ SOLN
INTRAMUSCULAR | Status: DC | PRN
  Administered 2015-06-20: 50 ug via INTRAVENOUS

## 2015-06-20 MED ORDER — BUPIVACAINE HCL (PF) 0.25 % IJ SOLN
INTRAMUSCULAR | Status: DC | PRN
  Administered 2015-06-20: 5 mL

## 2015-06-20 MED ORDER — PROPOFOL 10 MG/ML IV BOLUS
INTRAVENOUS | Status: DC | PRN
  Administered 2015-06-20: 120 mg via INTRAVENOUS
  Administered 2015-06-20: 50 mg via INTRAVENOUS

## 2015-06-20 MED ORDER — CEFAZOLIN SODIUM-DEXTROSE 2-3 GM-% IV SOLR: 2.0000 g | INTRAVENOUS | Status: DC

## 2015-06-20 MED ORDER — CEFAZOLIN SODIUM-DEXTROSE 2-3 GM-% IV SOLR
INTRAVENOUS | Status: AC
  Filled 2015-06-20: qty 50

## 2015-06-20 MED ORDER — FENTANYL CITRATE (PF) 100 MCG/2ML IJ SOLN
INTRAMUSCULAR | Status: AC
  Filled 2015-06-20: qty 4

## 2015-06-20 MED ORDER — CEFAZOLIN SODIUM-DEXTROSE 2-3 GM-% IV SOLR
2.0000 g | INTRAVENOUS | Status: AC
  Administered 2015-06-20: 2 g via INTRAVENOUS

## 2015-06-20 MED ORDER — LIDOCAINE HCL (CARDIAC) 20 MG/ML IV SOLN
INTRAVENOUS | Status: DC | PRN
  Administered 2015-06-20: 40 mg via INTRAVENOUS

## 2015-06-20 MED ORDER — FENTANYL CITRATE (PF) 100 MCG/2ML IJ SOLN: 25.0000 ug | INTRAMUSCULAR | Status: DC | PRN

## 2015-06-20 MED ORDER — EPHEDRINE SULFATE 50 MG/ML IJ SOLN
INTRAMUSCULAR | Status: DC | PRN
  Administered 2015-06-20: 10 mg via INTRAVENOUS

## 2015-06-20 MED ORDER — SODIUM CHLORIDE 0.9 % IV SOLN
INTRAVENOUS | Status: DC | PRN
  Administered 2015-06-20: 50 mL

## 2015-06-20 MED ORDER — LACTATED RINGERS IV SOLN
INTRAVENOUS | Status: DC
Start: 2015-06-20 — End: 2015-06-20
  Administered 2015-06-20 (×2): via INTRAVENOUS

## 2015-06-20 SURGICAL SUPPLY — 51 items
BAG DECANTER FOR FLEXI CONT (MISCELLANEOUS) ×3 IMPLANT
BENZOIN TINCTURE PRP APPL 2/3 (GAUZE/BANDAGES/DRESSINGS) ×3 IMPLANT
BLADE SURG 11 STRL SS (BLADE) ×3 IMPLANT
BLADE SURG 15 STRL LF DISP TIS (BLADE) ×1 IMPLANT
BLADE SURG 15 STRL SS (BLADE) ×2
CANISTER SUCT 1200ML W/VALVE (MISCELLANEOUS) IMPLANT
CHLORAPREP W/TINT 26ML (MISCELLANEOUS) ×3 IMPLANT
CLOSURE WOUND 1/2 X4 (GAUZE/BANDAGES/DRESSINGS)
COVER BACK TABLE 60X90IN (DRAPES) ×3 IMPLANT
COVER MAYO STAND STRL (DRAPES) ×3 IMPLANT
DECANTER SPIKE VIAL GLASS SM (MISCELLANEOUS) ×3 IMPLANT
DRAPE C-ARM 42X72 X-RAY (DRAPES) ×3 IMPLANT
DRAPE LAPAROSCOPIC ABDOMINAL (DRAPES) ×3 IMPLANT
DRSG TEGADERM 4X4.75 (GAUZE/BANDAGES/DRESSINGS) IMPLANT
ELECT COATED BLADE 2.86 ST (ELECTRODE) ×3 IMPLANT
ELECT REM PT RETURN 9FT ADLT (ELECTROSURGICAL) ×3
ELECTRODE REM PT RTRN 9FT ADLT (ELECTROSURGICAL) ×1 IMPLANT
GLOVE BIO SURGEON STRL SZ7 (GLOVE) ×3 IMPLANT
GLOVE BIOGEL PI IND STRL 7.0 (GLOVE) ×1 IMPLANT
GLOVE BIOGEL PI IND STRL 7.5 (GLOVE) ×1 IMPLANT
GLOVE BIOGEL PI INDICATOR 7.0 (GLOVE) ×2
GLOVE BIOGEL PI INDICATOR 7.5 (GLOVE) ×2
GLOVE ECLIPSE 6.5 STRL STRAW (GLOVE) ×3 IMPLANT
GOWN STRL REUS W/ TWL LRG LVL3 (GOWN DISPOSABLE) ×4 IMPLANT
GOWN STRL REUS W/TWL LRG LVL3 (GOWN DISPOSABLE) ×8
IV KIT MINILOC 20X1 SAFETY (NEEDLE) IMPLANT
KIT PORT POWER 8FR ISP CVUE (Catheter) ×3 IMPLANT
LIQUID BAND (GAUZE/BANDAGES/DRESSINGS) ×3 IMPLANT
MARKER SKIN DUAL TIP RULER LAB (MISCELLANEOUS) ×3 IMPLANT
NDL SAFETY ECLIPSE 18X1.5 (NEEDLE) IMPLANT
NEEDLE HYPO 18GX1.5 SHARP (NEEDLE)
NEEDLE HYPO 25X1 1.5 SAFETY (NEEDLE) ×3 IMPLANT
PACK BASIN DAY SURGERY FS (CUSTOM PROCEDURE TRAY) ×3 IMPLANT
PENCIL BUTTON HOLSTER BLD 10FT (ELECTRODE) ×3 IMPLANT
SLEEVE SCD COMPRESS KNEE MED (MISCELLANEOUS) ×3 IMPLANT
SPONGE GAUZE 4X4 12PLY STER LF (GAUZE/BANDAGES/DRESSINGS) IMPLANT
STAPLER VISISTAT 35W (STAPLE) ×3 IMPLANT
STRIP CLOSURE SKIN 1/2X4 (GAUZE/BANDAGES/DRESSINGS) IMPLANT
SUT MON AB 4-0 PC3 18 (SUTURE) ×3 IMPLANT
SUT PROLENE 2 0 SH DA (SUTURE) ×3 IMPLANT
SUT SILK 2 0 TIES 17X18 (SUTURE)
SUT SILK 2-0 18XBRD TIE BLK (SUTURE) IMPLANT
SUT VIC AB 3-0 SH 27 (SUTURE) ×2
SUT VIC AB 3-0 SH 27X BRD (SUTURE) ×1 IMPLANT
SYR 5ML LUER SLIP (SYRINGE) ×3 IMPLANT
SYR CONTROL 10ML LL (SYRINGE) ×3 IMPLANT
TOWEL OR 17X24 6PK STRL BLUE (TOWEL DISPOSABLE) ×3 IMPLANT
TOWEL OR NON WOVEN STRL DISP B (DISPOSABLE) ×3 IMPLANT
TUBE CONNECTING 20'X1/4 (TUBING)
TUBE CONNECTING 20X1/4 (TUBING) IMPLANT
YANKAUER SUCT BULB TIP NO VENT (SUCTIONS) IMPLANT

## 2015-06-20 NOTE — Op Note (Signed)
Preoperative diagnosis: colon cancer Postoperative diagnosis: same as above Procedure: right ij powerport insertion Surgeon: Dr Serita Grammes EBL: minimal Anes: general  Specimens none Complications none Drains none Sponge count correct Dispo to pacu stable  Indications: This is a 12 yom I know from urgent removal of locally advanced colon cancer.  We discussed port placement prior to her beginning chemotherapy after meeting with medical oncology.  Procedure: After informed consent was obtained the patient was taken to the operating room. He was given antibiotics. Sequential compression devices were on his legs. He was then placed under general anesthesia with an LMA. The he was then prepped and draped in the standard sterile surgical fashion. Surgical timeout was then performed.  I used the ultrasound to identify the right internal jugular vein. I then accessed the vein using the ultrasound. This aspirated blood. I then placed the wire.I had to try this a second time due to the fact that his vein was so flat. This was confirmed by fluoroscopy to be in the correct position. I then made a pocket below her clavicle on the right side. I tunneled the line between the 2 sites. I then dilated the tract and placed the dilator assembly with the sheath. This was done under fluoroscopy. I then removed the sheath and dilator. The wire was also removed. The line was then pulled back to be in the distal cava.  I hooked this up to the port. I sutured this into place with 2-0 Prolene in 2 places. This aspirated blood and flushed easily. I placed heparin in the port. This was all confirmed with a final fluoroscopy. I then closed this with 2-0 Vicryl and 4-0 Monocryl. Dermabond was placed on both the sides. He tolerated this well and was transferred to the recovery room in stable condition.

## 2015-06-20 NOTE — Telephone Encounter (Addendum)
Call received from patient's niece of [place], [place].  "Calling to inform us to schedule [employer] transportation for all of Mr, Kittelson appointments."  Provided [employer]. A. T reservation number [phone].   "The doctor's do not want hin to drive.  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  What is he supposed to do?  He called me this morning to get him to the appointment today at [facility] for port-a-cath removal.  The surgeon said Dr. Gearldine Shown office is responsible for arranging transportation."  Provided dates and times for 06-26-2015 appointment.  Advised she use today's after visit summary to ensure correct times requested for the rest of October.

## 2015-06-20 NOTE — H&P (Signed)
  75 yom s/p colectomy with en bloc small bowel resection for colon cancer. he has stage iv disease. He has missed multiple postop appts. He is now doing pretty well. ileostomy with higher output but better. no issues with appliance he states today. he has two areas on wound concerned about. Dr Benay Spice will start adjuvant chemo as he didnt tolerated xeloda.    Other Problems Davy Pique Bynum, CMA; 06/18/2015 10:40 AM) Arthritis Back Pain Gastric Ulcer High blood pressure Pulmonary Embolism / Blood Clot in Legs  Past Surgical History Marjean Donna, CMA; 06/18/2015 10:40 AM) Colon Polyp Removal - Colonoscopy Colon Polyp Removal - Open Resection of Small Bowel Resection of Stomach  Diagnostic Studies History Marjean Donna, CMA; 06/18/2015 10:40 AM) Colonoscopy within last year  Allergies Davy Pique Bynum, [place]; 06/18/2015 10:41 AM) No Known Drug Allergies10/11/2014  Medication History (Sonya Bynum, CMA; 06/18/2015 10:42 AM) Diphenoxylate-Atropine (2.5-0.025MG  Tablet, Oral) Active. Chlorhexidine Gluconate (0.12% Solution, Mouth/Throat as needed) Active. Lomotil (2.5-0.025MG  Tablet, Oral) Active. Imodium (2MG  Capsule, Oral) Active. Medications Reconciled  Social History Marjean Donna, CMA; 06/18/2015 10:40 AM) Alcohol use Moderate alcohol use. Caffeine use Coffee. No drug use Tobacco use Former smoker.  Family History Marjean Donna, CMA; 06/18/2015 10:40 AM) Alcohol Abuse Brother, Sister. Arthritis Father. Diabetes Mellitus Sister. Hypertension Father.  Review of Systems ([place]; 06/18/2015 10:40 AM) General Present- Weight Loss. Not Present- Appetite Loss, Chills, Fatigue, Fever, Night Sweats and Weight Gain. Skin Present- Dryness and Non-Healing Wounds. Not Present- Change in Wart/Mole, Hives, Jaundice, New Lesions, Rash and Ulcer. HEENT Present- Wears glasses/contact lenses. Not Present- Earache, Hearing Loss, Hoarseness, Nose Bleed, Oral Ulcers, Ringing in the  Ears, Seasonal Allergies, Sinus Pain, Sore Throat, Visual Disturbances and Yellow Eyes. Respiratory Present- Chronic Cough. Not Present- Bloody sputum, Difficulty Breathing, Snoring and Wheezing. Cardiovascular Present- Swelling of Extremities. Not Present- Chest Pain, Difficulty Breathing Lying Down, Leg Cramps, Palpitations, Rapid Heart Rate and Shortness of Breath. Gastrointestinal Present- Change in Bowel Habits and Chronic diarrhea. Not Present- Abdominal Pain, Bloating, Bloody Stool, Constipation, Difficulty Swallowing, Excessive gas, Gets full quickly at meals, Hemorrhoids, Indigestion, Nausea, Rectal Pain and Vomiting.   Vitals (Sonya Bynum CMA; 06/18/2015 10:41 AM) 06/18/2015 10:41 AM Weight: 196 lb Height: 72in Body Surface Area: 2.13 m Body Mass Index: 26.58 kg/m Temp.: 47F(Temporal)  Pulse: 67 (Regular)  BP: 128/77 (Sitting, Left Arm, Standard)    Physical Exam Rolm Bookbinder MD; 06/18/2015 10:52 AM) General Mental Status-Alert. Orientation-Oriented X3.  Chest and Lung Exam Chest and lung exam reveals -on auscultation, normal breath sounds, no adventitious sounds and normal vocal resonance.  Cardiovascular Cardiovascular examination reveals -normal heart sounds, regular rate and rhythm with no murmurs.  Abdomen Note: midline healed with two areas of hypertrophic tissue, soft stoma pink and functional     Assessment & Plan Rolm Bookbinder MD; 06/18/2015 10:54 AM) POSTOPERATIVE STATE [id number]) Story: I applied silver nitrate to two areas. may need to do again. can start chemo whenever med onc ready. I increased immodium also. COLON CANCER (C18.9) Story: discussed port placement this week with risks/benefits

## 2015-06-20 NOTE — Anesthesia Preprocedure Evaluation (Signed)
Anesthesia Evaluation    Airway Mallampati: I  TM Distance: >3 FB Neck ROM: Full    Dental  (+) Dental Advisory Given, Edentulous Lower, Edentulous Upper   Pulmonary    breath sounds clear to auscultation       Cardiovascular hypertension, Pt. on medications  Rhythm:Regular Rate:Normal     Neuro/Psych    GI/Hepatic PUD,   Endo/Other    Renal/GU      Musculoskeletal   Abdominal   Peds  Hematology   Anesthesia Other Findings   Reproductive/Obstetrics                             Anesthesia Physical Anesthesia Plan  ASA: II  Anesthesia Plan: General   Post-op Pain Management:    Induction: Intravenous  Airway Management Planned: LMA  Additional Equipment:   Intra-op Plan:   Post-operative Plan: Extubation in OR  Informed Consent: I have reviewed the patients History and Physical, chart, labs and discussed the procedure including the risks, benefits and alternatives for the proposed anesthesia with the patient or authorized representative who has indicated his/her understanding and acceptance.   Dental advisory given  Plan Discussed with: CRNA, Anesthesiologist and Surgeon  Anesthesia Plan Comments:         Anesthesia Quick Evaluation

## 2015-06-20 NOTE — Anesthesia Procedure Notes (Signed)
Procedure Name: LMA Insertion Date/Time: 06/20/2015 2:20 PM Performed by: Baxter Flattery Pre-anesthesia Checklist: Patient identified, Emergency Drugs available, Suction available, Patient being monitored and Timeout performed Patient Re-evaluated:Patient Re-evaluated prior to inductionOxygen Delivery Method: Circle system utilized Preoxygenation: Pre-oxygenation with 100% oxygen Intubation Type: IV induction Ventilation: Mask ventilation without difficulty LMA: LMA inserted LMA Size: 5.0 Number of attempts: 1 Placement Confirmation: breath sounds checked- equal and bilateral and positive ETCO2 Tube secured with: Tape Dental Injury: Teeth and Oropharynx as per pre-operative assessment

## 2015-06-20 NOTE — Discharge Instructions (Signed)

## 2015-06-20 NOTE — Interval H&P Note (Signed)
History and Physical Interval Note:  06/20/2015 1:51 PM  Harold Price  has presented today for surgery, with the diagnosis of COLON CANCER  The various methods of treatment have been discussed with the patient and family. After consideration of risks, benefits and other options for treatment, the patient has consented to  Procedure(s): INSERTION PORT-A-CATH WITH ULTRASOUND (N/A) as a surgical intervention .  The patient's history has been reviewed, patient examined, no change in status, stable for surgery.  I have reviewed the patient's chart and labs.  Questions were answered to the patient's satisfaction.     Emanuell Morina

## 2015-06-20 NOTE — Anesthesia Postprocedure Evaluation (Signed)
  Anesthesia Post-op Note  Patient: Harold Price  Procedure(s) Performed: Procedure(s): INSERTION PORT-A-CATH WITH ULTRASOUND (Right)  Patient Location: PACU  Anesthesia Type: General   Level of Consciousness: awake, alert  and oriented  Airway and Oxygen Therapy: Patient Spontanous Breathing  Post-op Pain: mild  Post-op Assessment: Post-op Vital signs reviewed  Post-op Vital Signs: Reviewed  Last Vitals:  Filed Vitals:   06/20/15 1515  BP: 133/58  Pulse: 68  Temp:   Resp: 12    Complications: No apparent anesthesia complications

## 2015-06-20 NOTE — Transfer of Care (Signed)
Immediate Anesthesia Transfer of Care Note  Patient: Harold Price  Procedure(s) Performed: Procedure(s): INSERTION PORT-A-CATH WITH ULTRASOUND (Right)  Patient Location: PACU  Anesthesia Type:General  Level of Consciousness: awake, alert  and oriented  Airway & Oxygen Therapy: Patient Spontanous Breathing and Patient connected to face mask oxygen  Post-op Assessment: Report given to RN, Post -op Vital signs reviewed and stable and Patient moving all extremities  Post vital signs: Reviewed and stable  Last Vitals:  Filed Vitals:   06/20/15 1514  BP:   Pulse: 67  Temp:   Resp: 11    Complications: No apparent anesthesia complications

## 2015-06-21 ENCOUNTER — Encounter (HOSPITAL_BASED_OUTPATIENT_CLINIC_OR_DEPARTMENT_OTHER): Payer: Self-pay | Admitting: General Surgery

## 2015-06-22 ENCOUNTER — Other Ambulatory Visit: Payer: Medicare Other

## 2015-06-22 ENCOUNTER — Ambulatory Visit: Payer: Medicare Other

## 2015-06-24 ENCOUNTER — Other Ambulatory Visit: Payer: Self-pay | Admitting: Oncology

## 2015-06-25 NOTE — Telephone Encounter (Addendum)
Called niece Danae Chen to ensure everything worked out for transportation arrangements.  Confirmed.   Called Mr. Burggraf who says he needs to talk with her because [employer] came by one day he didn't need them.  This nurse called [employer] reservation to learn Mr. Sheils will be picked up tomorrow as early as 8:15 am and as late as 8:45 am.  For Pump D/C appointment there is always a change in time possibility.  Per [employer], 24 hour pick up notice required. Explained to patient pick up instructions and to apply cream to port-a-cath.  "I have not picked up anything from the pharmacy.  I will have [facility] drop me off at the pharmacy and walk to my appointment."  Denies any voice mails but also doesn't know how to retrieve messages.    This nurse will notify staff.  Advised patient we will ice port and create answer to this situation tomorrow.  Appopintment comments added.

## 2015-06-26 ENCOUNTER — Telehealth: Payer: Self-pay | Admitting: Oncology

## 2015-06-26 ENCOUNTER — Other Ambulatory Visit (HOSPITAL_BASED_OUTPATIENT_CLINIC_OR_DEPARTMENT_OTHER): Payer: Medicare Other

## 2015-06-26 ENCOUNTER — Ambulatory Visit: Payer: Medicare Other

## 2015-06-26 ENCOUNTER — Ambulatory Visit (HOSPITAL_BASED_OUTPATIENT_CLINIC_OR_DEPARTMENT_OTHER): Payer: Medicare Other

## 2015-06-26 ENCOUNTER — Other Ambulatory Visit: Payer: Self-pay | Admitting: Oncology

## 2015-06-26 ENCOUNTER — Ambulatory Visit (HOSPITAL_BASED_OUTPATIENT_CLINIC_OR_DEPARTMENT_OTHER): Payer: Medicare Other | Admitting: Nurse Practitioner

## 2015-06-26 VITALS — BP 145/61 | HR 58 | Temp 97.7°F | Resp 18 | Ht 74.0 in | Wt 203.1 lb

## 2015-06-26 DIAGNOSIS — C189 Malignant neoplasm of colon, unspecified: Secondary | ICD-10-CM

## 2015-06-26 DIAGNOSIS — C187 Malignant neoplasm of sigmoid colon: Secondary | ICD-10-CM

## 2015-06-26 DIAGNOSIS — D509 Iron deficiency anemia, unspecified: Secondary | ICD-10-CM | POA: Diagnosis not present

## 2015-06-26 DIAGNOSIS — Z95828 Presence of other vascular implants and grafts: Secondary | ICD-10-CM

## 2015-06-26 DIAGNOSIS — C172 Malignant neoplasm of ileum: Secondary | ICD-10-CM | POA: Diagnosis not present

## 2015-06-26 DIAGNOSIS — Z5111 Encounter for antineoplastic chemotherapy: Secondary | ICD-10-CM | POA: Diagnosis not present

## 2015-06-26 DIAGNOSIS — R197 Diarrhea, unspecified: Secondary | ICD-10-CM

## 2015-06-26 LAB — CBC WITH DIFFERENTIAL/PLATELET
BASO%: 0.7 % (ref 0.0–2.0)
Basophils Absolute: 0 10*3/uL (ref 0.0–0.1)
EOS%: 3.3 % (ref 0.0–7.0)
Eosinophils Absolute: 0.2 10*3/uL (ref 0.0–0.5)
HEMATOCRIT: 32 % — AB (ref 38.4–49.9)
HEMOGLOBIN: 10 g/dL — AB (ref 13.0–17.1)
LYMPH#: 1.3 10*3/uL (ref 0.9–3.3)
LYMPH%: 20.5 % (ref 14.0–49.0)
MCH: 23.6 pg — ABNORMAL LOW (ref 27.2–33.4)
MCHC: 31.4 g/dL — AB (ref 32.0–36.0)
MCV: 75.1 fL — ABNORMAL LOW (ref 79.3–98.0)
MONO#: 0.5 10*3/uL (ref 0.1–0.9)
MONO%: 7.5 % (ref 0.0–14.0)
NEUT#: 4.4 10*3/uL (ref 1.5–6.5)
NEUT%: 68 % (ref 39.0–75.0)
PLATELETS: 156 10*3/uL (ref 140–400)
RBC: 4.26 10*6/uL (ref 4.20–5.82)
RDW: 17.5 % — ABNORMAL HIGH (ref 11.0–14.6)
WBC: 6.5 10*3/uL (ref 4.0–10.3)

## 2015-06-26 LAB — COMPREHENSIVE METABOLIC PANEL (CC13)
ALBUMIN: 3.2 g/dL — AB (ref 3.5–5.0)
ALK PHOS: 62 U/L (ref 40–150)
ANION GAP: 5 meq/L (ref 3–11)
AST: 16 U/L (ref 5–34)
BILIRUBIN TOTAL: 0.34 mg/dL (ref 0.20–1.20)
BUN: 10.2 mg/dL (ref 7.0–26.0)
CALCIUM: 9 mg/dL (ref 8.4–10.4)
CHLORIDE: 107 meq/L (ref 98–109)
CO2: 24 mEq/L (ref 22–29)
CREATININE: 0.8 mg/dL (ref 0.7–1.3)
EGFR: >90 mL/min/{1.73_m2} (ref >90)
Glucose: 95 mg/dl (ref 70–140)
Potassium: 3.9 mEq/L (ref 3.5–5.1)
Sodium: 136 mEq/L (ref 136–145)
TOTAL PROTEIN: 5.8 g/dL — AB (ref 6.4–8.3)

## 2015-06-26 MED ORDER — SODIUM CHLORIDE 0.9 % IJ SOLN
10.0000 mL | INTRAMUSCULAR | Status: DC | PRN
  Administered 2015-06-26: 10 mL via INTRAVENOUS
  Filled 2015-06-26: qty 10

## 2015-06-26 MED ORDER — FLUOROURACIL CHEMO INJECTION 2.5 GM/50ML
300.0000 mg/m2 | Freq: Once | INTRAVENOUS | Status: AC
  Administered 2015-06-26: 650 mg via INTRAVENOUS
  Filled 2015-06-26: qty 13

## 2015-06-26 MED ORDER — SODIUM CHLORIDE 0.9 % IV SOLN
1800.0000 mg/m2 | INTRAVENOUS | Status: DC
  Administered 2015-06-26: 3900 mg via INTRAVENOUS
  Filled 2015-06-26: qty 78

## 2015-06-26 MED ORDER — DEXTROSE 5 % IV SOLN
Freq: Once | INTRAVENOUS | Status: AC
  Administered 2015-06-26: 14:00:00 via INTRAVENOUS

## 2015-06-26 MED ORDER — LEUCOVORIN CALCIUM INJECTION 350 MG
300.0000 mg/m2 | Freq: Once | INTRAMUSCULAR | Status: AC
  Administered 2015-06-26: 648 mg via INTRAVENOUS
  Filled 2015-06-26: qty 32.4

## 2015-06-26 MED ORDER — SODIUM CHLORIDE 0.9 % IV SOLN
Freq: Once | INTRAVENOUS | Status: AC
  Administered 2015-06-26: 14:00:00 via INTRAVENOUS
  Filled 2015-06-26: qty 4

## 2015-06-26 MED ORDER — OXALIPLATIN CHEMO INJECTION 100 MG/20ML
85.0000 mg/m2 | Freq: Once | INTRAVENOUS | Status: AC
  Administered 2015-06-26: 185 mg via INTRAVENOUS
  Filled 2015-06-26: qty 37

## 2015-06-26 NOTE — Patient Instructions (Signed)

## 2015-06-26 NOTE — Patient Instructions (Signed)
[facility] Discharge Instructions for Patients Receiving Chemotherapy  Today you received the following chemotherapy agents oxaliplatin, leucovorin, 5FU push and 5FU pump  To help prevent nausea and vomiting after your treatment, we encourage you to take your nausea medication COMPAZINE AT 7 PM AND IN AM THEN IF YOU ARE NAUSEATED   If you develop nausea and vomiting that is not controlled by your nausea medication, call the clinic.   BELOW ARE SYMPTOMS THAT SHOULD BE REPORTED IMMEDIATELY:  *FEVER GREATER THAN 100.5 F  *CHILLS WITH OR WITHOUT FEVER  NAUSEA AND VOMITING THAT IS NOT CONTROLLED WITH YOUR NAUSEA MEDICATION  *UNUSUAL SHORTNESS OF BREATH  *UNUSUAL BRUISING OR BLEEDING  TENDERNESS IN MOUTH AND THROAT WITH OR WITHOUT PRESENCE OF ULCERS  *URINARY PROBLEMS  *BOWEL PROBLEMS  UNUSUAL RASH Items with * indicate a potential emergency and should be followed up as soon as possible.  Feel free to call the clinic you have any questions or concerns. The clinic phone number is [phone].  Please show the [facility] at check-in to the Emergency Department and triage nurse.

## 2015-06-26 NOTE — Telephone Encounter (Signed)
Added appts for NOV...week of thanksgiving adjusted

## 2015-06-26 NOTE — Telephone Encounter (Signed)
Added appt per pof pt ok and aware..the patient aware of holiday change adjustment. Mrs. Harold Price sched adn avs

## 2015-06-26 NOTE — Progress Notes (Addendum)
  [facility] OFFICE PROGRESS NOTE   Diagnosis:  Colon cancer  INTERVAL HISTORY:   Mr. Koeppen returns as scheduled. He underwent placement of a Port-A-Cath on 06/20/2015. Stool consistency varies from "watery to stiff". He takes Imodium and Lomotil but has "run out" of both. He denies nausea/vomiting. He reports good oral intake. He denies pain.  Objective:  Vital signs in last 24 hours:  Blood pressure 145/61, pulse 58, temperature 97.7 F (36.5 C), temperature source Oral, resp. rate 18, height $RemoveBe'6\' 2"'fJyocIMaP$  (1.88 m), weight 203 lb 1.6 oz (92.126 kg).    HEENT: No thrush or ulcers. Resp: Lungs clear bilaterally. Cardio: Regular rate and rhythm. GI: Right lower quadrant ileostomy with liquid stool in the collection bag. Vascular: Trace lower leg edema bilaterally. Port-A-Cath without erythema.    Lab Results:  Lab Results  Component Value Date   WBC 6.5 06/26/2015   HGB 10.0* 06/26/2015   HCT 32.0* 06/26/2015   MCV 75.1* 06/26/2015   PLT 156 06/26/2015   NEUTROABS 4.4 06/26/2015    Imaging:  No results found.  Medications: I have reviewed the patient's current medications.  Assessment/Plan: 1. 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 Bleeding duodenal ulcer February 2014  3. History of heavy alcohol use  4. Enterobacter bacteremia following surgery August 2016  5. Microcytic anemia secondary to #1  6. CT chest 05/15/2015-negative for metastatic disease, fatty mass at the left chest wall-low-grade neoplasm not excluded  7.    Port-A-Cath placement 06/20/2015    Disposition: Mr.  Cederberg appears stable. Plan to proceed with cycle 1 FOLFOX today as scheduled. The 5-fluorouracil will be dose reduced due to persistent diarrhea.  We reviewed the dosing instructions for Imodium and Lomotil. We confirmed he now has Imodium. There is a prescription for Lomotil at his pharmacy. He will contact the office with increased diarrhea following chemotherapy.  Mr. Mcalexander will return for a follow-up visit and cycle 2 FOLFOX in 2 weeks. He will contact the office in the interim as outlined above or with any other problems.  Patient seen with Dr. Benay Spice.    Ned Card ANP/GNP-BC   06/26/2015  11:45 AM  This was a shared visit with Ned Card. The abdominal wound has healed. Encouraged him to resume Imodium. He appears stable to begin FOLFOX chemotherapy.  Julieanne Manson, M.D.

## 2015-06-27 ENCOUNTER — Telehealth: Payer: Self-pay | Admitting: *Deleted

## 2015-06-27 ENCOUNTER — Other Ambulatory Visit: Payer: Self-pay | Admitting: Oncology

## 2015-06-27 LAB — CEA: CEA: 3.1 ng/mL (ref 0.0–5.0)

## 2015-06-27 NOTE — Telephone Encounter (Signed)
Pt reports " I'm eating good; no problems at all"  Pt denies fever/nausea/vomiting; reports no diarrhea at this time.  Pt verbalized understanding to call if any problems and confirmed appt for pump d/c 06/28/15

## 2015-06-27 NOTE — Telephone Encounter (Signed)
-----   Message from Janace Hoard, RN sent at 06/26/2015  5:32 PM EDT ----- Regarding: chemo follow up call [facility] Oxaliplatin, leucovorin, 5FU push and pump, tolerated well. Gets confused easily.

## 2015-06-28 ENCOUNTER — Ambulatory Visit (HOSPITAL_BASED_OUTPATIENT_CLINIC_OR_DEPARTMENT_OTHER): Payer: Medicare Other

## 2015-06-28 VITALS — BP 131/48 | HR 56 | Temp 97.3°F | Resp 16

## 2015-06-28 DIAGNOSIS — C187 Malignant neoplasm of sigmoid colon: Secondary | ICD-10-CM | POA: Diagnosis not present

## 2015-06-28 DIAGNOSIS — C189 Malignant neoplasm of colon, unspecified: Secondary | ICD-10-CM

## 2015-06-28 MED ORDER — HEPARIN SOD (PORK) LOCK FLUSH 100 UNIT/ML IV SOLN
500.0000 [IU] | Freq: Once | INTRAVENOUS | Status: AC | PRN
  Administered 2015-06-28: 500 [IU]
  Filled 2015-06-28: qty 5

## 2015-06-28 MED ORDER — SODIUM CHLORIDE 0.9 % IJ SOLN
10.0000 mL | INTRAMUSCULAR | Status: DC | PRN
  Administered 2015-06-28: 10 mL
  Filled 2015-06-28: qty 10

## 2015-07-03 ENCOUNTER — Other Ambulatory Visit: Payer: Self-pay | Admitting: Oncology

## 2015-07-08 ENCOUNTER — Other Ambulatory Visit: Payer: Self-pay | Admitting: Oncology

## 2015-07-10 ENCOUNTER — Ambulatory Visit (HOSPITAL_BASED_OUTPATIENT_CLINIC_OR_DEPARTMENT_OTHER): Payer: Medicare Other

## 2015-07-10 ENCOUNTER — Ambulatory Visit: Payer: Medicare Other | Admitting: Nutrition

## 2015-07-10 ENCOUNTER — Ambulatory Visit (HOSPITAL_BASED_OUTPATIENT_CLINIC_OR_DEPARTMENT_OTHER): Payer: Medicare Other | Admitting: Nurse Practitioner

## 2015-07-10 ENCOUNTER — Other Ambulatory Visit (HOSPITAL_BASED_OUTPATIENT_CLINIC_OR_DEPARTMENT_OTHER): Payer: Medicare Other

## 2015-07-10 ENCOUNTER — Ambulatory Visit: Payer: Medicare Other

## 2015-07-10 VITALS — BP 134/57 | HR 59 | Temp 97.8°F | Resp 20 | Ht 74.0 in | Wt 200.8 lb

## 2015-07-10 DIAGNOSIS — C182 Malignant neoplasm of ascending colon: Secondary | ICD-10-CM

## 2015-07-10 DIAGNOSIS — D509 Iron deficiency anemia, unspecified: Secondary | ICD-10-CM | POA: Diagnosis not present

## 2015-07-10 DIAGNOSIS — C189 Malignant neoplasm of colon, unspecified: Secondary | ICD-10-CM

## 2015-07-10 DIAGNOSIS — C172 Malignant neoplasm of ileum: Secondary | ICD-10-CM

## 2015-07-10 DIAGNOSIS — C187 Malignant neoplasm of sigmoid colon: Secondary | ICD-10-CM | POA: Diagnosis not present

## 2015-07-10 DIAGNOSIS — Z5111 Encounter for antineoplastic chemotherapy: Secondary | ICD-10-CM | POA: Diagnosis present

## 2015-07-10 DIAGNOSIS — Z95828 Presence of other vascular implants and grafts: Secondary | ICD-10-CM

## 2015-07-10 LAB — CBC WITH DIFFERENTIAL/PLATELET
BASO%: 0.6 % (ref 0.0–2.0)
Basophils Absolute: 0 10*3/uL (ref 0.0–0.1)
EOS%: 5.2 % (ref 0.0–7.0)
Eosinophils Absolute: 0.3 10*3/uL (ref 0.0–0.5)
HEMATOCRIT: 31.3 % — AB (ref 38.4–49.9)
HEMOGLOBIN: 9.9 g/dL — AB (ref 13.0–17.1)
LYMPH#: 1.6 10*3/uL (ref 0.9–3.3)
LYMPH%: 32 % (ref 14.0–49.0)
MCH: 23.6 pg — ABNORMAL LOW (ref 27.2–33.4)
MCHC: 31.6 g/dL — ABNORMAL LOW (ref 32.0–36.0)
MCV: 74.6 fL — ABNORMAL LOW (ref 79.3–98.0)
MONO#: 0.5 10*3/uL (ref 0.1–0.9)
MONO%: 9.7 % (ref 0.0–14.0)
NEUT#: 2.6 10*3/uL (ref 1.5–6.5)
NEUT%: 52.5 % (ref 39.0–75.0)
Platelets: 163 10*3/uL (ref 140–400)
RBC: 4.2 10*6/uL (ref 4.20–5.82)
RDW: 16.8 % — AB (ref 11.0–14.6)
WBC: 5 10*3/uL (ref 4.0–10.3)

## 2015-07-10 LAB — COMPREHENSIVE METABOLIC PANEL (CC13)
ALBUMIN: 3.3 g/dL — AB (ref 3.5–5.0)
ALK PHOS: 66 U/L (ref 40–150)
AST: 16 U/L (ref 5–34)
Anion Gap: 6 mEq/L (ref 3–11)
BUN: 9.9 mg/dL (ref 7.0–26.0)
CALCIUM: 9.2 mg/dL (ref 8.4–10.4)
CHLORIDE: 109 meq/L (ref 98–109)
CO2: 23 mEq/L (ref 22–29)
CREATININE: 0.8 mg/dL (ref 0.7–1.3)
EGFR: >90 mL/min/{1.73_m2} (ref >90)
GLUCOSE: 122 mg/dL (ref 70–140)
Potassium: 4 mEq/L (ref 3.5–5.1)
Sodium: 138 mEq/L (ref 136–145)
Total Bilirubin: <0.3 mg/dL (ref 0.20–1.20)
Total Protein: 6 g/dL — ABNORMAL LOW (ref 6.4–8.3)

## 2015-07-10 MED ORDER — DEXTROSE 5 % IV SOLN
Freq: Once | INTRAVENOUS | Status: AC
  Administered 2015-07-10: 11:00:00 via INTRAVENOUS

## 2015-07-10 MED ORDER — SODIUM CHLORIDE 0.9 % IJ SOLN
10.0000 mL | INTRAMUSCULAR | Status: DC | PRN
  Filled 2015-07-10: qty 10

## 2015-07-10 MED ORDER — OXALIPLATIN CHEMO INJECTION 100 MG/20ML
85.0000 mg/m2 | Freq: Once | INTRAVENOUS | Status: AC
  Administered 2015-07-10: 185 mg via INTRAVENOUS
  Filled 2015-07-10: qty 37

## 2015-07-10 MED ORDER — FLUOROURACIL CHEMO INJECTION 2.5 GM/50ML
300.0000 mg/m2 | Freq: Once | INTRAVENOUS | Status: AC
  Administered 2015-07-10: 650 mg via INTRAVENOUS
  Filled 2015-07-10: qty 13

## 2015-07-10 MED ORDER — SODIUM CHLORIDE 0.9 % IJ SOLN
10.0000 mL | INTRAMUSCULAR | Status: DC | PRN
Start: 2015-07-10 — End: 2015-07-10
  Administered 2015-07-10: 10 mL via INTRAVENOUS
  Filled 2015-07-10: qty 10

## 2015-07-10 MED ORDER — HEPARIN SOD (PORK) LOCK FLUSH 100 UNIT/ML IV SOLN
500.0000 [IU] | Freq: Once | INTRAVENOUS | Status: DC | PRN
  Filled 2015-07-10: qty 5

## 2015-07-10 MED ORDER — SODIUM CHLORIDE 0.9 % IV SOLN
Freq: Once | INTRAVENOUS | Status: AC
  Administered 2015-07-10: 11:00:00 via INTRAVENOUS
  Filled 2015-07-10: qty 4

## 2015-07-10 MED ORDER — LEUCOVORIN CALCIUM INJECTION 350 MG
300.0000 mg/m2 | Freq: Once | INTRAVENOUS | Status: AC
  Administered 2015-07-10: 648 mg via INTRAVENOUS
  Filled 2015-07-10: qty 32.4

## 2015-07-10 MED ORDER — SODIUM CHLORIDE 0.9 % IV SOLN
1800.0000 mg/m2 | INTRAVENOUS | Status: DC
  Administered 2015-07-10: 3900 mg via INTRAVENOUS
  Filled 2015-07-10: qty 78

## 2015-07-10 NOTE — Progress Notes (Signed)
Nutrition follow-up completed with patient who is treated for colon cancer. Patient reports his appetite is good and he is eating well. Weight is stable and documented as 200.8 pounds on October 25. Patient is drinking one to 2 boost most days. Patient is increased water consumption. Ileostomy output is acceptable. Patient denies nutrition impact symptoms.  Nutrition diagnosis: Unintended weight loss improved.  Intervention:  Educated patient to continue strategies for adequate calories and protein as well as fluid intake. Recommended patient continue boost as needed to promote weight maintenance. Offered coupons.  However, patient declined at this time. Teach back method was used.  Monitoring, evaluation, goals: Patient has tolerated adequate calories and protein in his achieved weight stabilization.  Next visit: Tuesday, November 8, during infusion.  **Disclaimer: This note was dictated with voice recognition software. Similar sounding words can inadvertently be transcribed and this note may contain transcription errors which may not have been corrected upon publication of note.**

## 2015-07-10 NOTE — Patient Instructions (Signed)

## 2015-07-10 NOTE — Patient Instructions (Signed)
[facility] Discharge Instructions for Patients Receiving Chemotherapy  Today you received the following chemotherapy agents: Leucovorin, Oxaliplatin, 67fu pump/push  You can take up to 8 imodium and 9 lomotil daily; you may take 1 tablet of each after loose stools. Do not exceed more than 8 imodium tablets or 8 lomotil tablets.  To help prevent nausea and vomiting after your treatment, we encourage you to take your nausea medication as prescribed by your physician.   If you develop nausea and vomiting that is not controlled by your nausea medication, call the clinic.   BELOW ARE SYMPTOMS THAT SHOULD BE REPORTED IMMEDIATELY:  *FEVER GREATER THAN 100.5 F  *CHILLS WITH OR WITHOUT FEVER  NAUSEA AND VOMITING THAT IS NOT CONTROLLED WITH YOUR NAUSEA MEDICATION  *UNUSUAL SHORTNESS OF BREATH  *UNUSUAL BRUISING OR BLEEDING  TENDERNESS IN MOUTH AND THROAT WITH OR WITHOUT PRESENCE OF ULCERS  *URINARY PROBLEMS  *BOWEL PROBLEMS  UNUSUAL RASH Items with * indicate a potential emergency and should be followed up as soon as possible.  Feel free to call the clinic you have any questions or concerns. The clinic phone number is [phone].  Please show the [facility] at check-in to the Emergency Department and triage nurse.

## 2015-07-10 NOTE — Progress Notes (Signed)
  [facility] OFFICE PROGRESS NOTE   Diagnosis:  Colon cancer  INTERVAL HISTORY:   Mr. Spano returns as scheduled. He completed cycle 1 FOLFOX 06/26/2015. He denies nausea/vomiting. No mouth sores. He notes improvement in the diarrhea. He continues Imodium and Lomotil. No numbness or tingling in his hands or feet.  Objective:  Vital signs in last 24 hours:  Blood pressure 134/57, pulse 59, temperature 97.8 F (36.6 C), temperature source Oral, resp. rate 20, height $RemoveBe'6\' 2"'PdckLCMcu$  (1.88 m), weight 200 lb 12.8 oz (91.082 kg), SpO2 100 %.    HEENT: No thrush or ulcers. Resp: Lungs clear bilaterally. Cardio: Regular rate and rhythm. GI: Abdomen soft and nontender. No hepatomegaly. Right lower quadrant ileostomy with thick stool in the collection bag. Vascular: Trace lower leg edema bilaterally. Port-A-Cath without erythema.   Lab Results:  Lab Results  Component Value Date   WBC 5.0 07/10/2015   HGB 9.9* 07/10/2015   HCT 31.3* 07/10/2015   MCV 74.6* 07/10/2015   PLT 163 07/10/2015   NEUTROABS 2.6 07/10/2015    Imaging:  No results found.  Medications: I have reviewed the patient's current medications.  Assessment/Plan: 1. 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 Bleeding duodenal ulcer February 2014  3. History of heavy alcohol use  4. Enterobacter bacteremia following surgery August 2016  5. Microcytic anemia secondary to #1  6. CT chest 05/15/2015-negative for metastatic disease, fatty mass at the left chest wall-low-grade neoplasm not excluded  7.  Port-A-Cath placement 06/20/2015   Disposition: Mr. Fok appears stable. He has completed 1 cycle of FOLFOX. Plan to proceed with cycle 2 today as scheduled.  The diarrhea is better. He will continue Imodium and Lomotil. We reviewed the dosing instructions for both. He understands to contact the office with watery diarrhea.  He will return for follow-up visit and cycle 3 FOLFOX in 2 weeks. He will contact the office in the interim as outlined above or with any other problems.    Ned Card ANP/GNP-BC   07/10/2015  11:11 AM

## 2015-07-12 ENCOUNTER — Ambulatory Visit (HOSPITAL_BASED_OUTPATIENT_CLINIC_OR_DEPARTMENT_OTHER): Payer: Medicare Other

## 2015-07-12 VITALS — BP 125/52 | HR 58 | Temp 98.1°F | Resp 18

## 2015-07-12 DIAGNOSIS — C189 Malignant neoplasm of colon, unspecified: Secondary | ICD-10-CM

## 2015-07-12 DIAGNOSIS — C172 Malignant neoplasm of ileum: Secondary | ICD-10-CM

## 2015-07-12 DIAGNOSIS — C182 Malignant neoplasm of ascending colon: Secondary | ICD-10-CM

## 2015-07-12 MED ORDER — SODIUM CHLORIDE 0.9 % IJ SOLN
10.0000 mL | INTRAMUSCULAR | Status: DC | PRN
  Administered 2015-07-12: 10 mL
  Filled 2015-07-12: qty 10

## 2015-07-12 MED ORDER — HEPARIN SOD (PORK) LOCK FLUSH 100 UNIT/ML IV SOLN
500.0000 [IU] | Freq: Once | INTRAVENOUS | Status: AC | PRN
  Administered 2015-07-12: 500 [IU]
  Filled 2015-07-12: qty 5

## 2015-07-12 NOTE — Patient Instructions (Signed)

## 2015-07-13 ENCOUNTER — Other Ambulatory Visit (HOSPITAL_BASED_OUTPATIENT_CLINIC_OR_DEPARTMENT_OTHER): Payer: Medicare Other

## 2015-07-13 ENCOUNTER — Other Ambulatory Visit (HOSPITAL_COMMUNITY)
Admission: AD | Admit: 2015-07-13 | Discharge: 2015-07-13 | Disposition: A | Discharge status: Home / Self Care | Payer: Medicare Other | Source: Ambulatory Visit | Attending: Oncology | Admitting: Oncology

## 2015-07-13 ENCOUNTER — Ambulatory Visit (HOSPITAL_BASED_OUTPATIENT_CLINIC_OR_DEPARTMENT_OTHER): Payer: Medicare Other | Admitting: Nurse Practitioner

## 2015-07-13 ENCOUNTER — Telehealth: Payer: Self-pay | Admitting: *Deleted

## 2015-07-13 ENCOUNTER — Telehealth: Payer: Self-pay

## 2015-07-13 ENCOUNTER — Emergency Department (HOSPITAL_COMMUNITY)
Admission: EM | Admit: 2015-07-13 | Discharge: 2015-07-13 | Disposition: A | Discharge status: Home / Self Care | Payer: Medicare Other | Attending: Emergency Medicine | Admitting: Emergency Medicine

## 2015-07-13 ENCOUNTER — Encounter (HOSPITAL_COMMUNITY): Payer: Self-pay | Admitting: Emergency Medicine

## 2015-07-13 VITALS — BP 125/55 | HR 61 | Temp 97.5°F | Resp 18 | Ht 74.0 in | Wt 201.6 lb

## 2015-07-13 DIAGNOSIS — Z859 Personal history of malignant neoplasm, unspecified: Secondary | ICD-10-CM | POA: Diagnosis not present

## 2015-07-13 DIAGNOSIS — Z79899 Other long term (current) drug therapy: Secondary | ICD-10-CM | POA: Insufficient documentation

## 2015-07-13 DIAGNOSIS — I10 Essential (primary) hypertension: Secondary | ICD-10-CM | POA: Insufficient documentation

## 2015-07-13 DIAGNOSIS — K521 Toxic gastroenteritis and colitis: Secondary | ICD-10-CM

## 2015-07-13 DIAGNOSIS — C172 Malignant neoplasm of ileum: Secondary | ICD-10-CM

## 2015-07-13 DIAGNOSIS — K922 Gastrointestinal hemorrhage, unspecified: Secondary | ICD-10-CM

## 2015-07-13 DIAGNOSIS — Z9849 Cataract extraction status, unspecified eye: Secondary | ICD-10-CM | POA: Diagnosis not present

## 2015-07-13 DIAGNOSIS — Z8739 Personal history of other diseases of the musculoskeletal system and connective tissue: Secondary | ICD-10-CM | POA: Insufficient documentation

## 2015-07-13 DIAGNOSIS — C189 Malignant neoplasm of colon, unspecified: Secondary | ICD-10-CM

## 2015-07-13 DIAGNOSIS — Z8659 Personal history of other mental and behavioral disorders: Secondary | ICD-10-CM | POA: Insufficient documentation

## 2015-07-13 DIAGNOSIS — C182 Malignant neoplasm of ascending colon: Secondary | ICD-10-CM | POA: Diagnosis not present

## 2015-07-13 DIAGNOSIS — Z8711 Personal history of peptic ulcer disease: Secondary | ICD-10-CM | POA: Diagnosis not present

## 2015-07-13 DIAGNOSIS — K921 Melena: Secondary | ICD-10-CM | POA: Diagnosis not present

## 2015-07-13 DIAGNOSIS — R197 Diarrhea, unspecified: Secondary | ICD-10-CM | POA: Insufficient documentation

## 2015-07-13 DIAGNOSIS — T451X5A Adverse effect of antineoplastic and immunosuppressive drugs, initial encounter: Secondary | ICD-10-CM | POA: Insufficient documentation

## 2015-07-13 LAB — COMPREHENSIVE METABOLIC PANEL (CC13)
ALT: <9 U/L (ref 0–55)
AST: 18 U/L (ref 5–34)
Albumin: 3.4 g/dL — ABNORMAL LOW (ref 3.5–5.0)
Alkaline Phosphatase: 58 U/L (ref 40–150)
Anion Gap: 5 mEq/L (ref 3–11)
BUN: 11.1 mg/dL (ref 7.0–26.0)
CALCIUM: 9.3 mg/dL (ref 8.4–10.4)
CHLORIDE: 104 meq/L (ref 98–109)
CO2: 26 mEq/L (ref 22–29)
Creatinine: 0.8 mg/dL (ref 0.7–1.3)
EGFR: >90 mL/min/{1.73_m2} (ref >90)
GLUCOSE: 96 mg/dL (ref 70–140)
POTASSIUM: 3.8 meq/L (ref 3.5–5.1)
SODIUM: 135 meq/L — AB (ref 136–145)
Total Bilirubin: 0.65 mg/dL (ref 0.20–1.20)
Total Protein: 6.1 g/dL — ABNORMAL LOW (ref 6.4–8.3)

## 2015-07-13 LAB — POC OCCULT BLOOD, ED: FECAL OCCULT BLD: NEGATIVE

## 2015-07-13 LAB — CBC WITH DIFFERENTIAL/PLATELET
BASO%: 0.9 % (ref 0.0–2.0)
Basophils Absolute: 0 10*3/uL (ref 0.0–0.1)
EOS%: 3.1 % (ref 0.0–7.0)
Eosinophils Absolute: 0.1 10*3/uL (ref 0.0–0.5)
HCT: 32.4 % — ABNORMAL LOW (ref 38.4–49.9)
HGB: 10.1 g/dL — ABNORMAL LOW (ref 13.0–17.1)
LYMPH%: 34.8 % (ref 14.0–49.0)
MCH: 23.4 pg — ABNORMAL LOW (ref 27.2–33.4)
MCHC: 31.1 g/dL — ABNORMAL LOW (ref 32.0–36.0)
MCV: 75.1 fL — ABNORMAL LOW (ref 79.3–98.0)
MONO#: 0.4 10*3/uL (ref 0.1–0.9)
MONO%: 9.6 % (ref 0.0–14.0)
NEUT#: 2.2 10*3/uL (ref 1.5–6.5)
NEUT%: 51.6 % (ref 39.0–75.0)
Platelets: 161 10*3/uL (ref 140–400)
RBC: 4.32 10*6/uL (ref 4.20–5.82)
RDW: 16.8 % — ABNORMAL HIGH (ref 11.0–14.6)
WBC: 4.2 10*3/uL (ref 4.0–10.3)
lymph#: 1.5 10*3/uL (ref 0.9–3.3)

## 2015-07-13 LAB — I-STAT CG4 LACTIC ACID, ED
LACTIC ACID, VENOUS: 2.92 mmol/L — AB (ref 0.5–2.0)
Lactic Acid, Venous: 0.33 mmol/L — ABNORMAL LOW (ref 0.5–2.0)

## 2015-07-13 LAB — MAGNESIUM (CC13): Magnesium: 2.3 mg/dl (ref 1.5–2.5)

## 2015-07-13 LAB — C DIFFICILE QUICK SCREEN W PCR REFLEX
C DIFFICLE (CDIFF) ANTIGEN: NEGATIVE
C Diff interpretation: NEGATIVE
C Diff toxin: NEGATIVE

## 2015-07-13 MED ORDER — SODIUM CHLORIDE 0.9 % IV BOLUS (SEPSIS)
1000.0000 mL | Freq: Once | INTRAVENOUS | Status: AC
  Administered 2015-07-13: 1000 mL via INTRAVENOUS

## 2015-07-13 MED ORDER — HEPARIN SOD (PORK) LOCK FLUSH 100 UNIT/ML IV SOLN
500.0000 [IU] | Freq: Once | INTRAVENOUS | Status: AC
  Administered 2015-07-13: 500 [IU]
  Filled 2015-07-13: qty 5

## 2015-07-13 NOTE — ED Notes (Signed)
Bed: TD97 Expected date:  Expected time:  Means of arrival:  Comments: Pt from [facility]

## 2015-07-13 NOTE — ED Notes (Signed)
Lactic Acid 2.92, notified RN Rosser

## 2015-07-13 NOTE — ED Notes (Addendum)
Pt reports loose stools in ostomy onset last night BUT stools returned back to normal consistancy "an hour or two ago"; [facility] confirms blood in stool today via occult testing; last chemo treatment Tuesday; pt denies pain or other symptoms at present time.

## 2015-07-13 NOTE — ED Notes (Addendum)
Pt, being sent by [facility], c/o diarrhea and GI bleeding.  Hx of colon CA w/ colostomy placed in August.  Last chemo x 3 days.  Vitals and Hgb stable.

## 2015-07-13 NOTE — Telephone Encounter (Signed)
Returning Harold Price call from [phone]. He got his pump removed yesterday 10/27 for FOLFOX. Yesterday he emptied his colostomy 3 times of thicker stool. Today he has emptied his colostomy twice of just water. He is drinking without problems. He is using immodium 4 tabs QID and lomotil 4 tabs QID with no relief. S/w [facility] and will set up lab and [facility] visit today. Pt aware of times

## 2015-07-13 NOTE — Discharge Instructions (Signed)
Food Choices to Help Relieve Diarrhea, Adult When you have diarrhea, the foods you eat and your eating habits are very important. Choosing the right foods and drinks can help relieve diarrhea. Also, because diarrhea can last up to 7 days, you need to replace lost fluids and electrolytes (such as sodium, potassium, and chloride) in order to help prevent dehydration.  WHAT GENERAL GUIDELINES DO I NEED TO FOLLOW?  Slowly drink 1 cup (8 oz) of fluid for each episode of diarrhea. If you are getting enough fluid, your urine will be clear or pale yellow.  Eat starchy foods. Some good choices include white rice, white toast, pasta, low-fiber cereal, baked potatoes (without the skin), saltine crackers, and bagels.  Avoid large servings of any cooked vegetables.  Limit fruit to two servings per day. A serving is  cup or 1 small piece.  Choose foods with less than 2 g of fiber per serving.  Limit fats to less than 8 tsp (38 g) per day.  Avoid fried foods.  Eat foods that have probiotics in them. Probiotics can be found in certain dairy products.  Avoid foods and beverages that may increase the speed at which food moves through the stomach and intestines (gastrointestinal tract). Things to avoid include:  High-fiber foods, such as dried fruit, raw fruits and vegetables, nuts, seeds, and whole grain foods.  Spicy foods and high-fat foods.  Foods and beverages sweetened with high-fructose corn syrup, honey, or sugar alcohols such as xylitol, sorbitol, and mannitol. WHAT FOODS ARE RECOMMENDED? Grains White rice. White, [place], or pita breads (fresh or toasted), including plain rolls, buns, or bagels. White pasta. Saltine, soda, or graham crackers. Pretzels. Low-fiber cereal. Cooked cereals made with water (such as cornmeal, farina, or cream cereals). Plain muffins. Matzo. Melba toast. Zwieback.  Vegetables Potatoes (without the skin). Strained tomato and vegetable juices. Most well-cooked and canned  vegetables without seeds. Tender lettuce. Fruits Cooked or canned applesauce, apricots, cherries, fruit cocktail, grapefruit, peaches, pears, or plums. Fresh bananas, apples without skin, cherries, grapes, cantaloupe, grapefruit, peaches, oranges, or plums.  Meat and Other Protein Products Baked or boiled chicken. Eggs. Tofu. Fish. Seafood. Smooth peanut butter. Ground or well-cooked tender beef, ham, veal, lamb, pork, or poultry.  Dairy Plain yogurt, kefir, and unsweetened liquid yogurt. Lactose-free milk, buttermilk, or soy milk. Plain hard cheese. Beverages Sport drinks. Clear broths. Diluted fruit juices (except prune). Regular, caffeine-free sodas such as ginger ale. Water. Decaffeinated teas. Oral rehydration solutions. Sugar-free beverages not sweetened with sugar alcohols. Other Bouillon, broth, or soups made from recommended foods.  The items listed above may not be a complete list of recommended foods or beverages. Contact your dietitian for more options. WHAT FOODS ARE NOT RECOMMENDED? Grains Whole grain, whole wheat, bran, or rye breads, rolls, pastas, crackers, and cereals. Wild or brown rice. Cereals that contain more than 2 g of fiber per serving. Corn tortillas or taco shells. Cooked or dry oatmeal. Granola. Popcorn. Vegetables Raw vegetables. Cabbage, broccoli, Brussels sprouts, artichokes, baked beans, beet greens, corn, kale, legumes, peas, sweet potatoes, and yams. Potato skins. Cooked spinach and cabbage. Fruits Dried fruit, including raisins and dates. Raw fruits. Stewed or dried prunes. Fresh apples with skin, apricots, mangoes, pears, raspberries, and strawberries.  Meat and Other Protein Products Chunky peanut butter. Nuts and seeds. Beans and lentils. Berniece Salines.  Dairy High-fat cheeses. Milk, chocolate milk, and beverages made with milk, such as milk shakes. Cream. Ice cream. Sweets and Desserts Sweet rolls, doughnuts, and sweet breads.  Pancakes and waffles. Fats and  Oils Butter. Cream sauces. Margarine. Salad oils. Plain salad dressings. Olives. Avocados.  Beverages Caffeinated beverages (such as coffee, tea, soda, or energy drinks). Alcoholic beverages. Fruit juices with pulp. Prune juice. Soft drinks sweetened with high-fructose corn syrup or sugar alcohols. Other Coconut. Hot sauce. Chili powder. Mayonnaise. Gravy. Cream-based or milk-based soups.  The items listed above may not be a complete list of foods and beverages to avoid. Contact your dietitian for more information. WHAT SHOULD I DO IF I BECOME DEHYDRATED? Diarrhea can sometimes lead to dehydration. Signs of dehydration include dark urine and dry mouth and skin. If you think you are dehydrated, you should rehydrate with an oral rehydration solution. These solutions can be purchased at pharmacies, retail stores, or online.  Drink -1 cup (120-240 mL) of oral rehydration solution each time you have an episode of diarrhea. If drinking this amount makes your diarrhea worse, try drinking smaller amounts more often. For example, drink 1-3 tsp (5-15 mL) every 5-10 minutes.  A general rule for staying hydrated is to drink 1-2 L of fluid per day. Talk to your health care provider about the specific amount you should be drinking each day. Drink enough fluids to keep your urine clear or pale yellow.   This information is not intended to replace advice given to you by your health care provider. Make sure you discuss any questions you have with your health care provider.   Document Released: 11/22/2003 Document Revised: 09/22/2014 Document Reviewed: 07/25/2013 Elsevier Interactive Patient Education 2016 [employer].  Drug Toxicity Drug toxicity refers to harmful and unwanted (adverse) effects of a drug in your body. Drug toxicity often results from taking too much of a drug (overdose) by accident or on purpose. With some drugs, there is only a small difference between the dose that is needed to treat your  condition and a dose that is harmful (narrow therapeutic range). However, any drug can be toxic at high doses, and even normal doses of certain drugs can be toxic for some people. These include over-the-counter (OTC) medicines. Drug toxicity can happen suddenly when you first start taking a drug or when you suddenly take too much of a drug (acute toxicity). It can also happen as a result of taking a drug for a long period of time (chronic toxicity). The effects of drug toxicity can be mild, dangerous, or even deadly. CAUSES Many things can cause drug toxicity. Common causes of acute toxicity include a drug overdose or an allergic reaction to a drug. Most drugs are broken down (metabolized) by your liver and eliminated (excreted) by your kidneys. Chronic drug toxicity can result from changes in the way that your body metabolizes a drug. This can happen, for example, if you weigh less than you did when you started taking a drug but you keep taking the same dose that you took at the heavier weight. RISK FACTORS You may have a higher risk for drug toxicity if you:  Are under 52 years of age or over 42 years of age.  Have liver disease, kidney disease, or another medical condition.  Are taking more than one drug.  Are pregnant.  Are allergic to certain drugs.  Have genes that cause you to be more affected by (susceptible to) certain drugs.  Take a drug that has a narrow therapeutic range. Certain types of drugs are more likely than others to cause toxicity. Many drugs have a narrow therapeutic range, including:  Blood thinners.  Heart medicines.  Diabetes medicines.  Medicines to prevent or stop seizures.  Theophylline for asthma.  Lithium for bipolar disorder. SYMPTOMS Signs and symptoms of drug toxicity depend on the drug and the amount that was taken. They may start suddenly or develop gradually over time. DIAGNOSIS Drug toxicity may be diagnosed based on your symptoms. Some drugs  have known side effects that suggest toxicity. It is important that you tell health care provider about all of the drugs that you are taking and whether you have ever had a reaction to a drug. Your health care provider will do a physical exam. You may have tests to check for drug toxicity, including:  Blood tests to measure the amount of the drug in your blood or to check for signs of kidney or liver damage.  Urine tests.  Other tests to check for organ damage. TREATMENT Treatment may include:  Stopping the drug.  Lowering the dose of the drug.  Switching to a different drug. You may also need treatment to stop or reverse the effects of the toxicity. These treatments depend on the drug that caused the toxicity, how severe the toxicity is, and which parts of your body are affected. HOME CARE INSTRUCTIONS  Take medicines only as directed by your health care provider. Always ask your health care provider to discuss the possible side effects of any new drug that you start taking.  Keep a list of all of the drugs that you take, including over-the-counter medicines. Bring this list with you to all of your medical visits.  Read the drug inserts that come with your medicines.  Keep all follow-up visits as directed by your health care provider. This is important. SEEK MEDICAL CARE IF:  Your symptoms return.  You develop any new signs or symptoms when you are taking medicines.  You notice any signs that indicate that you are taking too much of your medicine, based on what your health care provider told you to watch for. SEEK IMMEDIATE MEDICAL CARE IF:  You have chest pain.  You have difficulty breathing.  You have a loss of consciousness.   This information is not intended to replace advice given to you by your health care provider. Make sure you discuss any questions you have with your health care provider.   Document Released: 09/01/2005 Document Revised: 01/16/2015 Document  Reviewed: 09/06/2014 Elsevier Interactive Patient Education [employer].

## 2015-07-13 NOTE — Telephone Encounter (Signed)
FYI.  Has been scheduled with [facility]. "I'm having a problem with diarrhea.  It started getting better but after yesterday's chemotherapy, I ate ice cream, this may be the problem but I missed a dose of Levsin yesterday.  Call me at alternative phone [phone].  This is a different number because mine is out of order."

## 2015-07-14 NOTE — ED Provider Notes (Signed)
CSN: [id number]     Arrival date & time 07/13/15  1602 History   First MD Initiated Contact with Patient 07/13/15 1621     Chief Complaint  Patient presents with  . Diarrhea  . Blood In Stools     (Consider location/radiation/quality/duration/timing/severity/associated sxs/prior Treatment) Patient is a 81 y.o. male presenting with diarrhea. The history is provided by the patient.  Diarrhea Quality:  Semi-solid Severity:  Moderate Onset quality:  Gradual Duration:  1 day Timing:  Constant Progression:  Improving Relieved by:  Nothing Worsened by:  Nothing tried Ineffective treatments:  None tried Associated symptoms: no abdominal pain, no fever and no vomiting   Associated symptoms comment:  Blood in stool Risk factors comment:  Recent chemotherapy   Past Medical History  Diagnosis Date  . Hypertension   . PUD (peptic ulcer disease)     2006  . Depression   . Headache(784.0)   . Arthritis     general  . Alcohol abuse   . SBO (small bowel obstruction) ([place]) 04/2015  . Transfusion of blood product refused for religious reason   . Cancer [facility])    Past Surgical History  Procedure Laterality Date  . Cataract extraction Right   . Esophagogastroduodenoscopy N/A 11/06/2012    Procedure: ESOPHAGOGASTRODUODENOSCOPY (EGD);  Surgeon: Lear Ng, MD;  Location: [facility] ENDOSCOPY;  Service: Endoscopy;  Laterality: N/A;  . Esophagogastroduodenoscopy N/A 11/07/2012    Procedure: ESOPHAGOGASTRODUODENOSCOPY (EGD);  Surgeon: Lear Ng, MD;  Location: [facility] ENDOSCOPY;  Service: Endoscopy;  Laterality: N/A;  . Laparotomy N/A 04/20/2015    Procedure: EXPLORATORY LAPAROTOMY WITH PARTIAL COLECTOMY;  Surgeon: Rolm Bookbinder, MD;  Location: [place];  Service: General;  Laterality: N/A;  Exploratory lap with right colectomy, small bowel and sigmoid resection, ileostomy  . Colostomy    . Portacath placement Right 06/20/2015    Procedure: INSERTION PORT-A-CATH WITH ULTRASOUND;  Surgeon:  Rolm Bookbinder, MD;  Location: [place];  Service: General;  Laterality: Right;   Family History  Problem Relation Age of Onset  . Ulcers Brother    Social History  Substance Use Topics  . Smoking status: Never Smoker   . Smokeless tobacco: Never Used  . Alcohol Use: 16.8 oz/week    28 Cans of beer per week    Review of Systems  Constitutional: Negative for fever.  Gastrointestinal: Positive for diarrhea. Negative for vomiting and abdominal pain.  All other systems reviewed and are negative.     Allergies  Review of patient's allergies indicates no known allergies.  Home Medications   Prior to Admission medications   Medication Sig Start Date End Date Taking? Authorizing Provider  calcium carbonate (TUMS - DOSED IN MG ELEMENTAL CALCIUM) 500 MG chewable tablet Chew 1 tablet by mouth 2 (two) times daily as needed for indigestion or heartburn.   Yes Historical Provider, MD  HYDROcodone-acetaminophen (NORCO) 5-325 MG tablet Take 1 tablet by mouth every 6 (six) hours as needed for moderate pain. 06/20/15  Yes Rolm Bookbinder, MD  loperamide (IMODIUM) 2 MG capsule Take 2 mg by mouth 3 (three) times daily.   Yes Historical Provider, MD  prochlorperazine (COMPAZINE) 10 MG tablet Take 1 tablet (10 mg total) by mouth every 6 (six) hours as needed for nausea. 06/15/15  Yes Ladell Pier, MD  diphenoxylate-atropine (LOMOTIL) 2.5-0.025 MG tablet TAKE 2 TABLETS BY MOUTH 3 TIMES A DAY AS NEEDED FOR DIARRHEA OR LOOSE STOOLS Patient not taking: Reported on 07/13/2015 07/04/15   Dominica Severin  Darrold Junker, MD  lidocaine-prilocaine (EMLA) cream Apply 1 application topically as needed. Apply 1 tablespoon to [facility] site and cover with plastic wrap 1 hour prior to chemo Patient not taking: Reported on 07/13/2015 06/15/15   Ladell Pier, MD   BP 151/70 mmHg  Pulse 82  Temp(Src) 97.9 F (36.6 C) (Oral)  Resp 16  SpO2 99% Physical Exam  Constitutional: He is oriented to person, place,  and time. He appears well-developed and well-nourished. No distress.  HENT:  Head: Normocephalic and atraumatic.  Eyes: Conjunctivae are normal.  Neck: Neck supple. No tracheal deviation present.  Cardiovascular: Normal rate, regular rhythm and normal heart sounds.   Pulmonary/Chest: Effort normal. No respiratory distress. He has no wheezes.  Abdominal: Soft. He exhibits no distension. There is no tenderness.  Non-bloody stool noted in ostomy bag, no tenderness of ostomy site  Neurological: He is alert and oriented to person, place, and time.  Skin: Skin is warm and dry.  Psychiatric: He has a normal mood and affect.  Vitals reviewed.   ED Course  Procedures (including critical care time) Labs Review Labs Reviewed  I-STAT CG4 LACTIC ACID, ED - Abnormal; Notable for the following:    Lactic Acid, Venous 2.92 (*)    All other components within normal limits  I-STAT CG4 LACTIC ACID, ED - Abnormal; Notable for the following:    Lactic Acid, Venous 0.33 (*)    All other components within normal limits  POC OCCULT BLOOD, ED    Imaging Review No results found. I have personally reviewed and evaluated these images and lab results as part of my medical decision-making.   EKG Interpretation None      MDM   Final diagnoses:  Chemotherapy induced diarrhea    81 y.o. male presents with increased ostomy output and possibly small blood in ostomy bag. Recently had chemotherapy. Labs from earlier today show stable hemoglobin from prior, no evidence of end-organ dysfunction. Pt well appearing, having no pain or other complaints. No blood in ostomy here. Given IVF boluses for mildly elevated lactate which clears, likely 2/2 dehydration with increasing output that can be attributed to recent cancer therapy. No imaging indicated currently. Plan to follow up with PCP as needed and return precautions discussed for worsening or new concerning symptoms.     Leo Grosser, MD 07/14/15 [phone]

## 2015-07-15 ENCOUNTER — Encounter: Payer: Self-pay | Admitting: Nurse Practitioner

## 2015-07-15 NOTE — Assessment & Plan Note (Signed)
Patient received his second cycle of FOLFOX chemotherapy on 07/10/2015.  Patient is scheduled to return on 07/24/2015 for labs, physical, and chemotherapy.

## 2015-07-15 NOTE — Assessment & Plan Note (Signed)
Patient suffers with chronic diarrhea; and states that he has been alternating both Imodium and Lomotil with minimal effectiveness.  He has noted increased diarrhea./Liquid stool in his colostomy bag within the past 24 hours.  He also reports some bright red blood mixed with the stool first thing this morning.  He denies any other new symptoms whatsoever.  He denies any recent fevers or chills.  On exam.-Patient's abdomen is soft, bowel sounds positive in all 4 quads, and abdomen nontender.  Left abdominal colostomy intact; with leaking stool on dressing.  There is reddish/brown liquid stool in the colostomy bag which test heme occult positive.  Patient has history of GI bleed in the past as well.  Due to apparent new GI bleed.-Patient will be transported to the emergency department for further evaluation and management.  Brief history.  Report called to the emergency department charge nurse prior to the patient being transported via wheelchair to the emergency department per the [place].

## 2015-07-15 NOTE — Progress Notes (Signed)
SYMPTOM MANAGEMENT CLINIC   HPI: Harold Price 81 y.o. male diagnosed with colon cancer.  Currently undergoing FOLFOX chemotherapy regimen.   Patient suffers with chronic diarrhea; and states that he has been alternating both Imodium and Lomotil with minimal effectiveness.  He has noted increased diarrhea./Liquid stool in his colostomy bag within the past 24 hours.  He also reports some bright red blood mixed with the stool first thing this morning.  He denies any other new symptoms whatsoever.  He denies any recent fevers or chills.  On exam.-Patient's abdomen is soft, bowel sounds positive in all 4 quads, and abdomen nontender.  Left abdominal colostomy intact; with leaking stool on dressing.  There is reddish/brown liquid stool in the colostomy bag which test heme occult positive.  Patient has history of GI bleed in the past as well.  Due to apparent new GI bleed.-Patient will be transported to the emergency department for further evaluation and management.  Brief history.  Report called to the emergency department charge nurse prior to the patient being transported via wheelchair to the emergency department per the [place].  HPI  ROS  Past Medical History  Diagnosis Date  . Hypertension   . PUD (peptic ulcer disease)     2006  . Depression   . Headache(784.0)   . Arthritis     general  . Alcohol abuse   . SBO (small bowel obstruction) ([place]) 04/2015  . Transfusion of blood product refused for religious reason   . Cancer [facility])     Past Surgical History  Procedure Laterality Date  . Cataract extraction Right   . Esophagogastroduodenoscopy N/A 11/06/2012    Procedure: ESOPHAGOGASTRODUODENOSCOPY (EGD);  Surgeon: Lear Ng, MD;  Location: [facility] ENDOSCOPY;  Service: Endoscopy;  Laterality: N/A;  . Esophagogastroduodenoscopy N/A 11/07/2012    Procedure: ESOPHAGOGASTRODUODENOSCOPY (EGD);  Surgeon: Lear Ng, MD;  Location: [facility] ENDOSCOPY;  Service:  Endoscopy;  Laterality: N/A;  . Laparotomy N/A 04/20/2015    Procedure: EXPLORATORY LAPAROTOMY WITH PARTIAL COLECTOMY;  Surgeon: Rolm Bookbinder, MD;  Location: [place];  Service: General;  Laterality: N/A;  Exploratory lap with right colectomy, small bowel and sigmoid resection, ileostomy  . Colostomy    . Portacath placement Right 06/20/2015    Procedure: INSERTION PORT-A-CATH WITH ULTRASOUND;  Surgeon: Rolm Bookbinder, MD;  Location: [place];  Service: General;  Laterality: Right;    has Acute GI bleeding; Acute blood loss anemia; Generalized weakness; Alcohol abuse; History of ulcer disease; Refusal of blood transfusions as patient is Jehov[employer]'s Witness; Acute upper GI bleed; Duodenal ulcer with hemorrhage; Small bowel obstruction ([place]); Hypertension; PUD (peptic ulcer disease); Depression; Small bowel mass; Essential hypertension; Leukocytosis; Hypokalemia; LVH (left ventricular hypertrophy); Hypophosphatemia; Atrial fibrillation with RVR ([place]); Septic shock ([place]); and Colon cancer ([place]) on his problem list.    has No Known Allergies.    Medication List       This list is accurate as of: 07/13/15  4:02 PM.  Always use your most recent med list.               calcium carbonate 500 MG chewable tablet  Commonly known as:  TUMS - dosed in mg elemental calcium  Chew 1 tablet by mouth 2 (two) times daily as needed for indigestion or heartburn.     diphenoxylate-atropine 2.5-0.025 MG tablet  Commonly known as:  LOMOTIL  TAKE 2 TABLETS BY MOUTH 3 TIMES A DAY AS NEEDED FOR DIARRHEA OR LOOSE STOOLS  HYDROcodone-acetaminophen 5-325 MG tablet  Commonly known as:  NORCO  Take 1 tablet by mouth every 6 (six) hours as needed for moderate pain.     lidocaine-prilocaine cream  Commonly known as:  EMLA  Apply 1 application topically as needed. Apply 1 tablespoon to [facility] site and cover with plastic wrap 1 hour prior to chemo     loperamide 2 MG capsule  Commonly known as:   IMODIUM  Take 2 mg by mouth 3 (three) times daily.     prochlorperazine 10 MG tablet  Commonly known as:  COMPAZINE  Take 1 tablet (10 mg total) by mouth every 6 (six) hours as needed for nausea.         PHYSICAL EXAMINATION  Oncology Vitals 07/13/2015 07/13/2015 07/13/2015 07/13/2015 07/13/2015 07/13/2015 07/12/2015  Height - - - - - 188 cm -  Weight - - - - - 91.445 kg -  Weight (lbs) - - - - - 201 lbs 10 oz -  BMI (kg/m2) - - - - - 25.88 kg/m2 -  Temp - - 97.9 - 98.5 97.5 98.1  Pulse 82 79 79 65 61 61 58  Resp $Rem'16 18 18 18 18 18 18  'rSRz$ SpO2 99 99 98 99 100 100 100  BSA (m2) - - - - - 2.18 m2 -   BP Readings from Last 3 Encounters:  07/13/15 151/70  07/13/15 125/55  07/12/15 125/52    Physical Exam  Constitutional: He is oriented to person, place, and time.  Patient appears fatigued and weak; as well as chronically ill.  HENT:  Head: Normocephalic and atraumatic.  Mouth/Throat: Oropharynx is clear and moist.  Eyes: Conjunctivae and EOM are normal. Pupils are equal, round, and reactive to light. Right eye exhibits no discharge. Left eye exhibits no discharge. No scleral icterus.  Neck: Normal range of motion. Neck supple. No JVD present. No tracheal deviation present. No thyromegaly present.  Cardiovascular: Normal rate, regular rhythm, normal heart sounds and intact distal pulses.   Pulmonary/Chest: Effort normal and breath sounds normal. No respiratory distress. He has no wheezes. He has no rales. He exhibits no tenderness.  Abdominal: Soft. Bowel sounds are normal. He exhibits no distension and no mass. There is no tenderness. There is no rebound and no guarding.  On exam.-Patient's abdomen is soft, bowel sounds positive in all 4 quads, and abdomen nontender.  Left abdominal colostomy intact; with leaking stool on dressing.  There is reddish/brown liquid stool in the colostomy bag which test heme occult positive.    Musculoskeletal: Normal range of motion. He exhibits no  edema or tenderness.  Lymphadenopathy:    He has no cervical adenopathy.  Neurological: He is alert and oriented to person, place, and time. Gait normal.  Skin: Skin is warm and dry. No rash noted. No erythema. No pallor.  Psychiatric: Affect normal.  Nursing note and vitals reviewed.   LABORATORY DATA:. Admission on 07/13/2015, Discharged on 07/13/2015  Component Date Value Ref Range Status  . Lactic Acid, Venous 07/13/2015 2.92* 0.5 - 2.0 mmol/L Final  . Comment 07/13/2015 NOTIFIED PHYSICIAN   Final  . Fecal Occult Bld 07/13/2015 NEGATIVE  NEGATIVE Final  . Lactic Acid, Venous 07/13/2015 0.33* 0.5 - 2.0 mmol/L Final  Hospital Outpatient Visit on 07/13/2015  Component Date Value Ref Range Status  . C Diff antigen 07/13/2015 NEGATIVE  NEGATIVE Final  . C Diff toxin 07/13/2015 NEGATIVE  NEGATIVE Final  . C Diff interpretation 07/13/2015 Negative for toxigenic C. difficile  Final  Appointment on 07/13/2015  Component Date Value Ref Range Status  . WBC 07/13/2015 4.2  4.0 - 10.3 10e3/uL Final  . NEUT# 07/13/2015 2.2  1.5 - 6.5 10e3/uL Final  . HGB 07/13/2015 10.1* 13.0 - 17.1 g/dL Final  . HCT 07/13/2015 32.4* 38.4 - 49.9 % Final  . Platelets 07/13/2015 161  140 - 400 10e3/uL Final  . MCV 07/13/2015 75.1* 79.3 - 98.0 fL Final  . MCH 07/13/2015 23.4* 27.2 - 33.4 pg Final  . MCHC 07/13/2015 31.1* 32.0 - 36.0 g/dL Final  . RBC 07/13/2015 4.32  4.20 - 5.82 10e6/uL Final  . RDW 07/13/2015 16.8* 11.0 - 14.6 % Final  . lymph# 07/13/2015 1.5  0.9 - 3.3 10e3/uL Final  . MONO# 07/13/2015 0.4  0.1 - 0.9 10e3/uL Final  . Eosinophils Absolute 07/13/2015 0.1  0.0 - 0.5 10e3/uL Final  . Basophils Absolute 07/13/2015 0.0  0.0 - 0.1 10e3/uL Final  . NEUT% 07/13/2015 51.6  39.0 - 75.0 % Final  . LYMPH% 07/13/2015 34.8  14.0 - 49.0 % Final  . MONO% 07/13/2015 9.6  0.0 - 14.0 % Final  . EOS% 07/13/2015 3.1  0.0 - 7.0 % Final  . BASO% 07/13/2015 0.9  0.0 - 2.0 % Final  . Magnesium 07/13/2015 2.3   1.5 - 2.5 mg/dl Final  . Sodium 07/13/2015 135* 136 - 145 mEq/L Final  . Potassium 07/13/2015 3.8  3.5 - 5.1 mEq/L Final  . Chloride 07/13/2015 104  98 - 109 mEq/L Final  . CO2 07/13/2015 26  22 - 29 mEq/L Final  . Glucose 07/13/2015 96  70 - 140 mg/dl Final   Glucose reference range is for nonfasting patients. Fasting glucose reference range is 70- 100.  Marland Kitchen BUN 07/13/2015 11.1  7.0 - 26.0 mg/dL Final  . Creatinine 07/13/2015 0.8  0.7 - 1.3 mg/dL Final  . Total Bilirubin 07/13/2015 0.65  0.20 - 1.20 mg/dL Final  . Alkaline Phosphatase 07/13/2015 58  40 - 150 U/L Final  . AST 07/13/2015 18  5 - 34 U/L Final  . ALT 07/13/2015 <9  0 - 55 U/L Final  . Total Protein 07/13/2015 6.1* 6.4 - 8.3 g/dL Final  . Albumin 07/13/2015 3.4* 3.5 - 5.0 g/dL Final  . Calcium 07/13/2015 9.3  8.4 - 10.4 mg/dL Final  . Anion Gap 07/13/2015 5  3 - 11 mEq/L Final  . EGFR 07/13/2015 >90  >90 ml/min/1.73 m2 Final   eGFR is calculated using the CKD-EPI Creatinine Equation (2009)     RADIOGRAPHIC STUDIES: No results found.  ASSESSMENT/PLAN:    Acute GI bleeding Patient suffers with chronic diarrhea; and states that he has been alternating both Imodium and Lomotil with minimal effectiveness.  He has noted increased diarrhea./Liquid stool in his colostomy bag within the past 24 hours.  He also reports some bright red blood mixed with the stool first thing this morning.  He denies any other new symptoms whatsoever.  He denies any recent fevers or chills.  On exam.-Patient's abdomen is soft, bowel sounds positive in all 4 quads, and abdomen nontender.  Left abdominal colostomy intact; with leaking stool on dressing.  There is reddish/brown liquid stool in the colostomy bag which test heme occult positive.  Patient has history of GI bleed in the past as well.  Due to apparent new GI bleed.-Patient will be transported to the emergency department for further evaluation and management.  Brief history.  Report called to the  emergency department charge  nurse prior to the patient being transported via wheelchair to the emergency department per the [place].  Colon cancer [facility]) Patient received his second cycle of FOLFOX chemotherapy on 07/10/2015.  Patient is scheduled to return on 07/24/2015 for labs, physical, and chemotherapy.  Patient stated understanding of all instructions; and was in agreement with this plan of care. The patient knows to call the clinic with any problems, questions or concerns.   Review/collaboration with Dr. Lindi Adie (on call) regarding all aspects of patient's visit today.   Total time spent with patient was 25 minutes;  with greater than 75 percent of that time spent in face to face counseling regarding patient's symptoms,  and coordination of care and follow up.  Disclaimer:This dictation was prepared with Dragon/digital dictation along with [employer]. Any transcriptional errors that result from this process are unintentional.  Drue Second, NP 07/15/2015

## 2015-07-16 ENCOUNTER — Telehealth: Payer: Self-pay | Admitting: *Deleted

## 2015-07-16 ENCOUNTER — Ambulatory Visit (HOSPITAL_BASED_OUTPATIENT_CLINIC_OR_DEPARTMENT_OTHER): Payer: Medicare Other | Admitting: Nurse Practitioner

## 2015-07-16 ENCOUNTER — Ambulatory Visit (HOSPITAL_BASED_OUTPATIENT_CLINIC_OR_DEPARTMENT_OTHER): Payer: Medicare Other

## 2015-07-16 ENCOUNTER — Encounter: Payer: Self-pay | Admitting: Nurse Practitioner

## 2015-07-16 VITALS — BP 129/57 | HR 67 | Temp 98.5°F | Resp 19 | Ht 74.0 in | Wt 204.1 lb

## 2015-07-16 DIAGNOSIS — R197 Diarrhea, unspecified: Secondary | ICD-10-CM | POA: Diagnosis present

## 2015-07-16 DIAGNOSIS — C182 Malignant neoplasm of ascending colon: Secondary | ICD-10-CM

## 2015-07-16 DIAGNOSIS — K921 Melena: Secondary | ICD-10-CM

## 2015-07-16 DIAGNOSIS — K922 Gastrointestinal hemorrhage, unspecified: Secondary | ICD-10-CM

## 2015-07-16 DIAGNOSIS — C172 Malignant neoplasm of ileum: Secondary | ICD-10-CM | POA: Diagnosis not present

## 2015-07-16 DIAGNOSIS — C189 Malignant neoplasm of colon, unspecified: Secondary | ICD-10-CM

## 2015-07-16 LAB — CBC WITH DIFFERENTIAL/PLATELET
BASO%: 0.8 % (ref 0.0–2.0)
Basophils Absolute: 0 10*3/uL (ref 0.0–0.1)
EOS%: 4.7 % (ref 0.0–7.0)
Eosinophils Absolute: 0.1 10*3/uL (ref 0.0–0.5)
HCT: 30.4 % — ABNORMAL LOW (ref 38.4–49.9)
HGB: 9.6 g/dL — ABNORMAL LOW (ref 13.0–17.1)
LYMPH%: 35.4 % (ref 14.0–49.0)
MCH: 23.5 pg — ABNORMAL LOW (ref 27.2–33.4)
MCHC: 31.5 g/dL — AB (ref 32.0–36.0)
MCV: 74.7 fL — ABNORMAL LOW (ref 79.3–98.0)
MONO#: 0.3 10*3/uL (ref 0.1–0.9)
MONO%: 11.2 % (ref 0.0–14.0)
NEUT%: 47.9 % (ref 39.0–75.0)
NEUTROS ABS: 1.3 10*3/uL — AB (ref 1.5–6.5)
Platelets: 166 10*3/uL (ref 140–400)
RBC: 4.08 10*6/uL — AB (ref 4.20–5.82)
RDW: 16.6 % — ABNORMAL HIGH (ref 11.0–14.6)
WBC: 2.8 10*3/uL — AB (ref 4.0–10.3)
lymph#: 1 10*3/uL (ref 0.9–3.3)

## 2015-07-16 LAB — COMPREHENSIVE METABOLIC PANEL (CC13)
ANION GAP: 4 meq/L (ref 3–11)
AST: 15 U/L (ref 5–34)
Albumin: 3.1 g/dL — ABNORMAL LOW (ref 3.5–5.0)
Alkaline Phosphatase: 57 U/L (ref 40–150)
BILIRUBIN TOTAL: 0.31 mg/dL (ref 0.20–1.20)
BUN: 7.1 mg/dL (ref 7.0–26.0)
CALCIUM: 9.2 mg/dL (ref 8.4–10.4)
CHLORIDE: 107 meq/L (ref 98–109)
CO2: 28 meq/L (ref 22–29)
CREATININE: 0.8 mg/dL (ref 0.7–1.3)
EGFR: >90 mL/min/{1.73_m2} (ref >90)
Glucose: 110 mg/dl (ref 70–140)
POTASSIUM: 3.8 meq/L (ref 3.5–5.1)
Sodium: 139 mEq/L (ref 136–145)
Total Protein: 5.8 g/dL — ABNORMAL LOW (ref 6.4–8.3)

## 2015-07-16 LAB — HOLD TUBE, BLOOD BANK

## 2015-07-16 MED ORDER — DIPHENOXYLATE-ATROPINE 2.5-0.025 MG PO TABS: 2.0000 | ORAL_TABLET | Freq: Four times a day (QID) | ORAL | Status: DC | PRN

## 2015-07-16 NOTE — Progress Notes (Signed)
SYMPTOM MANAGEMENT CLINIC   HPI: Harold Price 81 y.o. male diagnosed with colon cancer.  Patient is currently undergoing FOLFOX chemotherapy regimen.  Patient returned to the cancer Center today with complaint of continued diarrhea into his colostomy bag.  He states that the stool content is continually changing color; but he has noticed no blood in the stool within the past 48 hours.  He has been taken an excessive amount of both Imodium and Lomotil with only moderate effectiveness.  He denies any other GI symptoms; and denies any recent fevers or chills.   HPI  ROS  Past Medical History  Diagnosis Date  . Hypertension   . PUD (peptic ulcer disease)     2006  . Depression   . Headache(784.0)   . Arthritis     general  . Alcohol abuse   . SBO (small bowel obstruction) (HCC) 04/2015  . Transfusion of blood product refused for religious reason   . Cancer [facility])     Past Surgical History  Procedure Laterality Date  . Cataract extraction Right   . Esophagogastroduodenoscopy N/A 11/06/2012    Procedure: ESOPHAGOGASTRODUODENOSCOPY (EGD);  Surgeon: Shirley Friar, MD;  Location: [facility] ENDOSCOPY;  Service: Endoscopy;  Laterality: N/A;  . Esophagogastroduodenoscopy N/A 11/07/2012    Procedure: ESOPHAGOGASTRODUODENOSCOPY (EGD);  Surgeon: Shirley Friar, MD;  Location: [facility] ENDOSCOPY;  Service: Endoscopy;  Laterality: N/A;  . Laparotomy N/A 04/20/2015    Procedure: EXPLORATORY LAPAROTOMY WITH PARTIAL COLECTOMY;  Surgeon: Emelia Loron, MD;  Location: [facility] OR;  Service: General;  Laterality: N/A;  Exploratory lap with right colectomy, small bowel and sigmoid resection, ileostomy  . Colostomy    . Portacath placement Right 06/20/2015    Procedure: INSERTION PORT-A-CATH WITH ULTRASOUND;  Surgeon: Emelia Loron, MD;  Location: [facility];  Service: General;  Laterality: Right;    has Acute GI bleeding; Acute blood loss anemia; Generalized weakness; Alcohol abuse;  History of ulcer disease; Refusal of blood transfusions as patient is Jehovah's Witness; Acute upper GI bleed; Duodenal ulcer with hemorrhage; Small bowel obstruction (HCC); Hypertension; PUD (peptic ulcer disease); Depression; Small bowel mass; Essential hypertension; Leukocytosis; Hypokalemia; LVH (left ventricular hypertrophy); Hypophosphatemia; Atrial fibrillation with RVR (HCC); Septic shock (HCC); and Colon cancer (HCC) on his problem list.    has No Known Allergies.    Medication List       This list is accurate as of: 07/16/15  2:45 PM.  Always use your most recent med list.               calcium carbonate 500 MG chewable tablet  Commonly known as:  TUMS - dosed in mg elemental calcium  Chew 1 tablet by mouth 2 (two) times daily as needed for indigestion or heartburn.     diphenoxylate-atropine 2.5-0.025 MG tablet  Commonly known as:  LOMOTIL  Take 2 tablets by mouth 4 (four) times daily as needed for diarrhea or loose stools.     HYDROcodone-acetaminophen 5-325 MG tablet  Commonly known as:  NORCO  Take 1 tablet by mouth every 6 (six) hours as needed for moderate pain.     lidocaine-prilocaine cream  Commonly known as:  EMLA  Apply 1 application topically as needed. Apply 1 tablespoon to [facility] site and cover with plastic wrap 1 hour prior to chemo     loperamide 2 MG capsule  Commonly known as:  IMODIUM  Take 2 mg by mouth 3 (three) times daily.     prochlorperazine 10  MG tablet  Commonly known as:  COMPAZINE  Take 1 tablet (10 mg total) by mouth every 6 (six) hours as needed for nausea.         PHYSICAL EXAMINATION  Oncology Vitals 07/16/2015 07/13/2015 07/13/2015 07/13/2015 07/13/2015 07/13/2015 07/13/2015  Height 188 cm - - - - - 188 cm  Weight 92.579 kg - - - - - 91.445 kg  Weight (lbs) 204 lbs 2 oz - - - - - 201 lbs 10 oz  BMI (kg/m2) 26.2 kg/m2 - - - - - 25.88 kg/m2  Temp 98.5 - - 97.9 - 98.5 97.5  Pulse 67 82 79 79 65 61 61  Resp $Rem'19 16 18 18 18 18 18    'Woph$ SpO2 100 99 99 98 99 100 100  BSA (m2) 2.2 m2 - - - - - 2.18 m2   BP Readings from Last 3 Encounters:  07/16/15 129/57  07/13/15 151/70  07/13/15 125/55    Physical Exam  Constitutional: He is oriented to person, place, and time and well-developed, well-nourished, and in no distress.  HENT:  Head: Normocephalic and atraumatic.  Mouth/Throat: Oropharynx is clear and moist.  Eyes: Conjunctivae and EOM are normal. Pupils are equal, round, and reactive to light. Right eye exhibits no discharge. Left eye exhibits no discharge. No scleral icterus.  Neck: Normal range of motion. Neck supple. No JVD present. No tracheal deviation present. No thyromegaly present.  Cardiovascular: Normal rate, regular rhythm, normal heart sounds and intact distal pulses.   Pulmonary/Chest: Effort normal and breath sounds normal. No respiratory distress. He has no wheezes. He has no rales. He exhibits no tenderness.  Abdominal: Soft. Bowel sounds are normal. He exhibits no distension and no mass. There is no tenderness. There is no rebound and no guarding.  Left abdominal colostomy bag intact with yellow diarrhea stool.  Stool is Hemoccult-negative.  Musculoskeletal: Normal range of motion. He exhibits no edema or tenderness.  Lymphadenopathy:    He has no cervical adenopathy.  Neurological: He is alert and oriented to person, place, and time. Gait normal.  Skin: Skin is warm and dry. No rash noted. No erythema. No pallor.  Psychiatric: Affect normal.  Nursing note and vitals reviewed.   LABORATORY DATA:. Appointment on 07/16/2015  Component Date Value Ref Range Status  . WBC 07/16/2015 2.8* 4.0 - 10.3 10e3/uL Final  . NEUT# 07/16/2015 1.3* 1.5 - 6.5 10e3/uL Final  . HGB 07/16/2015 9.6* 13.0 - 17.1 g/dL Final  . HCT 07/16/2015 30.4* 38.4 - 49.9 % Final  . Platelets 07/16/2015 166  140 - 400 10e3/uL Final  . MCV 07/16/2015 74.7* 79.3 - 98.0 fL Final  . MCH 07/16/2015 23.5* 27.2 - 33.4 pg Final  . MCHC  07/16/2015 31.5* 32.0 - 36.0 g/dL Final  . RBC 07/16/2015 4.08* 4.20 - 5.82 10e6/uL Final  . RDW 07/16/2015 16.6* 11.0 - 14.6 % Final  . lymph# 07/16/2015 1.0  0.9 - 3.3 10e3/uL Final  . MONO# 07/16/2015 0.3  0.1 - 0.9 10e3/uL Final  . Eosinophils Absolute 07/16/2015 0.1  0.0 - 0.5 10e3/uL Final  . Basophils Absolute 07/16/2015 0.0  0.0 - 0.1 10e3/uL Final  . NEUT% 07/16/2015 47.9  39.0 - 75.0 % Final  . LYMPH% 07/16/2015 35.4  14.0 - 49.0 % Final  . MONO% 07/16/2015 11.2  0.0 - 14.0 % Final  . EOS% 07/16/2015 4.7  0.0 - 7.0 % Final  . BASO% 07/16/2015 0.8  0.0 - 2.0 % Final  . Hold  Tube, Blood Bank 07/16/2015 Blood Bank Order Cancelled   Final  . Sodium 07/16/2015 139  136 - 145 mEq/L Final  . Potassium 07/16/2015 3.8  3.5 - 5.1 mEq/L Final  . Chloride 07/16/2015 107  98 - 109 mEq/L Final  . CO2 07/16/2015 28  22 - 29 mEq/L Final  . Glucose 07/16/2015 110  70 - 140 mg/dl Final   Glucose reference range is for nonfasting patients. Fasting glucose reference range is 70- 100.  Marland Kitchen BUN 07/16/2015 7.1  7.0 - 26.0 mg/dL Final  . Creatinine 07/16/2015 0.8  0.7 - 1.3 mg/dL Final  . Total Bilirubin 07/16/2015 0.31  0.20 - 1.20 mg/dL Final  . Alkaline Phosphatase 07/16/2015 57  40 - 150 U/L Final  . AST 07/16/2015 15  5 - 34 U/L Final  . ALT 07/16/2015 <9  0 - 55 U/L Final  . Total Protein 07/16/2015 5.8* 6.4 - 8.3 g/dL Final  . Albumin 07/16/2015 3.1* 3.5 - 5.0 g/dL Final  . Calcium 07/16/2015 9.2  8.4 - 10.4 mg/dL Final  . Anion Gap 07/16/2015 4  3 - 11 mEq/L Final  . EGFR 07/16/2015 >90  >90 ml/min/1.73 m2 Final   eGFR is calculated using the CKD-EPI Creatinine Equation (2009)     RADIOGRAPHIC STUDIES: No results found.  ASSESSMENT/PLAN:    Acute GI bleeding Patient has been experiencing chronic diarrhea into his colostomy bag since initiating chemotherapy.  He was seen in the symptom management clinic last week for Hemoccult-positive diarrhea.  Stool for his colostomy bag.  He was  transferred to the emergency department for further evaluation at that time.  The emergency department records indicate that patient had emptied his colostomy bag; and there was no further bloody stool obtained.  Patient was allowed to return home.  Patient reports that he has been increasingly fatigued over this past weekend; and continues with diarrhea.  However, patient states there is no further blood in his stool.  He also denies any other GI symptoms whatsoever.  He denies any recent fevers or chills.  He reports that he has been taking an excessive amount of both Imodium and Lomotil-up to 4-6 tablets at a time with only moderate effectiveness.  C. difficile stool obtained on October 28 was negative.  On exam today.  Patient with soft abdomen; that is nontender to palpation.  Bowel sounds remain positive in all 4 quads.  Colostomy is now putting out a bright yellow diarrhea/liquid stool which is negative for blood.  Offered IV fluid rehydration to the patient; the patient's felt that he could drink plenty of fluids at home.  He was encouraged to push fluids even further if possible.  Patient was advised to call/return or go directly to the emergency department for any worsening symptoms whatsoever.    Colon cancer [facility]) Patient received his second cycle of FOLFOX chemotherapy on 07/10/2015.  Patient has been experiencing chronic diarrhea into his colostomy bag since initiating chemotherapy.  He was seen in the symptom management clinic last week for Hemoccult-positive diarrhea.  Stool for his colostomy bag.  He was transferred to the emergency department for further evaluation at that time.  The emergency department records indicate that patient had emptied his colostomy bag; and there was no further bloody stool obtained.  Patient was allowed to return home.  Patient reports that he has been increasingly fatigued over this past weekend; and continues with diarrhea.  However, patient  states there is no further blood in his stool.  He also denies any other GI symptoms whatsoever.  He denies any recent fevers or chills.  He reports that he has been taking an excessive amount of both Imodium and Lomotil-up to 4-6 tablets at a time with only moderate effectiveness.  C. difficile stool obtained on October 28 was negative.  On exam today.  Patient with soft abdomen; that is nontender to palpation.  Bowel sounds remain positive in all 4 quads.  Colostomy is now putting out a bright yellow diarrhea/liquid stool which is negative for blood.  Offered IV fluid rehydration to the patient; the patient's felt that he could drink plenty of fluids at home.  He was encouraged to push fluids even further if possible.  Patient was advised to call/return or go directly to the emergency department for any worsening symptoms whatsoever.  Patient is scheduled to return on 07/24/2015 for labs, physical, and chemotherapy.    Patient stated understanding of all instructions; and was in agreement with this plan of care. The patient knows to call the clinic with any problems, questions or concerns.   Review/collaboration with Dr. Benay Spice regarding all aspects of patient's visit today.   Total time spent with patient was 25 minutes;  with greater than 75 percent of that time spent in face to face counseling regarding patient's symptoms,  and coordination of care and follow up.  Disclaimer:This dictation was prepared with Dragon/digital dictation along with [employer]. Any transcriptional errors that result from this process are unintentional.  Drue Second, NP 07/16/2015

## 2015-07-16 NOTE — Assessment & Plan Note (Signed)
Patient has been experiencing chronic diarrhea into his colostomy bag since initiating chemotherapy.  He was seen in the symptom management clinic last week for Hemoccult-positive diarrhea.  Stool for his colostomy bag.  He was transferred to the emergency department for further evaluation at that time.  The emergency department records indicate that patient had emptied his colostomy bag; and there was no further bloody stool obtained.  Patient was allowed to return home.  Patient reports that he has been increasingly fatigued over this past weekend; and continues with diarrhea.  However, patient states there is no further blood in his stool.  He also denies any other GI symptoms whatsoever.  He denies any recent fevers or chills.  He reports that he has been taking an excessive amount of both Imodium and Lomotil-up to 4-6 tablets at a time with only moderate effectiveness.  C. difficile stool obtained on October 28 was negative.  On exam today.  Patient with soft abdomen; that is nontender to palpation.  Bowel sounds remain positive in all 4 quads.  Colostomy is now putting out a bright yellow diarrhea/liquid stool which is negative for blood.  Offered IV fluid rehydration to the patient; the patient's felt that he could drink plenty of fluids at home.  He was encouraged to push fluids even further if possible.  Patient was advised to call/return or go directly to the emergency department for any worsening symptoms whatsoever.

## 2015-07-16 NOTE — Telephone Encounter (Signed)
TC to pt to check status  Ran out of Lomotil 4 tabs 4 times a day. Immodium 4 tabs 4 times a day. If he stopped the diarrhea came back. Stool reported to be blackish green soft, thin consistency. Denies any pain. He has been sleeping all weekend other than a few periods of wakefulness. He has been fatigued most of the time. He states he thinks the coffee he is drinking is making his stool black.

## 2015-07-16 NOTE — Telephone Encounter (Signed)
Spoke to Ross Stores, NP about pt report of medication use and quality of stool today and this past weekend. Advised to have pt RTC today with labs, possible blood transfusion. POF and labs ordered. Pt aware of appt today at 11 for labs and 1130 to see [facility].

## 2015-07-16 NOTE — Assessment & Plan Note (Signed)
Patient received his second cycle of FOLFOX chemotherapy on 07/10/2015.  Patient has been experiencing chronic diarrhea into his colostomy bag since initiating chemotherapy.  He was seen in the symptom management clinic last week for Hemoccult-positive diarrhea.  Stool for his colostomy bag.  He was transferred to the emergency department for further evaluation at that time.  The emergency department records indicate that patient had emptied his colostomy bag; and there was no further bloody stool obtained.  Patient was allowed to return home.  Patient reports that he has been increasingly fatigued over this past weekend; and continues with diarrhea.  However, patient states there is no further blood in his stool.  He also denies any other GI symptoms whatsoever.  He denies any recent fevers or chills.  He reports that he has been taking an excessive amount of both Imodium and Lomotil-up to 4-6 tablets at a time with only moderate effectiveness.  C. difficile stool obtained on October 28 was negative.  On exam today.  Patient with soft abdomen; that is nontender to palpation.  Bowel sounds remain positive in all 4 quads.  Colostomy is now putting out a bright yellow diarrhea/liquid stool which is negative for blood.  Offered IV fluid rehydration to the patient; the patient's felt that he could drink plenty of fluids at home.  He was encouraged to push fluids even further if possible.  Patient was advised to call/return or go directly to the emergency department for any worsening symptoms whatsoever.  Patient is scheduled to return on 07/24/2015 for labs, physical, and chemotherapy.

## 2015-07-18 ENCOUNTER — Telehealth: Payer: Self-pay | Admitting: *Deleted

## 2015-07-18 NOTE — Telephone Encounter (Signed)
Oncology Nurse Navigator Documentation  Oncology Nurse Navigator Flowsheets 07/18/2015  Referral date to RadOnc/MedOnc -  Navigator Encounter Type Routine follow up  Patient Visit Type -  Treatment Phase Treatment  Barriers/Navigation Needs Family concerns  Education  Symptom Management-loose stools/ostomy  Interventions Reinforced taking Lomotil #2 qid alternate with Imodium #2 qid  Referrals -  Education Method Verbal  Support Groups/Services -  Specialty Items/DME -  Time Spent with Patient 10  Says his stool is still liquid, but has some solids at times. Ostomy bag is staying on for several days now that he has the powder. Tells RN he is drinking 2 quarts of fluids/day. Strongly encourage him to try to increase this even more.

## 2015-07-22 ENCOUNTER — Other Ambulatory Visit: Payer: Self-pay | Admitting: Oncology

## 2015-07-24 ENCOUNTER — Ambulatory Visit (HOSPITAL_BASED_OUTPATIENT_CLINIC_OR_DEPARTMENT_OTHER): Payer: Medicare Other

## 2015-07-24 ENCOUNTER — Ambulatory Visit: Payer: Medicare Other

## 2015-07-24 ENCOUNTER — Encounter: Payer: Self-pay | Admitting: *Deleted

## 2015-07-24 ENCOUNTER — Other Ambulatory Visit (HOSPITAL_BASED_OUTPATIENT_CLINIC_OR_DEPARTMENT_OTHER): Payer: Medicare Other

## 2015-07-24 ENCOUNTER — Ambulatory Visit: Payer: Medicare Other | Admitting: Nutrition

## 2015-07-24 ENCOUNTER — Ambulatory Visit (HOSPITAL_BASED_OUTPATIENT_CLINIC_OR_DEPARTMENT_OTHER): Payer: Medicare Other | Admitting: Nurse Practitioner

## 2015-07-24 VITALS — BP 143/59 | HR 69 | Temp 97.6°F | Resp 17 | Ht 74.0 in | Wt 198.8 lb

## 2015-07-24 DIAGNOSIS — Z5111 Encounter for antineoplastic chemotherapy: Secondary | ICD-10-CM | POA: Diagnosis present

## 2015-07-24 DIAGNOSIS — C180 Malignant neoplasm of cecum: Secondary | ICD-10-CM

## 2015-07-24 DIAGNOSIS — C189 Malignant neoplasm of colon, unspecified: Secondary | ICD-10-CM

## 2015-07-24 DIAGNOSIS — D508 Other iron deficiency anemias: Secondary | ICD-10-CM | POA: Diagnosis not present

## 2015-07-24 DIAGNOSIS — Z95828 Presence of other vascular implants and grafts: Secondary | ICD-10-CM

## 2015-07-24 LAB — COMPREHENSIVE METABOLIC PANEL (CC13)
ALBUMIN: 3.4 g/dL — AB (ref 3.5–5.0)
ALK PHOS: 58 U/L (ref 40–150)
AST: 16 U/L (ref 5–34)
Anion Gap: 7 mEq/L (ref 3–11)
BUN: 6.9 mg/dL — AB (ref 7.0–26.0)
CALCIUM: 9.3 mg/dL (ref 8.4–10.4)
CO2: 23 mEq/L (ref 22–29)
CREATININE: 1 mg/dL (ref 0.7–1.3)
Chloride: 106 mEq/L (ref 98–109)
EGFR: 84 mL/min/{1.73_m2} — ABNORMAL LOW (ref >90)
Glucose: 121 mg/dl (ref 70–140)
Potassium: 3.7 mEq/L (ref 3.5–5.1)
Sodium: 137 mEq/L (ref 136–145)
Total Bilirubin: 0.72 mg/dL (ref 0.20–1.20)
Total Protein: 6.1 g/dL — ABNORMAL LOW (ref 6.4–8.3)

## 2015-07-24 LAB — CBC WITH DIFFERENTIAL/PLATELET
BASO%: 0.5 % (ref 0.0–2.0)
Basophils Absolute: 0 10*3/uL (ref 0.0–0.1)
EOS%: 1.9 % (ref 0.0–7.0)
Eosinophils Absolute: 0.1 10*3/uL (ref 0.0–0.5)
HEMATOCRIT: 31.1 % — AB (ref 38.4–49.9)
HEMOGLOBIN: 9.9 g/dL — AB (ref 13.0–17.1)
LYMPH#: 1.2 10*3/uL (ref 0.9–3.3)
LYMPH%: 22.6 % (ref 14.0–49.0)
MCH: 23.7 pg — ABNORMAL LOW (ref 27.2–33.4)
MCHC: 31.9 g/dL — ABNORMAL LOW (ref 32.0–36.0)
MCV: 74.4 fL — ABNORMAL LOW (ref 79.3–98.0)
MONO#: 0.5 10*3/uL (ref 0.1–0.9)
MONO%: 9.5 % (ref 0.0–14.0)
NEUT#: 3.6 10*3/uL (ref 1.5–6.5)
NEUT%: 65.5 % (ref 39.0–75.0)
Platelets: 122 10*3/uL — ABNORMAL LOW (ref 140–400)
RBC: 4.18 10*6/uL — ABNORMAL LOW (ref 4.20–5.82)
RDW: 16.6 % — AB (ref 11.0–14.6)
WBC: 5.5 10*3/uL (ref 4.0–10.3)

## 2015-07-24 MED ORDER — SODIUM CHLORIDE 0.9 % IJ SOLN
10.0000 mL | INTRAMUSCULAR | Status: DC | PRN
Start: 2015-07-24 — End: 2015-07-24
  Administered 2015-07-24: 10 mL via INTRAVENOUS
  Filled 2015-07-24: qty 10

## 2015-07-24 MED ORDER — SODIUM CHLORIDE 0.9 % IV SOLN
Freq: Once | INTRAVENOUS | Status: AC
  Administered 2015-07-24: 11:00:00 via INTRAVENOUS
  Filled 2015-07-24: qty 4

## 2015-07-24 MED ORDER — DEXTROSE 5 % IV SOLN
Freq: Once | INTRAVENOUS | Status: AC
  Administered 2015-07-24: 11:00:00 via INTRAVENOUS

## 2015-07-24 MED ORDER — DIPHENOXYLATE-ATROPINE 2.5-0.025 MG PO TABS: 2.0000 | ORAL_TABLET | Freq: Four times a day (QID) | ORAL | Status: DC | PRN

## 2015-07-24 MED ORDER — HEPARIN SOD (PORK) LOCK FLUSH 100 UNIT/ML IV SOLN
500.0000 [IU] | Freq: Once | INTRAVENOUS | Status: DC | PRN
  Filled 2015-07-24: qty 5

## 2015-07-24 MED ORDER — FLUOROURACIL CHEMO INJECTION 2.5 GM/50ML
300.0000 mg/m2 | Freq: Once | INTRAVENOUS | Status: AC
  Administered 2015-07-24: 650 mg via INTRAVENOUS
  Filled 2015-07-24: qty 13

## 2015-07-24 MED ORDER — OXALIPLATIN CHEMO INJECTION 100 MG/20ML
85.0000 mg/m2 | Freq: Once | INTRAVENOUS | Status: AC
  Administered 2015-07-24: 185 mg via INTRAVENOUS
  Filled 2015-07-24: qty 37

## 2015-07-24 MED ORDER — LEUCOVORIN CALCIUM INJECTION 350 MG
300.0000 mg/m2 | Freq: Once | INTRAVENOUS | Status: AC
  Administered 2015-07-24: 648 mg via INTRAVENOUS
  Filled 2015-07-24: qty 32.4

## 2015-07-24 MED ORDER — SODIUM CHLORIDE 0.9 % IJ SOLN
10.0000 mL | INTRAMUSCULAR | Status: DC | PRN
  Filled 2015-07-24: qty 10

## 2015-07-24 MED ORDER — SODIUM CHLORIDE 0.9 % IV SOLN
1800.0000 mg/m2 | INTRAVENOUS | Status: DC
  Administered 2015-07-24: 3900 mg via INTRAVENOUS
  Filled 2015-07-24: qty 78

## 2015-07-24 NOTE — Patient Instructions (Signed)
FERROUS SULFATE 325 MG TWICE DAILY

## 2015-07-24 NOTE — Patient Instructions (Signed)
[facility] Discharge Instructions for Patients Receiving Chemotherapy  Today you received the following chemotherapy agents; Oxaliplatin, Leucovorin and Fluorouracil.   To help prevent nausea and vomiting after your treatment, we encourage you to take your nausea medication as directed.    If you develop nausea and vomiting that is not controlled by your nausea medication, call the clinic.   BELOW ARE SYMPTOMS THAT SHOULD BE REPORTED IMMEDIATELY:  *FEVER GREATER THAN 100.5 F  *CHILLS WITH OR WITHOUT FEVER  NAUSEA AND VOMITING THAT IS NOT CONTROLLED WITH YOUR NAUSEA MEDICATION  *UNUSUAL SHORTNESS OF BREATH  *UNUSUAL BRUISING OR BLEEDING  TENDERNESS IN MOUTH AND THROAT WITH OR WITHOUT PRESENCE OF ULCERS  *URINARY PROBLEMS  *BOWEL PROBLEMS  UNUSUAL RASH Items with * indicate a potential emergency and should be followed up as soon as possible.  Feel free to call the clinic you have any questions or concerns. The clinic phone number is [phone].  Please show the CHEMO ALERT CARD at check-in to the Emergency Department and triage nurse.   

## 2015-07-24 NOTE — Progress Notes (Signed)
Nutrition follow-up completed with patient during infusion for colon cancer. Patient reports his appetite has improved and he continues to eat well. Weight increased and documented as 204.1 pounds October 31.  Increased from 200.8 pounds October 25. Patient is not consistently consuming oral nutrition supplements. Patient reports good fluid intake. Reports stool from ileostomy is appropriate and thicker. Patient denies nutrition impact symptoms.  Nutrition diagnosis: Unintended weight loss resolved.  Encouraged patient to continue adequate calories and protein to promote weight maintenance. Recommended patient add oral nutrition supplements if oral intake decreases. Encouraged patient to contact me with questions or concerns. No follow-up is scheduled. Please reconsult if any nutrition needs arise.  **Disclaimer: This note was dictated with voice recognition software. Similar sounding words can inadvertently be transcribed and this note may contain transcription errors which may not have been corrected upon publication of note.**

## 2015-07-24 NOTE — Progress Notes (Signed)
  [place] OFFICE PROGRESS NOTE   Diagnosis:  Colon cancer   INTERVAL HISTORY:   Harold Price returns as scheduled. He completed cycle 2 FOLFOX 07/10/2015. He was seen in the emergency department 07/13/2015 for evaluation of diarrhea, possible blood in ostomy bag. He received intravenous hydration. Stool was negative for C. difficile. No blood was noted in the ostomy bag in the emergency department.  He notes improvement in the diarrhea. He is taking 2 tablets of Lomotil and Imodium 4 times a day. He estimates emptying the ostomy bag 3-4 times a day. No significant nausea/vomiting. No abdominal pain. No numbness or tingling in his hands or feet. No mouth sores. He denies any blood in the ostomy bag. He reports good oral fluid intake.  Objective:  Vital signs in last 24 hours:  Blood pressure 143/59, pulse 69, temperature 97.6 F (36.4 C), temperature source Oral, resp. rate 17, height _0  (1.88 m), weight 198 lb 12.8 oz (90.175 kg), SpO2 100 %.    HEENT: No thrush or ulcers. Resp: Lungs clear bilaterally. Cardio: Regular rate and rhythm. GI: Abdomen soft and nontender. No hepatomegaly. Right lower quadrant ileostomy with semi-formed stool in the collection bag. Vascular: Trace lower leg edema bilaterally. Port-A-Cath without erythema.  Lab Results:  Lab Results  Component Value Date   WBC 5.5 07/24/2015   HGB 9.9* 07/24/2015   HCT 31.1* 07/24/2015   MCV 74.4* 07/24/2015   PLT 122* 07/24/2015   NEUTROABS 3.6 07/24/2015    Imaging:  No results found.  Medications: I have reviewed the patient's current medications.  Assessment/Plan: 1. 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 Bleeding duodenal ulcer February 2014  3. History of heavy alcohol use  4. Enterobacter bacteremia following surgery August 2016  5. Microcytic anemia secondary to #1. He will begin ferrous sulfate 325 mg twice daily.  6. CT chest 05/15/2015-negative for metastatic disease, fatty mass at the left chest wall-low-grade neoplasm not excluded  7. Port-A-Cath placement 06/20/2015     Disposition: Harold Price appears stable. He has completed 2 cycles of FOLFOX. Plan to proceed with cycle 3 today as scheduled.  The diarrhea is controlled with a combination of Lomotil and Imodium. He will continue the same. He understands to contact the office with increased output.  He has a microcytic anemia. He will begin ferrous sulfate 325 mg twice daily.  He will return for a follow-up visit and cycle 4 FOLFOX in 2 weeks. He will contact the office in the interim with any problems.  Plan reviewed with Dr. Benay Spice.    Ned Card ANP/GNP-BC   07/24/2015  10:56 AM

## 2015-07-24 NOTE — Progress Notes (Signed)
Oncology Nurse Navigator Documentation  Oncology Nurse Navigator Flowsheets 07/24/2015  Referral date to RadOnc/MedOnc -  Navigator Encounter Type Treatment  Patient Visit Type Medonc  Treatment Phase Treatment # 3 FOLFOX  Barriers/Navigation Needs Family concerns--How to reorder ostomy supplies  Education Instructed to bring his forms in from supply company and RN will review with him to determine where to reorder from. Says home nurse got supplies from a company other than [employer].  Interventions Education Method  Referrals -  Education Method Verbal;Teach-back  Support Groups/Services -  Specialty Items/DME [place] supplies--discussed reorder  Time Spent with Patient 15  Only empties bag three times/day now. Will back down on Lomotil to #2 tid. Instructed him to increase to qid if stools increase. Adding ferrous sulfate to his medications and he will get this from [facility] today when he picks up his Lomotil script.

## 2015-07-24 NOTE — Patient Instructions (Signed)

## 2015-07-25 ENCOUNTER — Encounter (HOSPITAL_COMMUNITY): Payer: Self-pay | Admitting: Emergency Medicine

## 2015-07-25 ENCOUNTER — Emergency Department (HOSPITAL_COMMUNITY)
Admission: EM | Admit: 2015-07-25 | Discharge: 2015-07-25 | Disposition: A | Discharge status: Home / Self Care | Payer: Medicare Other | Attending: Emergency Medicine | Admitting: Emergency Medicine

## 2015-07-25 ENCOUNTER — Ambulatory Visit: Payer: Medicare Other

## 2015-07-25 ENCOUNTER — Other Ambulatory Visit: Payer: Self-pay | Admitting: Medical Oncology

## 2015-07-25 DIAGNOSIS — Z79899 Other long term (current) drug therapy: Secondary | ICD-10-CM | POA: Insufficient documentation

## 2015-07-25 DIAGNOSIS — T85898A Other specified complication of other internal prosthetic devices, implants and grafts, initial encounter: Secondary | ICD-10-CM | POA: Insufficient documentation

## 2015-07-25 DIAGNOSIS — Z859 Personal history of malignant neoplasm, unspecified: Secondary | ICD-10-CM | POA: Insufficient documentation

## 2015-07-25 DIAGNOSIS — Z8711 Personal history of peptic ulcer disease: Secondary | ICD-10-CM | POA: Insufficient documentation

## 2015-07-25 DIAGNOSIS — Y658 Other specified misadventures during surgical and medical care: Secondary | ICD-10-CM | POA: Insufficient documentation

## 2015-07-25 DIAGNOSIS — M1990 Unspecified osteoarthritis, unspecified site: Secondary | ICD-10-CM | POA: Insufficient documentation

## 2015-07-25 DIAGNOSIS — T451X5A Adverse effect of antineoplastic and immunosuppressive drugs, initial encounter: Secondary | ICD-10-CM

## 2015-07-25 DIAGNOSIS — I10 Essential (primary) hypertension: Secondary | ICD-10-CM | POA: Insufficient documentation

## 2015-07-25 DIAGNOSIS — Z8659 Personal history of other mental and behavioral disorders: Secondary | ICD-10-CM | POA: Insufficient documentation

## 2015-07-25 DIAGNOSIS — Z8719 Personal history of other diseases of the digestive system: Secondary | ICD-10-CM | POA: Diagnosis not present

## 2015-07-25 NOTE — Progress Notes (Signed)
Patient came to [facility] today due to his 5FU pump beeping. Pump said,"upstream occlusion". Pump was stopped. Port was assessed and flushed easily. Good blood return noted. Pump couldn't be "unlocked" by nurses. Pump switched out for a new one. Pump reprogrammed and we had patient wait 10 minutes to make sure it infused. Patient verbalized understanding.

## 2015-07-25 NOTE — Discharge Instructions (Signed)
You are having trouble with your pump, please be seen at the cancer center today, they have already made you an appointment.

## 2015-07-25 NOTE — ED Provider Notes (Signed)
CSN: [id number]     Arrival date & time 07/25/15  4098 History   First MD Initiated Contact with Patient 07/25/15 [phone]     Chief Complaint  Patient presents with  . pump issues      (Consider location/radiation/quality/duration/timing/severity/associated sxs/prior Treatment) Patient is a 81 y.o. male presenting with general illness. The history is provided by the patient.  Illness Severity:  Mild Onset quality:  Sudden Duration:  1 day Timing:  Constant Progression:  Unchanged Chronicity:  New Associated symptoms: no abdominal pain, no chest pain, no congestion, no diarrhea, no fever, no headaches, no myalgias, no rash, no shortness of breath and no vomiting    81 yo M with a chief complaint of his chemotherapy pump not working. Patient has been getting an error upstream occlusion on his home chemotherapy pump. The started after he sat on it by accident yesterday. He called the cancer center and they suggested he come to the emergency department. Patient was recently seen in the ED for increased output from his ostomy has been on Lomotil to decrease transit. Patient having much improved symptoms per him. Not having any other complaints currently.  Past Medical History  Diagnosis Date  . Hypertension   . PUD (peptic ulcer disease)     2006  . Depression   . Headache(784.0)   . Arthritis     general  . Alcohol abuse   . SBO (small bowel obstruction) ([place]) 04/2015  . Transfusion of blood product refused for religious reason   . Cancer [facility])    Past Surgical History  Procedure Laterality Date  . Cataract extraction Right   . Esophagogastroduodenoscopy N/A 11/06/2012    Procedure: ESOPHAGOGASTRODUODENOSCOPY (EGD);  Surgeon: Lear Ng, MD;  Location: [facility] ENDOSCOPY;  Service: Endoscopy;  Laterality: N/A;  . Esophagogastroduodenoscopy N/A 11/07/2012    Procedure: ESOPHAGOGASTRODUODENOSCOPY (EGD);  Surgeon: Lear Ng, MD;  Location: [facility] ENDOSCOPY;  Service: Endoscopy;   Laterality: N/A;  . Laparotomy N/A 04/20/2015    Procedure: EXPLORATORY LAPAROTOMY WITH PARTIAL COLECTOMY;  Surgeon: Rolm Bookbinder, MD;  Location: [place];  Service: General;  Laterality: N/A;  Exploratory lap with right colectomy, small bowel and sigmoid resection, ileostomy  . Colostomy    . Portacath placement Right 06/20/2015    Procedure: INSERTION PORT-A-CATH WITH ULTRASOUND;  Surgeon: Rolm Bookbinder, MD;  Location: [place];  Service: General;  Laterality: Right;   Family History  Problem Relation Age of Onset  . Ulcers Brother    Social History  Substance Use Topics  . Smoking status: Never Smoker   . Smokeless tobacco: Never Used  . Alcohol Use: 16.8 oz/week    28 Cans of beer per week    Review of Systems  Constitutional: Negative for fever and chills.  HENT: Negative for congestion and facial swelling.   Eyes: Negative for discharge and visual disturbance.  Respiratory: Negative for shortness of breath.   Cardiovascular: Negative for chest pain and palpitations.  Gastrointestinal: Negative for vomiting, abdominal pain and diarrhea.  Musculoskeletal: Negative for myalgias and arthralgias.  Skin: Negative for color change and rash.  Neurological: Negative for tremors, syncope and headaches.  Psychiatric/Behavioral: Negative for confusion and dysphoric mood.      Allergies  Review of patient's allergies indicates no known allergies.  Home Medications   Prior to Admission medications   Medication Sig Start Date End Date Taking? Authorizing Provider  calcium carbonate (TUMS - DOSED IN MG ELEMENTAL CALCIUM) 500 MG chewable tablet Chew  1 tablet by mouth 2 (two) times daily as needed for indigestion or heartburn.    Historical Provider, MD  diphenoxylate-atropine (LOMOTIL) 2.5-0.025 MG tablet Take 2 tablets by mouth 4 (four) times daily as needed for diarrhea or loose stools. 07/24/15   Owens Shark, NP  ferrous sulfate 325 (65 FE) MG tablet Take 325  mg by mouth 2 (two) times daily with a meal. 07/24/15   Owens Shark, NP  HYDROcodone-acetaminophen (NORCO) 5-325 MG tablet Take 1 tablet by mouth every 6 (six) hours as needed for moderate pain. 06/20/15   Rolm Bookbinder, MD  lidocaine-prilocaine (EMLA) cream Apply 1 application topically as needed. Apply 1 tablespoon to [facility] site and cover with plastic wrap 1 hour prior to chemo Patient not taking: Reported on 07/13/2015 06/15/15   Ladell Pier, MD  loperamide (IMODIUM) 2 MG capsule Take 2 mg by mouth 3 (three) times daily.    Historical Provider, MD  prochlorperazine (COMPAZINE) 10 MG tablet Take 1 tablet (10 mg total) by mouth every 6 (six) hours as needed for nausea. 06/15/15   Ladell Pier, MD   BP 146/65 mmHg  Pulse 61  Temp(Src) 97.5 F (36.4 C) (Oral)  Resp 18  SpO2 100% Physical Exam  Constitutional: He is oriented to person, place, and time. He appears well-developed and well-nourished.  HENT:  Head: Normocephalic and atraumatic.  Eyes: EOM are normal. Pupils are equal, round, and reactive to light.  Neck: Normal range of motion. Neck supple. No JVD present.  Cardiovascular: Normal rate and regular rhythm.  Exam reveals no gallop and no friction rub.   No murmur heard. Pulmonary/Chest: No respiratory distress. He has no wheezes.  Port accessed, no noted erythema or drainage to the surrounding area. All tubing appears to be intact heading towards his pump. No noted leaking.  Abdominal: He exhibits no distension. There is no tenderness. There is no rebound and no guarding.  Musculoskeletal: Normal range of motion.  Neurological: He is alert and oriented to person, place, and time.  Skin: No rash noted. No pallor.  Psychiatric: He has a normal mood and affect. His behavior is normal.  Nursing note and vitals reviewed.   ED Course  Procedures (including critical care time) Labs Review Labs Reviewed - No data to display  Imaging Review No results found. I have  personally reviewed and evaluated these images and lab results as part of my medical decision-making.   EKG Interpretation None      MDM   Final diagnoses:  Complication of chemotherapy, initial encounter    81 yo M with a chief complaints of chemotherapy pump complication. Patient otherwise well-appearing nontoxic. Vital signs stable. Discussed with cancer center will have him go to their clinic.    8:56 AM:  I have discussed the diagnosis/risks/treatment options with the patient and believe the pt to be eligible for discharge home to follow-up with PCP. We also discussed returning to the ED immediately if new or worsening sx occur.  Medications administered to the patient during their visit and any new prescriptions provided to the patient are listed below.  Medications given during this visit Medications - No data to display  New Prescriptions   No medications on file    The patient appears reasonably screen and/or stabilized for discharge and I doubt any other medical condition or other [facility] requiring further screening, evaluation, or treatment in the ED at this time prior to discharge.    Deno Etienne, DO 07/25/15  0857 

## 2015-07-25 NOTE — ED Notes (Signed)
Called cancer center, spoke to infusion RN, said for patient to check in at registration and they will assist him, MD aware

## 2015-07-25 NOTE — ED Notes (Signed)
Per pt, states he sat on chemo pump/machine yesterday-states it is not working-called cancer center and they told him to come to ED

## 2015-07-26 ENCOUNTER — Ambulatory Visit (HOSPITAL_BASED_OUTPATIENT_CLINIC_OR_DEPARTMENT_OTHER): Payer: Medicare Other

## 2015-07-26 VITALS — BP 137/55 | HR 71 | Temp 97.7°F | Resp 16

## 2015-07-26 DIAGNOSIS — C180 Malignant neoplasm of cecum: Secondary | ICD-10-CM

## 2015-07-26 DIAGNOSIS — C189 Malignant neoplasm of colon, unspecified: Secondary | ICD-10-CM

## 2015-07-26 MED ORDER — HEPARIN SOD (PORK) LOCK FLUSH 100 UNIT/ML IV SOLN
500.0000 [IU] | Freq: Once | INTRAVENOUS | Status: AC | PRN
  Administered 2015-07-26: 500 [IU]
  Filled 2015-07-26: qty 5

## 2015-07-26 MED ORDER — SODIUM CHLORIDE 0.9 % IJ SOLN
10.0000 mL | INTRAMUSCULAR | Status: DC | PRN
  Administered 2015-07-26: 10 mL
  Filled 2015-07-26: qty 10

## 2015-08-05 ENCOUNTER — Other Ambulatory Visit: Payer: Self-pay | Admitting: Oncology

## 2015-08-06 ENCOUNTER — Ambulatory Visit (HOSPITAL_BASED_OUTPATIENT_CLINIC_OR_DEPARTMENT_OTHER): Payer: Medicare Other | Admitting: Nurse Practitioner

## 2015-08-06 ENCOUNTER — Ambulatory Visit: Payer: Medicare Other | Admitting: Nurse Practitioner

## 2015-08-06 ENCOUNTER — Telehealth: Payer: Self-pay | Admitting: *Deleted

## 2015-08-06 ENCOUNTER — Telehealth: Payer: Self-pay | Admitting: Nurse Practitioner

## 2015-08-06 ENCOUNTER — Encounter: Payer: Self-pay | Admitting: *Deleted

## 2015-08-06 ENCOUNTER — Ambulatory Visit (HOSPITAL_BASED_OUTPATIENT_CLINIC_OR_DEPARTMENT_OTHER): Payer: Medicare Other

## 2015-08-06 ENCOUNTER — Other Ambulatory Visit (HOSPITAL_BASED_OUTPATIENT_CLINIC_OR_DEPARTMENT_OTHER): Payer: Medicare Other

## 2015-08-06 ENCOUNTER — Other Ambulatory Visit (HOSPITAL_COMMUNITY)
Admission: RE | Admit: 2015-08-06 | Discharge: 2015-08-06 | Disposition: A | Discharge status: Home / Self Care | Payer: Medicare Other | Source: Other Acute Inpatient Hospital | Attending: Oncology | Admitting: Oncology

## 2015-08-06 ENCOUNTER — Ambulatory Visit: Payer: Medicare Other

## 2015-08-06 VITALS — BP 138/60 | HR 60 | Temp 97.6°F | Resp 18 | Ht 74.0 in | Wt 193.9 lb

## 2015-08-06 DIAGNOSIS — C189 Malignant neoplasm of colon, unspecified: Secondary | ICD-10-CM | POA: Diagnosis not present

## 2015-08-06 DIAGNOSIS — R3911 Hesitancy of micturition: Secondary | ICD-10-CM | POA: Insufficient documentation

## 2015-08-06 DIAGNOSIS — C180 Malignant neoplasm of cecum: Secondary | ICD-10-CM | POA: Diagnosis not present

## 2015-08-06 DIAGNOSIS — Z5111 Encounter for antineoplastic chemotherapy: Secondary | ICD-10-CM | POA: Diagnosis not present

## 2015-08-06 DIAGNOSIS — T451X5A Adverse effect of antineoplastic and immunosuppressive drugs, initial encounter: Secondary | ICD-10-CM

## 2015-08-06 DIAGNOSIS — Z95828 Presence of other vascular implants and grafts: Secondary | ICD-10-CM

## 2015-08-06 DIAGNOSIS — G620 Drug-induced polyneuropathy: Secondary | ICD-10-CM

## 2015-08-06 LAB — COMPREHENSIVE METABOLIC PANEL (CC13)
ALBUMIN: 3.3 g/dL — AB (ref 3.5–5.0)
ALK PHOS: 50 U/L (ref 40–150)
ALT: <9 U/L (ref 0–55)
ANION GAP: 5 meq/L (ref 3–11)
AST: 16 U/L (ref 5–34)
BUN: 12.3 mg/dL (ref 7.0–26.0)
CALCIUM: 9.2 mg/dL (ref 8.4–10.4)
CO2: 23 mEq/L (ref 22–29)
Chloride: 110 mEq/L — ABNORMAL HIGH (ref 98–109)
Creatinine: 0.9 mg/dL (ref 0.7–1.3)
Glucose: 117 mg/dl (ref 70–140)
POTASSIUM: 3.6 meq/L (ref 3.5–5.1)
Sodium: 138 mEq/L (ref 136–145)
Total Bilirubin: 0.41 mg/dL (ref 0.20–1.20)
Total Protein: 5.7 g/dL — ABNORMAL LOW (ref 6.4–8.3)

## 2015-08-06 LAB — CBC WITH DIFFERENTIAL/PLATELET
BASO%: 0.5 % (ref 0.0–2.0)
BASOS ABS: 0 10*3/uL (ref 0.0–0.1)
EOS ABS: 0.1 10*3/uL (ref 0.0–0.5)
EOS%: 2.6 % (ref 0.0–7.0)
HEMATOCRIT: 30.9 % — AB (ref 38.4–49.9)
HEMOGLOBIN: 9.7 g/dL — AB (ref 13.0–17.1)
LYMPH#: 1.2 10*3/uL (ref 0.9–3.3)
LYMPH%: 28.5 % (ref 14.0–49.0)
MCH: 23.7 pg — AB (ref 27.2–33.4)
MCHC: 31.5 g/dL — ABNORMAL LOW (ref 32.0–36.0)
MCV: 75.2 fL — AB (ref 79.3–98.0)
MONO#: 0.5 10*3/uL (ref 0.1–0.9)
MONO%: 11.6 % (ref 0.0–14.0)
NEUT#: 2.4 10*3/uL (ref 1.5–6.5)
NEUT%: 56.8 % (ref 39.0–75.0)
PLATELETS: 101 10*3/uL — AB (ref 140–400)
RBC: 4.11 10*6/uL — ABNORMAL LOW (ref 4.20–5.82)
RDW: 17.3 % — AB (ref 11.0–14.6)
WBC: 4.3 10*3/uL (ref 4.0–10.3)

## 2015-08-06 LAB — URINALYSIS, ROUTINE W REFLEX MICROSCOPIC
BILIRUBIN URINE: NEGATIVE
GLUCOSE, UA: 250 mg/dL — AB
HGB URINE DIPSTICK: NEGATIVE
Ketones, ur: NEGATIVE mg/dL
Leukocytes, UA: NEGATIVE
Nitrite: NEGATIVE
Protein, ur: NEGATIVE mg/dL
SPECIFIC GRAVITY, URINE: 1.023 (ref 1.005–1.030)
pH: 5.5 (ref 5.0–8.0)

## 2015-08-06 MED ORDER — SODIUM CHLORIDE 0.9 % IJ SOLN
10.0000 mL | INTRAMUSCULAR | Status: DC | PRN
  Administered 2015-08-06: 10 mL via INTRAVENOUS
  Filled 2015-08-06: qty 10

## 2015-08-06 MED ORDER — FLUOROURACIL CHEMO INJECTION 2.5 GM/50ML
300.0000 mg/m2 | Freq: Once | INTRAVENOUS | Status: AC
  Administered 2015-08-06: 650 mg via INTRAVENOUS
  Filled 2015-08-06: qty 13

## 2015-08-06 MED ORDER — SODIUM CHLORIDE 0.9 % IV SOLN
1800.0000 mg/m2 | INTRAVENOUS | Status: DC
  Administered 2015-08-06: 3900 mg via INTRAVENOUS
  Filled 2015-08-06: qty 78

## 2015-08-06 MED ORDER — SODIUM CHLORIDE 0.9 % IJ SOLN
10.0000 mL | INTRAMUSCULAR | Status: DC | PRN
  Filled 2015-08-06: qty 10

## 2015-08-06 MED ORDER — SODIUM CHLORIDE 0.9 % IV SOLN
Freq: Once | INTRAVENOUS | Status: AC
Start: 2015-08-06 — End: 2015-08-06
  Administered 2015-08-06: 14:00:00 via INTRAVENOUS
  Filled 2015-08-06: qty 4

## 2015-08-06 MED ORDER — LEUCOVORIN CALCIUM INJECTION 350 MG
300.0000 mg/m2 | Freq: Once | INTRAVENOUS | Status: AC
  Administered 2015-08-06: 648 mg via INTRAVENOUS
  Filled 2015-08-06: qty 32.4

## 2015-08-06 MED ORDER — HEPARIN SOD (PORK) LOCK FLUSH 100 UNIT/ML IV SOLN
500.0000 [IU] | Freq: Once | INTRAVENOUS | Status: DC | PRN
  Filled 2015-08-06: qty 5

## 2015-08-06 MED ORDER — OXALIPLATIN CHEMO INJECTION 100 MG/20ML
85.0000 mg/m2 | Freq: Once | INTRAVENOUS | Status: AC
  Administered 2015-08-06: 185 mg via INTRAVENOUS
  Filled 2015-08-06: qty 37

## 2015-08-06 MED ORDER — DEXTROSE 5 % IV SOLN
Freq: Once | INTRAVENOUS | Status: AC
  Administered 2015-08-06: 14:00:00 via INTRAVENOUS

## 2015-08-06 NOTE — Progress Notes (Signed)
Oncology Nurse Navigator Documentation  Oncology Nurse Navigator Flowsheets 08/06/2015  Referral date to RadOnc/MedOnc -  Navigator Encounter Type 3 month;Treatment  Patient Visit Type Medonc  Treatment Phase Treatment # 4/5 on schedule  Barriers/Navigation Needs No barriers at this time  Education -  Interventions -  Referrals -  Education Method -  Support Groups/Services -  Specialty Items/DME -  Time Spent with Patient 15  Reports ostomy working well and diarrhea has improved. Drove self to appointment today.

## 2015-08-06 NOTE — Telephone Encounter (Signed)
Per staff message and POF I have scheduled appts. Advised scheduler of appts. JMW  

## 2015-08-06 NOTE — Telephone Encounter (Signed)
per pof to sch pt appt-gave pt copy of avs-sent MW email to sch pt trmt-pt aware °

## 2015-08-06 NOTE — Patient Instructions (Signed)
[facility] Discharge Instructions for Patients Receiving Chemotherapy  Today you received the following chemotherapy agents FOLFOX  To help prevent nausea and vomiting after your treatment, we encourage you to take your nausea medication as needed   If you develop nausea and vomiting that is not controlled by your nausea medication, call the clinic.   BELOW ARE SYMPTOMS THAT SHOULD BE REPORTED IMMEDIATELY:  *FEVER GREATER THAN 100.5 F  *CHILLS WITH OR WITHOUT FEVER  NAUSEA AND VOMITING THAT IS NOT CONTROLLED WITH YOUR NAUSEA MEDICATION  *UNUSUAL SHORTNESS OF BREATH  *UNUSUAL BRUISING OR BLEEDING  TENDERNESS IN MOUTH AND THROAT WITH OR WITHOUT PRESENCE OF ULCERS  *URINARY PROBLEMS  *BOWEL PROBLEMS  UNUSUAL RASH Items with * indicate a potential emergency and should be followed up as soon as possible.  Feel free to call the clinic you have any questions or concerns. The clinic phone number is [phone].  Please show the CHEMO ALERT CARD at check-in to the Emergency Department and triage nurse.   

## 2015-08-06 NOTE — Patient Instructions (Signed)

## 2015-08-07 ENCOUNTER — Other Ambulatory Visit: Payer: Medicare Other

## 2015-08-07 ENCOUNTER — Ambulatory Visit: Payer: Medicare Other | Admitting: Oncology

## 2015-08-08 ENCOUNTER — Encounter: Payer: Self-pay | Admitting: Nurse Practitioner

## 2015-08-08 ENCOUNTER — Telehealth: Payer: Self-pay | Admitting: Nurse Practitioner

## 2015-08-08 ENCOUNTER — Ambulatory Visit (HOSPITAL_BASED_OUTPATIENT_CLINIC_OR_DEPARTMENT_OTHER): Payer: Medicare Other

## 2015-08-08 VITALS — BP 118/45 | HR 89 | Temp 97.5°F | Resp 16

## 2015-08-08 DIAGNOSIS — C180 Malignant neoplasm of cecum: Secondary | ICD-10-CM | POA: Diagnosis not present

## 2015-08-08 DIAGNOSIS — C189 Malignant neoplasm of colon, unspecified: Secondary | ICD-10-CM

## 2015-08-08 LAB — URINE CULTURE

## 2015-08-08 MED ORDER — SODIUM CHLORIDE 0.9 % IJ SOLN
10.0000 mL | INTRAMUSCULAR | Status: DC | PRN
  Administered 2015-08-08: 10 mL
  Filled 2015-08-08: qty 10

## 2015-08-08 MED ORDER — HEPARIN SOD (PORK) LOCK FLUSH 100 UNIT/ML IV SOLN
500.0000 [IU] | Freq: Once | INTRAVENOUS | Status: AC | PRN
  Administered 2015-08-08: 500 [IU]
  Filled 2015-08-08: qty 5

## 2015-08-08 NOTE — Assessment & Plan Note (Signed)
Patient presents to the [facility] today to receive cycle 4 of his FOLFOX chemotherapy.  Patient reports some new onset urinary hesitancy for the past few days; but denies any dysuria, hematuria, or fever/chills.  He is also noticed some initial stages of numbness/tingling to his fingertips as well.  He states his appetite has been good; and he denies any issues with fatigue.  Labs obtained today revealed a WBC of 4.3, ANC 2.4, hematoma 9.7, platelet count 101.  Urinalysis obtained today was within normal limits.  Urine culture results pending.  Due to initial stages of chemotherapy-induced neuropathy-will decrease the oxaliplatin portion of his chemotherapy somewhat.  The plan is for the patient to proceed with his chemotherapy today as planned.  Patient will return on 08/20/2015 for labs, visit, and cycle 5 of his chemotherapy.  Will schedule a restaging CT with contrast of the chest/abdomen/pelvis for the week of 08/27/2015.

## 2015-08-08 NOTE — Telephone Encounter (Signed)
perp of pt to be sch for CT abd/pel-cld pt & left message to get contrast on 12/5 appt

## 2015-08-08 NOTE — Assessment & Plan Note (Signed)
Patient reports new onset urinary hesitancy; but denies any dysuria, hematuria, recent fever/chills.  He also denies any flank pain.  Urinalysis obtained today was normal.  Awaiting urine culture results.  Advised patient to call/return go directly to the emergency department for any worsening symptoms whatsoever.

## 2015-08-08 NOTE — Progress Notes (Signed)
SYMPTOM MANAGEMENT CLINIC   HPI: Harold Price 81 y.o. male diagnosed with colon cancer.  Currently undergoing FOLFOX chemotherapy regimen. Patient presents to the cancer Center today to receive cycle 4 of his FOLFOX chemotherapy.  Patient reports some new onset urinary hesitancy for the past few days; but denies any dysuria, hematuria, or fever/chills.  He is also noticed some initial stages of numbness/tingling to his fingertips as well.  He states his appetite has been good; and he denies any issues with fatigue.  Labs obtained today revealed a WBC of 4.3, ANC 2.4, hematoma 9.7, platelet count 101.  Urinalysis obtained today was within normal limits.  Urine culture results pending.  Due to initial stages of chemotherapy-induced neuropathy-will decrease the oxaliplatin portion of his chemotherapy somewhat.  The plan is for the patient to proceed with his chemotherapy today as planned.  Patient will return on 08/20/2015 for labs, visit, and cycle 5 of his chemotherapy.  Will schedule a restaging CT with contrast of the chest/abdomen/pelvis for the week of 08/27/2015.   HPI  ROS  Past Medical History  Diagnosis Date  . Hypertension   . PUD (peptic ulcer disease)     2006  . Depression   . Headache(784.0)   . Arthritis     general  . Alcohol abuse   . SBO (small bowel obstruction) ([facility]) 04/2015  . Transfusion of blood product refused for religious reason   . Cancer [facility])     Past Surgical History  Procedure Laterality Date  . Cataract extraction Right   . Esophagogastroduodenoscopy N/A 11/06/2012    Procedure: ESOPHAGOGASTRODUODENOSCOPY (EGD);  Surgeon: Shirley Friar, MD;  Location: [facility] ENDOSCOPY;  Service: Endoscopy;  Laterality: N/A;  . Esophagogastroduodenoscopy N/A 11/07/2012    Procedure: ESOPHAGOGASTRODUODENOSCOPY (EGD);  Surgeon: Shirley Friar, MD;  Location: [facility] ENDOSCOPY;  Service: Endoscopy;  Laterality: N/A;  . Laparotomy N/A 04/20/2015    Procedure:  EXPLORATORY LAPAROTOMY WITH PARTIAL COLECTOMY;  Surgeon: Emelia Loron, MD;  Location: [facility] OR;  Service: General;  Laterality: N/A;  Exploratory lap with right colectomy, small bowel and sigmoid resection, ileostomy  . Colostomy    . Portacath placement Right 06/20/2015    Procedure: INSERTION PORT-A-CATH WITH ULTRASOUND;  Surgeon: Emelia Loron, MD;  Location: [facility];  Service: General;  Laterality: Right;    has Acute GI bleeding; Acute blood loss anemia; Generalized weakness; Alcohol abuse; History of ulcer disease; Refusal of blood transfusions as patient is Jehov[employer]'s Witness; Acute upper GI bleed; Duodenal ulcer with hemorrhage; Small bowel obstruction (HCC); Hypertension; PUD (peptic ulcer disease); Depression; Small bowel mass; Essential hypertension; Leukocytosis; Hypokalemia; LVH (left ventricular hypertrophy); Hypophosphatemia; Atrial fibrillation with RVR (HCC); Septic shock (HCC); Colon cancer (HCC); Urinary hesitancy; and Neuropathy due to chemotherapeutic drug (HCC) on his problem list.    has No Known Allergies.    Medication List       This list is accurate as of: 08/06/15 11:59 PM.  Always use your most recent med list.               calcium carbonate 500 MG chewable tablet  Commonly known as:  TUMS - dosed in mg elemental calcium  Chew 1 tablet by mouth 2 (two) times daily as needed for indigestion or heartburn.     diphenoxylate-atropine 2.5-0.025 MG tablet  Commonly known as:  LOMOTIL  Take 2 tablets by mouth 4 (four) times daily as needed for diarrhea or loose stools.     ferrous sulfate  325 (65 FE) MG tablet  Take 325 mg by mouth 2 (two) times daily with a meal.     HYDROcodone-acetaminophen 5-325 MG tablet  Commonly known as:  NORCO  Take 1 tablet by mouth every 6 (six) hours as needed for moderate pain.     lidocaine-prilocaine cream  Commonly known as:  EMLA  Apply 1 application topically as needed. Apply 1 tablespoon to [facility] site  and cover with plastic wrap 1 hour prior to chemo     loperamide 2 MG capsule  Commonly known as:  IMODIUM  Take 2 mg by mouth 3 (three) times daily.     prochlorperazine 10 MG tablet  Commonly known as:  COMPAZINE  Take 1 tablet (10 mg total) by mouth every 6 (six) hours as needed for nausea.         PHYSICAL EXAMINATION  Oncology Vitals 08/08/2015 08/06/2015  Height - 188 cm  Weight - 87.952 kg  Weight (lbs) - 193 lbs 14 oz  BMI (kg/m2) - 24.9 kg/m2  Temp 97.5 97.6  Pulse 89 60  Resp 16 18  SpO2 - 100  BSA (m2) - 2.14 m2   BP Readings from Last 2 Encounters:  08/08/15 118/45  08/06/15 138/60    Physical Exam  Constitutional: He is oriented to person, place, and time and well-developed, well-nourished, and in no distress.  HENT:  Head: Normocephalic and atraumatic.  Mouth/Throat: Oropharynx is clear and moist.  Eyes: Conjunctivae and EOM are normal. Pupils are equal, round, and reactive to light. Right eye exhibits no discharge. Left eye exhibits no discharge. No scleral icterus.  Neck: Normal range of motion. Neck supple. No JVD present. No tracheal deviation present. No thyromegaly present.  Cardiovascular: Normal rate, regular rhythm, normal heart sounds and intact distal pulses.   Pulmonary/Chest: Effort normal and breath sounds normal. No respiratory distress. He has no wheezes. He has no rales. He exhibits no tenderness.  Abdominal: Soft. Bowel sounds are normal. He exhibits no distension. There is no tenderness. There is no rebound and no guarding.  No flank pain.  Ostomy intact with no abnormalities.  Musculoskeletal: Normal range of motion. He exhibits no edema or tenderness.  Lymphadenopathy:    He has no cervical adenopathy.  Neurological: He is alert and oriented to person, place, and time. Gait normal.  Skin: Skin is warm and dry. No rash noted. No erythema. No pallor.  Psychiatric: Affect normal.  Nursing note and vitals reviewed.   LABORATORY  DATA:. Hospital Outpatient Visit on 08/06/2015  Component Date Value Ref Range Status  . Color, Urine 08/06/2015 YELLOW  YELLOW Final  . APPearance 08/06/2015 CLOUDY* CLEAR Final  . Specific Gravity, Urine 08/06/2015 1.023  1.005 - 1.030 Final  . pH 08/06/2015 5.5  5.0 - 8.0 Final  . Glucose, UA 08/06/2015 250* NEGATIVE mg/dL Final  . Hgb urine dipstick 08/06/2015 NEGATIVE  NEGATIVE Final  . Bilirubin Urine 08/06/2015 NEGATIVE  NEGATIVE Final  . Ketones, ur 08/06/2015 NEGATIVE  NEGATIVE mg/dL Final  . Protein, ur 08/06/2015 NEGATIVE  NEGATIVE mg/dL Final  . Nitrite 08/06/2015 NEGATIVE  NEGATIVE Final  . Leukocytes, UA 08/06/2015 NEGATIVE  NEGATIVE Final   MICROSCOPIC NOT DONE ON URINES WITH NEGATIVE PROTEIN, BLOOD, LEUKOCYTES, NITRITE, OR GLUCOSE <1000 mg/dL.  Marland Kitchen Specimen Description 08/06/2015 URINE, RANDOM   Final  . Special Requests 08/06/2015 NONE   Final  . Culture 08/06/2015    Final  Value:MULTIPLE SPECIES PRESENT, SUGGEST RECOLLECTION Performed at [facility]   . Report Status 08/06/2015 08/08/2015 FINAL   Final  Appointment on 08/06/2015  Component Date Value Ref Range Status  . WBC 08/06/2015 4.3  4.0 - 10.3 10e3/uL Final  . NEUT# 08/06/2015 2.4  1.5 - 6.5 10e3/uL Final  . HGB 08/06/2015 9.7* 13.0 - 17.1 g/dL Final  . HCT 08/06/2015 30.9* 38.4 - 49.9 % Final  . Platelets 08/06/2015 101* 140 - 400 10e3/uL Final  . MCV 08/06/2015 75.2* 79.3 - 98.0 fL Final  . MCH 08/06/2015 23.7* 27.2 - 33.4 pg Final  . MCHC 08/06/2015 31.5* 32.0 - 36.0 g/dL Final  . RBC 08/06/2015 4.11* 4.20 - 5.82 10e6/uL Final  . RDW 08/06/2015 17.3* 11.0 - 14.6 % Final  . lymph# 08/06/2015 1.2  0.9 - 3.3 10e3/uL Final  . MONO# 08/06/2015 0.5  0.1 - 0.9 10e3/uL Final  . Eosinophils Absolute 08/06/2015 0.1  0.0 - 0.5 10e3/uL Final  . Basophils Absolute 08/06/2015 0.0  0.0 - 0.1 10e3/uL Final  . NEUT% 08/06/2015 56.8  39.0 - 75.0 % Final  . LYMPH% 08/06/2015 28.5  14.0 - 49.0 %  Final  . MONO% 08/06/2015 11.6  0.0 - 14.0 % Final  . EOS% 08/06/2015 2.6  0.0 - 7.0 % Final  . BASO% 08/06/2015 0.5  0.0 - 2.0 % Final  . Sodium 08/06/2015 138  136 - 145 mEq/L Final  . Potassium 08/06/2015 3.6  3.5 - 5.1 mEq/L Final  . Chloride 08/06/2015 110* 98 - 109 mEq/L Final  . CO2 08/06/2015 23  22 - 29 mEq/L Final  . Glucose 08/06/2015 117  70 - 140 mg/dl Final   Glucose reference range is for nonfasting patients. Fasting glucose reference range is 70- 100.  Marland Kitchen BUN 08/06/2015 12.3  7.0 - 26.0 mg/dL Final  . Creatinine 08/06/2015 0.9  0.7 - 1.3 mg/dL Final  . Total Bilirubin 08/06/2015 0.41  0.20 - 1.20 mg/dL Final  . Alkaline Phosphatase 08/06/2015 50  40 - 150 U/L Final  . AST 08/06/2015 16  5 - 34 U/L Final  . ALT 08/06/2015 <9  0 - 55 U/L Final  . Total Protein 08/06/2015 5.7* 6.4 - 8.3 g/dL Final  . Albumin 08/06/2015 3.3* 3.5 - 5.0 g/dL Final  . Calcium 08/06/2015 9.2  8.4 - 10.4 mg/dL Final  . Anion Gap 08/06/2015 5  3 - 11 mEq/L Final  . EGFR 08/06/2015 >90  >90 ml/min/1.73 m2 Final   eGFR is calculated using the CKD-EPI Creatinine Equation (2009)     RADIOGRAPHIC STUDIES: No results found.  ASSESSMENT/PLAN:    Colon cancer [facility]) Patient presents to the [facility] today to receive cycle 4 of his FOLFOX chemotherapy.  Patient reports some new onset urinary hesitancy for the past few days; but denies any dysuria, hematuria, or fever/chills.  He is also noticed some initial stages of numbness/tingling to his fingertips as well.  He states his appetite has been good; and he denies any issues with fatigue.  Labs obtained today revealed a WBC of 4.3, ANC 2.4, hematoma 9.7, platelet count 101.  Urinalysis obtained today was within normal limits.  Urine culture results pending.  Due to initial stages of chemotherapy-induced neuropathy-will decrease the oxaliplatin portion of his chemotherapy somewhat.  The plan is for the patient to proceed with his chemotherapy today  as planned.  Patient will return on 08/20/2015 for labs, visit, and cycle 5 of his chemotherapy.  Will schedule  a restaging CT with contrast of the chest/abdomen/pelvis for the week of 08/27/2015.  Urinary hesitancy Patient reports new onset urinary hesitancy; but denies any dysuria, hematuria, recent fever/chills.  He also denies any flank pain.  Urinalysis obtained today was normal.  Awaiting urine culture results.  Advised patient to call/return go directly to the emergency department for any worsening symptoms whatsoever.  Neuropathy due to chemotherapeutic drug [facility]) Patient reports recent onset of mild neuropathy to his fingertips only.  Will decrease the oxaliplatin portion of his chemotherapy today to see if this helps.  We'll continue to monitor closely.  Patient stated understanding of all instructions; and was in agreement with this plan of care. The patient knows to call the clinic with any problems, questions or concerns.   Review/collaboration with Dr. Benay Spice regarding all aspects of patient's visit today.   Total time spent with patient was 25 minutes;  with greater than 75 percent of that time spent in face to face counseling regarding patient's symptoms,  and coordination of care and follow up.  Disclaimer:This dictation was prepared with Dragon/digital dictation along with [employer]. Any transcriptional errors that result from this process are unintentional.  Drue Second, NP 08/08/2015

## 2015-08-08 NOTE — Assessment & Plan Note (Signed)
Patient reports recent onset of mild neuropathy to his fingertips only.  Will decrease the oxaliplatin portion of his chemotherapy today to see if this helps.  We'll continue to monitor closely.

## 2015-08-10 ENCOUNTER — Emergency Department (HOSPITAL_COMMUNITY): Payer: Medicare Other

## 2015-08-10 ENCOUNTER — Other Ambulatory Visit: Payer: Self-pay | Admitting: Nurse Practitioner

## 2015-08-10 ENCOUNTER — Other Ambulatory Visit: Payer: Self-pay

## 2015-08-10 ENCOUNTER — Emergency Department (HOSPITAL_COMMUNITY)
Admission: EM | Admit: 2015-08-10 | Discharge: 2015-08-10 | Disposition: A | Discharge status: Home / Self Care | Payer: Medicare Other | Attending: Emergency Medicine | Admitting: Emergency Medicine

## 2015-08-10 ENCOUNTER — Encounter (HOSPITAL_COMMUNITY): Payer: Self-pay | Admitting: Emergency Medicine

## 2015-08-10 DIAGNOSIS — J069 Acute upper respiratory infection, unspecified: Secondary | ICD-10-CM | POA: Diagnosis not present

## 2015-08-10 DIAGNOSIS — Z8739 Personal history of other diseases of the musculoskeletal system and connective tissue: Secondary | ICD-10-CM | POA: Diagnosis not present

## 2015-08-10 DIAGNOSIS — Z85038 Personal history of other malignant neoplasm of large intestine: Secondary | ICD-10-CM | POA: Diagnosis not present

## 2015-08-10 DIAGNOSIS — Z8711 Personal history of peptic ulcer disease: Secondary | ICD-10-CM | POA: Insufficient documentation

## 2015-08-10 DIAGNOSIS — I10 Essential (primary) hypertension: Secondary | ICD-10-CM | POA: Insufficient documentation

## 2015-08-10 DIAGNOSIS — Z8659 Personal history of other mental and behavioral disorders: Secondary | ICD-10-CM | POA: Diagnosis not present

## 2015-08-10 DIAGNOSIS — Z79899 Other long term (current) drug therapy: Secondary | ICD-10-CM | POA: Diagnosis not present

## 2015-08-10 DIAGNOSIS — Z8719 Personal history of other diseases of the digestive system: Secondary | ICD-10-CM | POA: Diagnosis not present

## 2015-08-10 DIAGNOSIS — R0602 Shortness of breath: Secondary | ICD-10-CM | POA: Diagnosis present

## 2015-08-10 LAB — I-STAT TROPONIN, ED: Troponin i, poc: 0.01 ng/mL (ref 0.00–0.08)

## 2015-08-10 LAB — CBC WITH DIFFERENTIAL/PLATELET
Basophils Absolute: 0 10*3/uL (ref 0.0–0.1)
Basophils Relative: 1 %
Eosinophils Absolute: 0.1 10*3/uL (ref 0.0–0.7)
Eosinophils Relative: 2 %
HEMATOCRIT: 30.4 % — AB (ref 39.0–52.0)
HEMOGLOBIN: 9.8 g/dL — AB (ref 13.0–17.0)
LYMPHS ABS: 0.9 10*3/uL (ref 0.7–4.0)
Lymphocytes Relative: 32 %
MCH: 24.3 pg — AB (ref 26.0–34.0)
MCHC: 32.2 g/dL (ref 30.0–36.0)
MCV: 75.2 fL — ABNORMAL LOW (ref 78.0–100.0)
MONOS PCT: 9 %
Monocytes Absolute: 0.3 10*3/uL (ref 0.1–1.0)
NEUTROS ABS: 1.6 10*3/uL — AB (ref 1.7–7.7)
NEUTROS PCT: 57 %
Platelets: 69 10*3/uL — ABNORMAL LOW (ref 150–400)
RBC: 4.04 MIL/uL — AB (ref 4.22–5.81)
RDW: 16.8 % — ABNORMAL HIGH (ref 11.5–15.5)
WBC: 2.8 10*3/uL — AB (ref 4.0–10.5)

## 2015-08-10 LAB — I-STAT CHEM 8, ED
BUN: 9 mg/dL (ref 6–20)
CALCIUM ION: 1.25 mmol/L (ref 1.13–1.30)
CHLORIDE: 99 mmol/L — AB (ref 101–111)
CREATININE: 0.8 mg/dL (ref 0.61–1.24)
Glucose, Bld: 108 mg/dL — ABNORMAL HIGH (ref 65–99)
HEMATOCRIT: 33 % — AB (ref 39.0–52.0)
Hemoglobin: 11.2 g/dL — ABNORMAL LOW (ref 13.0–17.0)
Potassium: 3.2 mmol/L — ABNORMAL LOW (ref 3.5–5.1)
Sodium: 135 mmol/L (ref 135–145)
TCO2: 25 mmol/L (ref 0–100)

## 2015-08-10 LAB — BRAIN NATRIURETIC PEPTIDE: B NATRIURETIC PEPTIDE 5: 65.5 pg/mL (ref 0.0–100.0)

## 2015-08-10 MED ORDER — HEPARIN SOD (PORK) LOCK FLUSH 100 UNIT/ML IV SOLN
500.0000 [IU] | Freq: Once | INTRAVENOUS | Status: AC
  Administered 2015-08-10: 500 [IU]
  Filled 2015-08-10: qty 5

## 2015-08-10 NOTE — ED Notes (Signed)
Pt complaint of SOB onset yesterday; denies pain; clear/diminished lung sounds; symmetrical.

## 2015-08-10 NOTE — ED Provider Notes (Signed)
CSN: [id number]     Arrival date & time 08/10/15  1035 History   First MD Initiated Contact with Patient 08/10/15 1253     Chief Complaint  Patient presents with  . Shortness of Breath     (Consider location/radiation/quality/duration/timing/severity/associated sxs/prior Treatment) HPI Comments: Patient presents with shortness of breath. He has a history of colon cancer and is currently undergoing chemotherapy. He also has history of hypertension and prior alcohol abuse. He states he started feeling some shortness of breath yesterday morning and also noticed a little bit today. It was more in ambulation. He feels like there is phlegm in the back of his throat and he has some congestion. He's coughing up a little bit of phlegm but he feels like it's thick and stuck in his throat. He denies any wheezing. No fevers. No vomiting. No associated chest pain or tightness. No leg pain or swelling. No known history of blood clots. He has no past history of lung disease. He has not used any medications at home for the symptoms.  Patient is a 81 y.o. male presenting with shortness of breath.  Shortness of Breath Associated symptoms: no abdominal pain, no chest pain, no cough, no diaphoresis, no fever, no headaches, no rash and no vomiting     Past Medical History  Diagnosis Date  . Hypertension   . PUD (peptic ulcer disease)     2006  . Depression   . Headache(784.0)   . Arthritis     general  . Alcohol abuse   . SBO (small bowel obstruction) ([place]) 04/2015  . Transfusion of blood product refused for religious reason   . Cancer [facility])    Past Surgical History  Procedure Laterality Date  . Cataract extraction Right   . Esophagogastroduodenoscopy N/A 11/06/2012    Procedure: ESOPHAGOGASTRODUODENOSCOPY (EGD);  Surgeon: Lear Ng, MD;  Location: [facility] ENDOSCOPY;  Service: Endoscopy;  Laterality: N/A;  . Esophagogastroduodenoscopy N/A 11/07/2012    Procedure: ESOPHAGOGASTRODUODENOSCOPY (EGD);   Surgeon: Lear Ng, MD;  Location: [facility] ENDOSCOPY;  Service: Endoscopy;  Laterality: N/A;  . Laparotomy N/A 04/20/2015    Procedure: EXPLORATORY LAPAROTOMY WITH PARTIAL COLECTOMY;  Surgeon: Rolm Bookbinder, MD;  Location: [place];  Service: General;  Laterality: N/A;  Exploratory lap with right colectomy, small bowel and sigmoid resection, ileostomy  . Colostomy    . Portacath placement Right 06/20/2015    Procedure: INSERTION PORT-A-CATH WITH ULTRASOUND;  Surgeon: Rolm Bookbinder, MD;  Location: [place];  Service: General;  Laterality: Right;   Family History  Problem Relation Age of Onset  . Ulcers Brother    Social History  Substance Use Topics  . Smoking status: Never Smoker   . Smokeless tobacco: Never Used  . Alcohol Use: 16.8 oz/week    28 Cans of beer per week    Review of Systems  Constitutional: Negative for fever, chills, diaphoresis and fatigue.  HENT: Positive for congestion. Negative for rhinorrhea and sneezing.   Eyes: Negative.   Respiratory: Positive for shortness of breath. Negative for cough and chest tightness.   Cardiovascular: Negative for chest pain and leg swelling.  Gastrointestinal: Negative for nausea, vomiting, abdominal pain, diarrhea and blood in stool.  Genitourinary: Negative for frequency, hematuria, flank pain and difficulty urinating.  Musculoskeletal: Negative for back pain and arthralgias.  Skin: Negative for rash.  Neurological: Negative for dizziness, speech difficulty, weakness, numbness and headaches.      Allergies  Review of patient's allergies indicates no known  allergies.  Home Medications   Prior to Admission medications   Medication Sig Start Date End Date Taking? Authorizing Provider  calcium carbonate (TUMS - DOSED IN MG ELEMENTAL CALCIUM) 500 MG chewable tablet Chew 1 tablet by mouth 2 (two) times daily as needed for indigestion or heartburn.   Yes Historical Provider, MD  diphenoxylate-atropine  (LOMOTIL) 2.5-0.025 MG tablet Take 2 tablets by mouth 4 (four) times daily as needed for diarrhea or loose stools. 07/24/15  Yes Owens Shark, NP  ferrous sulfate 325 (65 FE) MG tablet Take 325 mg by mouth 2 (two) times daily with a meal. 07/24/15  Yes Owens Shark, NP  loperamide (IMODIUM) 2 MG capsule Take 2 mg by mouth 3 (three) times daily.   Yes Historical Provider, MD  prochlorperazine (COMPAZINE) 10 MG tablet Take 1 tablet (10 mg total) by mouth every 6 (six) hours as needed for nausea. 06/15/15  Yes Ladell Pier, MD  HYDROcodone-acetaminophen (NORCO) 5-325 MG tablet Take 1 tablet by mouth every 6 (six) hours as needed for moderate pain. Patient not taking: Reported on 08/06/2015 06/20/15   Rolm Bookbinder, MD  lidocaine-prilocaine (EMLA) cream Apply 1 application topically as needed. Apply 1 tablespoon to [facility] site and cover with plastic wrap 1 hour prior to chemo 06/15/15   Ladell Pier, MD   BP 161/67 mmHg  Pulse 56  Temp(Src) 97.9 F (36.6 C) (Oral)  Resp 13  Ht 6\' 2"  (1.88 m)  Wt 196 lb (88.905 kg)  BMI 25.15 kg/m2  SpO2 100% Physical Exam  Constitutional: He is oriented to person, place, and time. He appears well-developed and well-nourished.  HENT:  Head: Normocephalic and atraumatic.  Eyes: Pupils are equal, round, and reactive to light.  Neck: Normal range of motion. Neck supple.  Cardiovascular: Normal rate, regular rhythm and normal heart sounds.   Pulmonary/Chest: Effort normal and breath sounds normal. No respiratory distress. He has no wheezes. He has no rales. He exhibits no tenderness.  Abdominal: Soft. Bowel sounds are normal. There is no tenderness. There is no rebound and no guarding.  Musculoskeletal: Normal range of motion. He exhibits edema.  Trace edema bilaterally. No calf tenderness.  Lymphadenopathy:    He has no cervical adenopathy.  Neurological: He is alert and oriented to person, place, and time.  Skin: Skin is warm and dry. No rash noted.   Psychiatric: He has a normal mood and affect.    ED Course  Procedures (including critical care time) Labs Review Labs Reviewed  CBC WITH DIFFERENTIAL/PLATELET - Abnormal; Notable for the following:    WBC 2.8 (*)    RBC 4.04 (*)    Hemoglobin 9.8 (*)    HCT 30.4 (*)    MCV 75.2 (*)    MCH 24.3 (*)    RDW 16.8 (*)    Platelets 69 (*)    Neutro Abs 1.6 (*)    All other components within normal limits  I-STAT CHEM 8, ED - Abnormal; Notable for the following:    Potassium 3.2 (*)    Chloride 99 (*)    Glucose, Bld 108 (*)    Hemoglobin 11.2 (*)    HCT 33.0 (*)    All other components within normal limits  BRAIN NATRIURETIC PEPTIDE  I-STAT TROPOININ, ED    Imaging Review Dg Chest 2 View  08/10/2015  CLINICAL DATA:  Short of breath EXAM: CHEST  2 VIEW COMPARISON:  06/20/2015 FINDINGS: Heart size and vascularity normal. Negative for heart  failure. Lungs are clear without infiltrate or effusion. Port-A-Cath tip in the SVC is unchanged. IMPRESSION: No active cardiopulmonary disease. Electronically Signed   By: Franchot Gallo M.D.   On: 08/10/2015 11:33   I have personally reviewed and evaluated these images and lab results as part of my medical decision-making.   EKG Interpretation   Date/Time:  Friday August 10 2015 11:04:32 EST Ventricular Rate:  65 PR Interval:  66 QRS Duration: 100 QT Interval:  459 QTC Calculation: 477 R Axis:   -9 Text Interpretation:  Sinus or ectopic atrial rhythm Atrial premature  complex Short PR interval Borderline prolonged QT interval since last  tracing no significant change Confirmed by Megann Easterwood  MD, Manson Luckadoo ([id number]) on  08/10/2015 4:20:33 PM      MDM   Final diagnoses:  URI (upper respiratory infection)    Patient presents with shortness of breath and associated upper airway congestion. There is no evidence of pneumonia. His lungs are clear. He has normal oxygen saturations. He doesn't have any suggestions of a pulmonary embolus.  There is no evidence of congestive heart failure. He ambulated with normal oxygen saturations and no reported shortness of breath. I feel that his symptoms are related to his upper airway congestion. I advised him to use Mucinex. His platelet count is lower than it has been in the past. His white blood cell count and hemoglobin are similar to his prior values. I advised him to call his oncologist on Monday and let them know about this change in his blood work. He was given strict return precautions to return if he has any worsening symptoms over the weekend.    Malvin Johns, MD 08/10/15 [phone]

## 2015-08-10 NOTE — ED Notes (Signed)
Pt ambulated in hall, O2 dropped to 94% then went back up to 99%

## 2015-08-10 NOTE — Discharge Instructions (Signed)
Upper Respiratory Infection, Adult Most upper respiratory infections (URIs) are a viral infection of the air passages leading to the lungs. A URI affects the nose, throat, and upper air passages. The most common type of URI is nasopharyngitis and is typically referred to as "the common cold." URIs run their course and usually go away on their own. Most of the time, a URI does not require medical attention, but sometimes a bacterial infection in the upper airways can follow a viral infection. This is called a secondary infection. Sinus and middle ear infections are common types of secondary upper respiratory infections. Bacterial pneumonia can also complicate a URI. A URI can worsen asthma and chronic obstructive pulmonary disease (COPD). Sometimes, these complications can require emergency medical care and may be life threatening.  CAUSES Almost all URIs are caused by viruses. A virus is a type of germ and can spread from one person to another.  RISKS FACTORS You may be at risk for a URI if:   You smoke.   You have chronic heart or lung disease.  You have a weakened defense (immune) system.   You are very young or very old.   You have nasal allergies or asthma.  You work in crowded or poorly ventilated areas.  You work in health care facilities or schools. SIGNS AND SYMPTOMS  Symptoms typically develop 2-3 days after you come in contact with a cold virus. Most viral URIs last 7-10 days. However, viral URIs from the influenza virus (flu virus) can last 14-18 days and are typically more severe. Symptoms may include:   Runny or stuffy (congested) nose.   Sneezing.   Cough.   Sore throat.   Headache.   Fatigue.   Fever.   Loss of appetite.   Pain in your forehead, behind your eyes, and over your cheekbones (sinus pain).  Muscle aches.  DIAGNOSIS  Your health care provider may diagnose a URI by:  Physical exam.  Tests to check that your symptoms are not due to  another condition such as:  Strep throat.  Sinusitis.  Pneumonia.  Asthma. TREATMENT  A URI goes away on its own with time. It cannot be cured with medicines, but medicines may be prescribed or recommended to relieve symptoms. Medicines may help:  Reduce your fever.  Reduce your cough.  Relieve nasal congestion. HOME CARE INSTRUCTIONS   Take medicines only as directed by your health care provider.   Gargle warm saltwater or take cough drops to comfort your throat as directed by your health care provider.  Use a warm mist humidifier or inhale steam from a shower to increase air moisture. This may make it easier to breathe.  Drink enough fluid to keep your urine clear or pale yellow.   Eat soups and other clear broths and maintain good nutrition.   Rest as needed.   Return to work when your temperature has returned to normal or as your health care provider advises. You may need to stay home longer to avoid infecting others. You can also use a face mask and careful hand washing to prevent spread of the virus.  Increase the usage of your inhaler if you have asthma.   Do not use any tobacco products, including cigarettes, chewing tobacco, or electronic cigarettes. If you need help quitting, ask your health care provider. PREVENTION  The best way to protect yourself from getting a cold is to practice good hygiene.   Avoid oral or hand contact with people with cold   symptoms.   Wash your hands often if contact occurs.  There is no clear evidence that vitamin C, vitamin E, echinacea, or exercise reduces the chance of developing a cold. However, it is always recommended to get plenty of rest, exercise, and practice good nutrition.  SEEK MEDICAL CARE IF:   You are getting worse rather than better.   Your symptoms are not controlled by medicine.   You have chills.  You have worsening shortness of breath.  You have brown or red mucus.  You have yellow or brown nasal  discharge.  You have pain in your face, especially when you bend forward.  You have a fever.  You have swollen neck glands.  You have pain while swallowing.  You have white areas in the back of your throat. SEEK IMMEDIATE MEDICAL CARE IF:   You have severe or persistent:  Headache.  Ear pain.  Sinus pain.  Chest pain.  You have chronic lung disease and any of the following:  Wheezing.  Prolonged cough.  Coughing up blood.  A change in your usual mucus.  You have a stiff neck.  You have changes in your:  Vision.  Hearing.  Thinking.  Mood. MAKE SURE YOU:   Understand these instructions.  Will watch your condition.  Will get help right away if you are not doing well or get worse.   This information is not intended to replace advice given to you by your health care provider. Make sure you discuss any questions you have with your health care provider.   Document Released: 02/25/2001 Document Revised: 01/16/2015 Document Reviewed: 12/07/2013 Elsevier Interactive Patient Education 2016 Else[employer] Inc.  

## 2015-08-13 ENCOUNTER — Other Ambulatory Visit: Payer: Self-pay | Admitting: *Deleted

## 2015-08-13 ENCOUNTER — Telehealth: Payer: Self-pay | Admitting: Oncology

## 2015-08-13 DIAGNOSIS — C189 Malignant neoplasm of colon, unspecified: Secondary | ICD-10-CM

## 2015-08-13 NOTE — Telephone Encounter (Signed)
appointment made and patient called

## 2015-08-14 ENCOUNTER — Emergency Department (HOSPITAL_COMMUNITY): Payer: Medicare Other

## 2015-08-14 ENCOUNTER — Encounter (HOSPITAL_COMMUNITY): Payer: Self-pay | Admitting: Emergency Medicine

## 2015-08-14 ENCOUNTER — Inpatient Hospital Stay (HOSPITAL_COMMUNITY)
Admission: EM | Admit: 2015-08-14 | Discharge: 2015-08-16 | DRG: 389 | Disposition: A | Discharge status: Home / Self Care | Payer: Medicare Other | Attending: Internal Medicine | Admitting: Internal Medicine

## 2015-08-14 DIAGNOSIS — D701 Agranulocytosis secondary to cancer chemotherapy: Secondary | ICD-10-CM | POA: Diagnosis present

## 2015-08-14 DIAGNOSIS — C189 Malignant neoplasm of colon, unspecified: Secondary | ICD-10-CM | POA: Diagnosis present

## 2015-08-14 DIAGNOSIS — M1990 Unspecified osteoarthritis, unspecified site: Secondary | ICD-10-CM | POA: Diagnosis present

## 2015-08-14 DIAGNOSIS — F329 Major depressive disorder, single episode, unspecified: Secondary | ICD-10-CM | POA: Diagnosis present

## 2015-08-14 DIAGNOSIS — C787 Secondary malignant neoplasm of liver and intrahepatic bile duct: Secondary | ICD-10-CM | POA: Diagnosis present

## 2015-08-14 DIAGNOSIS — K56600 Partial intestinal obstruction, unspecified as to cause: Secondary | ICD-10-CM

## 2015-08-14 DIAGNOSIS — K56609 Unspecified intestinal obstruction, unspecified as to partial versus complete obstruction: Secondary | ICD-10-CM | POA: Diagnosis present

## 2015-08-14 DIAGNOSIS — I10 Essential (primary) hypertension: Secondary | ICD-10-CM | POA: Diagnosis present

## 2015-08-14 DIAGNOSIS — Z531 Procedure and treatment not carried out because of patient's decision for reasons of belief and group pressure: Secondary | ICD-10-CM | POA: Diagnosis present

## 2015-08-14 DIAGNOSIS — Z79899 Other long term (current) drug therapy: Secondary | ICD-10-CM

## 2015-08-14 DIAGNOSIS — Z932 Ileostomy status: Secondary | ICD-10-CM

## 2015-08-14 DIAGNOSIS — D6481 Anemia due to antineoplastic chemotherapy: Secondary | ICD-10-CM | POA: Diagnosis present

## 2015-08-14 DIAGNOSIS — Z8711 Personal history of peptic ulcer disease: Secondary | ICD-10-CM | POA: Diagnosis not present

## 2015-08-14 DIAGNOSIS — T451X5A Adverse effect of antineoplastic and immunosuppressive drugs, initial encounter: Secondary | ICD-10-CM | POA: Diagnosis present

## 2015-08-14 DIAGNOSIS — Z9049 Acquired absence of other specified parts of digestive tract: Secondary | ICD-10-CM

## 2015-08-14 DIAGNOSIS — K5669 Other intestinal obstruction: Secondary | ICD-10-CM | POA: Diagnosis not present

## 2015-08-14 DIAGNOSIS — R1084 Generalized abdominal pain: Secondary | ICD-10-CM | POA: Diagnosis present

## 2015-08-14 DIAGNOSIS — D61818 Other pancytopenia: Secondary | ICD-10-CM | POA: Diagnosis present

## 2015-08-14 DIAGNOSIS — K567 Ileus, unspecified: Secondary | ICD-10-CM | POA: Diagnosis not present

## 2015-08-14 DIAGNOSIS — R110 Nausea: Secondary | ICD-10-CM

## 2015-08-14 DIAGNOSIS — D696 Thrombocytopenia, unspecified: Secondary | ICD-10-CM | POA: Diagnosis present

## 2015-08-14 DIAGNOSIS — R109 Unspecified abdominal pain: Secondary | ICD-10-CM | POA: Diagnosis present

## 2015-08-14 DIAGNOSIS — D72819 Decreased white blood cell count, unspecified: Secondary | ICD-10-CM

## 2015-08-14 DIAGNOSIS — Z9841 Cataract extraction status, right eye: Secondary | ICD-10-CM

## 2015-08-14 DIAGNOSIS — R112 Nausea with vomiting, unspecified: Secondary | ICD-10-CM | POA: Diagnosis present

## 2015-08-14 HISTORY — DX: Essential (primary) hypertension: I10

## 2015-08-14 HISTORY — DX: Malignant neoplasm of colon, unspecified: C18.9

## 2015-08-14 LAB — CBC WITH DIFFERENTIAL/PLATELET
Basophils Absolute: 0 10*3/uL (ref 0.0–0.1)
Basophils Relative: 0 %
Eosinophils Absolute: 0.1 10*3/uL (ref 0.0–0.7)
Eosinophils Relative: 2 %
HEMATOCRIT: 31.9 % — AB (ref 39.0–52.0)
Hemoglobin: 10.2 g/dL — ABNORMAL LOW (ref 13.0–17.0)
LYMPHS PCT: 28 %
Lymphs Abs: 1.1 10*3/uL (ref 0.7–4.0)
MCH: 24.2 pg — ABNORMAL LOW (ref 26.0–34.0)
MCHC: 32 g/dL (ref 30.0–36.0)
MCV: 75.6 fL — AB (ref 78.0–100.0)
MONO ABS: 0.4 10*3/uL (ref 0.1–1.0)
MONOS PCT: 11 %
NEUTROS ABS: 2.3 10*3/uL (ref 1.7–7.7)
Neutrophils Relative %: 58 %
PLATELETS: 103 10*3/uL — AB (ref 150–400)
RBC: 4.22 MIL/uL (ref 4.22–5.81)
RDW: 16.7 % — AB (ref 11.5–15.5)
WBC: 3.9 10*3/uL — ABNORMAL LOW (ref 4.0–10.5)

## 2015-08-14 LAB — COMPREHENSIVE METABOLIC PANEL
ALT: 8 U/L — AB (ref 17–63)
ANION GAP: 5 (ref 5–15)
AST: 17 U/L (ref 15–41)
Albumin: 3.7 g/dL (ref 3.5–5.0)
Alkaline Phosphatase: 39 U/L (ref 38–126)
BUN: 7 mg/dL (ref 6–20)
CALCIUM: 9.8 mg/dL (ref 8.9–10.3)
CHLORIDE: 100 mmol/L — AB (ref 101–111)
CO2: 30 mmol/L (ref 22–32)
CREATININE: 0.79 mg/dL (ref 0.61–1.24)
Glucose, Bld: 110 mg/dL — ABNORMAL HIGH (ref 65–99)
Potassium: 3.7 mmol/L (ref 3.5–5.1)
SODIUM: 135 mmol/L (ref 135–145)
Total Bilirubin: 0.8 mg/dL (ref 0.3–1.2)
Total Protein: 6.3 g/dL — ABNORMAL LOW (ref 6.5–8.1)

## 2015-08-14 LAB — URINALYSIS, ROUTINE W REFLEX MICROSCOPIC
BILIRUBIN URINE: NEGATIVE
Glucose, UA: NEGATIVE mg/dL
Hgb urine dipstick: NEGATIVE
KETONES UR: NEGATIVE mg/dL
LEUKOCYTES UA: NEGATIVE
NITRITE: NEGATIVE
Protein, ur: NEGATIVE mg/dL
SPECIFIC GRAVITY, URINE: 1.031 — AB (ref 1.005–1.030)
pH: 8.5 — ABNORMAL HIGH (ref 5.0–8.0)

## 2015-08-14 LAB — I-STAT CG4 LACTIC ACID, ED
LACTIC ACID, VENOUS: 0.83 mmol/L (ref 0.5–2.0)
Lactic Acid, Venous: 0.58 mmol/L (ref 0.5–2.0)

## 2015-08-14 LAB — URINE MICROSCOPIC-ADD ON
Bacteria, UA: NONE SEEN
RBC / HPF: NONE SEEN RBC/hpf (ref 0–5)

## 2015-08-14 LAB — LIPASE, BLOOD: LIPASE: 17 U/L (ref 11–51)

## 2015-08-14 MED ORDER — FERROUS SULFATE 325 (65 FE) MG PO TABS
325.0000 mg | ORAL_TABLET | Freq: Two times a day (BID) | ORAL | Status: DC
  Administered 2015-08-15 – 2015-08-16 (×2): 325 mg via ORAL
  Filled 2015-08-14 (×5): qty 1

## 2015-08-14 MED ORDER — SODIUM CHLORIDE 0.9 % IV SOLN
Freq: Once | INTRAVENOUS | Status: AC
  Administered 2015-08-14: 10:00:00 via INTRAVENOUS

## 2015-08-14 MED ORDER — MORPHINE SULFATE (PF) 4 MG/ML IV SOLN
4.0000 mg | Freq: Once | INTRAVENOUS | Status: AC
  Administered 2015-08-14: 4 mg via INTRAVENOUS
  Filled 2015-08-14: qty 1

## 2015-08-14 MED ORDER — ONDANSETRON HCL 4 MG/2ML IJ SOLN
4.0000 mg | Freq: Once | INTRAMUSCULAR | Status: AC
  Administered 2015-08-14: 4 mg via INTRAVENOUS
  Filled 2015-08-14: qty 2

## 2015-08-14 MED ORDER — LIDOCAINE HCL 2 % EX GEL
1.0000 "application " | Freq: Once | CUTANEOUS | Status: AC
  Administered 2015-08-14: 1
  Filled 2015-08-14: qty 11

## 2015-08-14 MED ORDER — ONDANSETRON HCL 4 MG/2ML IJ SOLN
4.0000 mg | Freq: Four times a day (QID) | INTRAMUSCULAR | Status: DC | PRN
Start: 2015-08-14 — End: 2015-08-16

## 2015-08-14 MED ORDER — ONDANSETRON HCL 4 MG PO TABS: 4.0000 mg | ORAL_TABLET | Freq: Four times a day (QID) | ORAL | Status: DC | PRN

## 2015-08-14 MED ORDER — ACETAMINOPHEN 500 MG PO TABS: 500.0000 mg | ORAL_TABLET | ORAL | Status: DC | PRN

## 2015-08-14 MED ORDER — HYDROMORPHONE HCL 1 MG/ML IJ SOLN
0.5000 mg | INTRAMUSCULAR | Status: DC | PRN
  Administered 2015-08-14: 0.5 mg via INTRAVENOUS
  Filled 2015-08-14: qty 1

## 2015-08-14 MED ORDER — IOHEXOL 300 MG/ML  SOLN
50.0000 mL | Freq: Once | INTRAMUSCULAR | Status: DC | PRN
  Administered 2015-08-14: 50 mL via ORAL
  Filled 2015-08-14: qty 50

## 2015-08-14 MED ORDER — SODIUM CHLORIDE 0.9 % IV SOLN
INTRAVENOUS | Status: DC
  Administered 2015-08-14 – 2015-08-16 (×4): via INTRAVENOUS

## 2015-08-14 MED ORDER — ONDANSETRON HCL 4 MG/2ML IJ SOLN
4.0000 mg | Freq: Once | INTRAMUSCULAR | Status: AC
Start: 2015-08-14 — End: 2015-08-14
  Administered 2015-08-14: 4 mg via INTRAVENOUS
  Filled 2015-08-14: qty 2

## 2015-08-14 MED ORDER — IOHEXOL 300 MG/ML  SOLN
100.0000 mL | Freq: Once | INTRAMUSCULAR | Status: AC | PRN
  Administered 2015-08-14: 100 mL via INTRAVENOUS

## 2015-08-14 NOTE — ED Notes (Signed)
Pt vomited 300 mL; MD notified; MD states she will re-order meds

## 2015-08-14 NOTE — ED Notes (Signed)
Pt has port - would like that accessed.  

## 2015-08-14 NOTE — ED Notes (Signed)
Received phone call regarding pt's ostomy supplies: pt needs 4 pouches (#[id number]) and 4 rings [id number])

## 2015-08-14 NOTE — ED Notes (Signed)
Pt finished Omnipaque

## 2015-08-14 NOTE — Progress Notes (Signed)
Shortly after emptying ostomy bag- patient called out because ostomy filled quickly with over 600cc's of loose green stool and gas which busted the ostomy bag.  Materials called and ordered new ostomy bag.

## 2015-08-14 NOTE — H&P (Signed)
[facility] History and Physical  Harold Price [id number] DOB: 1929-10-13 DOA: 08/14/2015  Referring physician: ER physician:Dr. Brantley Stage PCP: [name], MD  Oncology: Dr. Benay Spice   Chief Complaint: abdominal pain, nausea and vomiting   HPI:  81 y.o. male with past medical history of colon cancer, currently undergoing chemotherapy (last infusion 08/06/2015, under Dr. Gearldine Shown care). Patient presented to [facility] hospital with worsening generalized abdominal pain associated with intractable nausea and vomiting over past 24 hours prior to this admission. No hematemesis. Patient reports abdominal pain as diffuse, inconsistently sharp and dull, intermittent, present at the rest, about 6 out of 10 in intensity. Patient reported abdominal pain better with analgesia given in ED. No associated fevers. No diarrhea or constipation. No blood in the stool or urine. No fevers. No chest pain or shortness of breath or palpitations.  In ED, patient was hemodynamically stable. Blood work demonstrated white blood cell count of 3.9, hemoglobin 10.2, platelets 103, normal renal function, normal lactic acid. CT abdomen demonstrated progressive multifocal hepatic metastatic disease, diffuse small bowel distention status post partial colectomy and ileostomy worrisome for partial small bowel obstruction. Surgery was consulted by ED physician.   Assessment & Plan    Principal Problem:   Pain, abdominal, generalized / Small bowel obstruction (HCC) / Nausea and vomiting in adult - Nausea and vomiting likely secondary to small bowel obstruction seen on CT abdomen. Likely in the setting of previous partial colectomy and ileostomy - Appreciate surgery input - Continue supportive care with IV fluids, antiemetics and analgesia as needed - Continue nothing by mouth for now   Active Problems:   Colon cancer [name]) - Informed pt's oncologist of his admission - On chemotherapy - last infusion  08/06/2015    Anemia due to antineoplastic chemotherapy / Leukopenia due to antineoplastic chemotherapy /Thrombocytopenia ([place]) - So far stable - Continue to monitor daily while pt in hospital    DVT prophylaxis:  - SCD's bilaterally   Radiological Exams on Admission: Ct Abdomen Pelvis W Contrast 08/14/2015 1. Progressive multifocal hepatic metastatic disease. 2. No definite extrahepatic tumor identified. Ascites has improved from 3 months ago, without peritoneal nodularity. 3. Diffuse small bowel distension status post partial colectomy and ileostomy, worrisome for partial small bowel obstruction. No clear demonstrated etiology. 4. These results will be called to the ordering clinician or representative by the Radiologist Assistant, and communication documented in the PACS or zVision Dashboard. Electronically Signed   By: Richardean Sale M.D.   On: 08/14/2015 11:52   Code Status: Full Family Communication: Plan of care discussed with the patient  Disposition Plan: Admit for further evaluation, medical floor   [name], MD  [facility] Pager [phone]  Time spent in minutes: 75 minutes  Review of Systems:  Constitutional: Negative for fever, chills and malaise/fatigue. Negative for diaphoresis.  HENT: Negative for hearing loss, ear pain, nosebleeds, congestion, sore throat, neck pain, tinnitus and ear discharge.   Eyes: Negative for blurred vision, double vision, photophobia, pain, discharge and redness.  Respiratory: Negative for cough, hemoptysis, sputum production, shortness of breath, wheezing and stridor.   Cardiovascular: Negative for chest pain, palpitations, orthopnea, claudication and leg swelling.  Gastrointestinal: per HPI  Genitourinary: Negative for dysuria, urgency, frequency, hematuria and flank pain.  Musculoskeletal: Negative for myalgias, back pain, joint pain and falls.  Skin: Negative for itching and rash.  Neurological: Negative for dizziness and  weakness. Negative for tingling, tremors, sensory change, speech change, focal weakness, loss of consciousness and headaches.  Endo/Heme/Allergies: Negative for environmental allergies and polydipsia. Does not bruise/bleed easily.  Psychiatric/Behavioral: Negative for suicidal ideas. The patient is not nervous/anxious.      Past Medical History  Diagnosis Date  . Hypertension   . PUD (peptic ulcer disease)     2006  . Depression   . Headache(784.0)   . Arthritis     general  . Alcohol abuse   . SBO (small bowel obstruction) ([place]) 04/2015  . Transfusion of blood product refused for religious reason   . Cancer [facility])    Past Surgical History  Procedure Laterality Date  . Cataract extraction Right   . Esophagogastroduodenoscopy N/A 11/06/2012    Procedure: ESOPHAGOGASTRODUODENOSCOPY (EGD);  Surgeon: Lear Ng, MD;  Location: [facility] ENDOSCOPY;  Service: Endoscopy;  Laterality: N/A;  . Esophagogastroduodenoscopy N/A 11/07/2012    Procedure: ESOPHAGOGASTRODUODENOSCOPY (EGD);  Surgeon: Lear Ng, MD;  Location: [facility] ENDOSCOPY;  Service: Endoscopy;  Laterality: N/A;  . Laparotomy N/A 04/20/2015    Procedure: EXPLORATORY LAPAROTOMY WITH PARTIAL COLECTOMY;  Surgeon: Rolm Bookbinder, MD;  Location: [place];  Service: General;  Laterality: N/A;  Exploratory lap with right colectomy, small bowel and sigmoid resection, ileostomy  . Colostomy    . Portacath placement Right 06/20/2015    Procedure: INSERTION PORT-A-CATH WITH ULTRASOUND;  Surgeon: Rolm Bookbinder, MD;  Location: [place];  Service: General;  Laterality: Right;   Social History:  reports that he has never smoked. He has never used smokeless tobacco. He reports that he drinks about 16.8 oz of alcohol per week. He reports that he does not use illicit drugs.  No Known Allergies  Family History:  Family History  Problem Relation Age of Onset  . Ulcers Brother      Prior to Admission medications    Medication Sig Start Date End Date Taking? Authorizing Provider  acetaminophen (TYLENOL) 500 MG tablet Take 500 mg by mouth every 4 (four) hours as needed for mild pain, moderate pain, fever or headache.   Yes Historical Provider, MD  calcium carbonate (TUMS - DOSED IN MG ELEMENTAL CALCIUM) 500 MG chewable tablet Chew 1 tablet by mouth 2 (two) times daily as needed for indigestion or heartburn.   Yes Historical Provider, MD  diphenoxylate-atropine (LOMOTIL) 2.5-0.025 MG tablet Take 2 tablets by mouth 4 (four) times daily as needed for diarrhea or loose stools. 07/24/15  Yes Owens Shark, NP  ferrous sulfate 325 (65 FE) MG tablet Take 325 mg by mouth 2 (two) times daily with a meal. 07/24/15  Yes Owens Shark, NP  HYDROcodone-acetaminophen (NORCO) 5-325 MG tablet Take 1 tablet by mouth every 6 (six) hours as needed for moderate pain. 06/20/15  Yes Rolm Bookbinder, MD  lidocaine-prilocaine (EMLA) cream Apply 1 application topically as needed. Apply 1 tablespoon to [facility] site and cover with plastic wrap 1 hour prior to chemo 06/15/15  Yes Ladell Pier, MD  loperamide (IMODIUM) 2 MG capsule Take 2 mg by mouth 3 (three) times daily.   Yes Historical Provider, MD  prochlorperazine (COMPAZINE) 10 MG tablet Take 1 tablet (10 mg total) by mouth every 6 (six) hours as needed for nausea. 06/15/15  Yes Ladell Pier, MD   Physical Exam: Filed Vitals:   08/14/15 1000 08/14/15 1104 08/14/15 1130 08/14/15 1200  BP: 168/84 174/73 154/75 133/70  Pulse: 69 72 63 63  Temp:      TempSrc:      Resp:  18    SpO2: 100% 98% 100%  100%    Physical Exam  Constitutional: Appears well-developed and well-nourished. No distress.  HENT: Normocephalic. No tonsillar erythema or exudates Eyes: Conjunctivae are normal. No scleral icterus.  Neck: Normal ROM. Neck supple. No JVD. No tracheal deviation. No thyromegaly.  CVS: RRR, S1/S2 +, no murmurs, no gallops, no carotid bruit.  Pulmonary: Effort and breath sounds normal,  no stridor, rhonchi, wheezes, rales.  Abdominal: Soft. BS +,  no distension, tenderness, rebound or guarding.  Musculoskeletal: Normal range of motion. No edema and no tenderness.  Lymphadenopathy: No lymphadenopathy noted, cervical, inguinal. Neuro: Alert. Normal reflexes, muscle tone coordination. No focal neurologic deficits. Skin: Skin is warm and dry. No rash noted.  No erythema. No pallor.  Psychiatric: Normal mood and affect. Behavior, judgment, thought content normal.   Labs on Admission:  Basic Metabolic Panel:  Recent Labs Lab 08/10/15 1353 08/14/15 0926  NA 135 135  K 3.2* 3.7  CL 99* 100*  CO2  --  30  GLUCOSE 108* 110*  BUN 9 7  CREATININE 0.80 0.79  CALCIUM  --  9.8   Liver Function Tests:  Recent Labs Lab 08/14/15 0926  AST 17  ALT 8*  ALKPHOS 39  BILITOT 0.8  PROT 6.3*  ALBUMIN 3.7    Recent Labs Lab 08/14/15 0926  LIPASE 17   No results for input(s): AMMONIA in the last 168 hours. CBC:  Recent Labs Lab 08/10/15 1333 08/10/15 1353 08/14/15 0926  WBC 2.8*  --  3.9*  NEUTROABS 1.6*  --  2.3  HGB 9.8* 11.2* 10.2*  HCT 30.4* 33.0* 31.9*  MCV 75.2*  --  75.6*  PLT 69*  --  103*   Cardiac Enzymes: No results for input(s): CKTOTAL, CKMB, CKMBINDEX, TROPONINI in the last 168 hours. BNP: Invalid input(s): POCBNP CBG: No results for input(s): GLUCAP in the last 168 hours.  If 7PM-7AM, please contact night-coverage [email or web address] Password [email or web address] 08/14/2015, 1:33 PM

## 2015-08-14 NOTE — ED Notes (Signed)
Labs delayed d/t port stick.  Pt refused peripheral stick.

## 2015-08-14 NOTE — Progress Notes (Signed)
NG tube placed in ED before patient arrival to unit.  NG tube order was discontinued.  Spoke with Charlies Silvers, MD - per verbal order keep NG tube in (not connected to suction)- in case tube is needed at a later time.

## 2015-08-14 NOTE — Consult Note (Signed)
Harold Price 10-16-1929  [id number].   Requesting MD: Dr. [name] Chief Complaint/Reason for Consult: pSBO HPI: This is an 81 yo black male who underwent a right hemicolectomy, sigmoid colectomy, SBR, and ileostomy in August of this year for an obstructing colon cancer with evidence of metastatic disease.  Since then, he has started chemotherapy with FOLFOX.  He has completed 4 rounds so far and is scheduled for his fifth on Dec 12.  He states that he has done well since his surgery back in August with no major abdominal issues.  He has been eating some, but felt like his appetite was better yesterday and increased what he ate.  He had an apple along with 3 bananas and other things.  Later last night around 2000, he started having some abdominal pain.  He has had some nausea, but no emesis until he drank contrast for his CT scan here today.  He presented to the ED for evaluation as tylenol and pain meds didn't seem to help last night.  His CT scan here shows some dilated small bowel, but no definite transition point.  He is also noted to have some progression of he hepatic metastatic disease as well.  He still has some liquid output in his ileostomy and some air.  He is being admitted by medicine and we have been asked to see him.  ROS : Please see HPI, otherwise negative  Family History  Problem Relation Age of Onset  . Ulcers Brother     Past Medical History  Diagnosis Date  . Primary colon cancer with metastasis to other site [facility])   . HTN (hypertension)     Past Surgical History  Procedure Laterality Date  . Cataract extraction Right   . Esophagogastroduodenoscopy N/A 11/06/2012    Procedure: ESOPHAGOGASTRODUODENOSCOPY (EGD);  Surgeon: Lear Ng, MD;  Location: [facility] ENDOSCOPY;  Service: Endoscopy;  Laterality: N/A;  . Esophagogastroduodenoscopy N/A 11/07/2012    Procedure: ESOPHAGOGASTRODUODENOSCOPY (EGD);  Surgeon: Lear Ng, MD;  Location: [facility] ENDOSCOPY;  Service:  Endoscopy;  Laterality: N/A;  . Laparotomy N/A 04/20/2015    Procedure: EXPLORATORY LAPAROTOMY WITH PARTIAL COLECTOMY;  Surgeon: Rolm Bookbinder, MD;  Location: [place];  Service: General;  Laterality: N/A;  Exploratory lap with right colectomy, small bowel and sigmoid resection, ileostomy  . Colostomy    . Portacath placement Right 06/20/2015    Procedure: INSERTION PORT-A-CATH WITH ULTRASOUND;  Surgeon: Rolm Bookbinder, MD;  Location: [place];  Service: General;  Laterality: Right;    Social History:  reports that he has never smoked. He has never used smokeless tobacco. He reports that he drinks about 16.8 oz of alcohol per week. He reports that he does not use illicit drugs.  Allergies: No Known Allergies   (Not in a hospital admission)  Blood pressure 99/64, pulse 75, temperature 97.8 F (36.6 C), temperature source Oral, resp. rate 18, SpO2 99 %. Physical Exam: General: pleasant, WD, WN black male who is laying in bed in NAD HEENT: head is normocephalic, atraumatic.  Sclera are noninjected.  PERRL.  Ears and nose without any masses or lesions.  Mouth is pink and moist Heart: regular, rate, and rhythm.  Normal s1,s2. No obvious murmurs, gallops, or rubs noted.  Palpable radial and pedal pulses bilaterally Lungs: CTAB, no wheezes, rhonchi, or rales noted.  Respiratory effort nonlabored Abd: soft, slightly tender just superior to his ileostomy, ND, +BS, no masses, hernias, or organomegaly.  RLQ ileostomy in place, stoma not  visible due to an opaque bag.  His bag however has liquid output along with some air.  It is leaking as well. MS: all 4 extremities are symmetrical with no cyanosis, clubbing, or edema. Skin: warm and dry with no masses, lesions, or rashes Psych: A&Ox3 with an appropriate affect.    Results for orders placed or performed during the hospital encounter of 08/14/15 (from the past 48 hour(s))  Lipase, blood     Status: None   Collection Time:  08/14/15  9:26 AM  Result Value Ref Range   Lipase 17 11 - 51 U/L  Comprehensive metabolic panel     Status: Abnormal   Collection Time: 08/14/15  9:26 AM  Result Value Ref Range   Sodium 135 135 - 145 mmol/L   Potassium 3.7 3.5 - 5.1 mmol/L   Chloride 100 (L) 101 - 111 mmol/L   CO2 30 22 - 32 mmol/L   Glucose, Bld 110 (H) 65 - 99 mg/dL   BUN 7 6 - 20 mg/dL   Creatinine, Ser 0.79 0.61 - 1.24 mg/dL   Calcium 9.8 8.9 - 10.3 mg/dL   Total Protein 6.3 (L) 6.5 - 8.1 g/dL   Albumin 3.7 3.5 - 5.0 g/dL   AST 17 15 - 41 U/L   ALT 8 (L) 17 - 63 U/L   Alkaline Phosphatase 39 38 - 126 U/L   Total Bilirubin 0.8 0.3 - 1.2 mg/dL   GFR calc non Af Amer >60 >60 mL/min   GFR calc Af Amer >60 >60 mL/min    Comment: (NOTE) The eGFR has been calculated using the CKD EPI equation. This calculation has not been validated in all clinical situations. eGFR's persistently <60 mL/min signify possible Chronic Kidney Disease.    Anion gap 5 5 - 15  CBC with Differential     Status: Abnormal   Collection Time: 08/14/15  9:26 AM  Result Value Ref Range   WBC 3.9 (L) 4.0 - 10.5 K/uL   RBC 4.22 4.22 - 5.81 MIL/uL   Hemoglobin 10.2 (L) 13.0 - 17.0 g/dL   HCT 31.9 (L) 39.0 - 52.0 %   MCV 75.6 (L) 78.0 - 100.0 fL   MCH 24.2 (L) 26.0 - 34.0 pg   MCHC 32.0 30.0 - 36.0 g/dL   RDW 16.7 (H) 11.5 - 15.5 %   Platelets 103 (L) 150 - 400 K/uL    Comment: REPEATED TO VERIFY CONSISTENT WITH PREVIOUS RESULT    Neutrophils Relative % 58 %   Neutro Abs 2.3 1.7 - 7.7 K/uL   Lymphocytes Relative 28 %   Lymphs Abs 1.1 0.7 - 4.0 K/uL   Monocytes Relative 11 %   Monocytes Absolute 0.4 0.1 - 1.0 K/uL   Eosinophils Relative 2 %   Eosinophils Absolute 0.1 0.0 - 0.7 K/uL   Basophils Relative 0 %   Basophils Absolute 0.0 0.0 - 0.1 K/uL  I-Stat CG4 Lactic Acid, ED     Status: None   Collection Time: 08/14/15  9:40 AM  Result Value Ref Range   Lactic Acid, Venous 0.83 0.5 - 2.0 mmol/L  I-Stat CG4 Lactic Acid, ED      Status: None   Collection Time: 08/14/15 11:48 AM  Result Value Ref Range   Lactic Acid, Venous 0.58 0.5 - 2.0 mmol/L  Urinalysis, Routine w reflex microscopic (not at [facility])     Status: Abnormal   Collection Time: 08/14/15 12:32 PM  Result Value Ref Range   Color, Urine YELLOW  YELLOW   APPearance TURBID (A) CLEAR   Specific Gravity, Urine 1.031 (H) 1.005 - 1.030   pH 8.5 (H) 5.0 - 8.0   Glucose, UA NEGATIVE NEGATIVE mg/dL   Hgb urine dipstick NEGATIVE NEGATIVE   Bilirubin Urine NEGATIVE NEGATIVE   Ketones, ur NEGATIVE NEGATIVE mg/dL   Protein, ur NEGATIVE NEGATIVE mg/dL   Nitrite NEGATIVE NEGATIVE   Leukocytes, UA NEGATIVE NEGATIVE  Urine microscopic-add on     Status: Abnormal   Collection Time: 08/14/15 12:32 PM  Result Value Ref Range   Squamous Epithelial / LPF 0-5 (A) NONE SEEN   WBC, UA 0-5 0 - 5 WBC/hpf   RBC / HPF NONE SEEN 0 - 5 RBC/hpf   Bacteria, UA NONE SEEN NONE SEEN   Ct Abdomen Pelvis W Contrast  08/14/2015  CLINICAL DATA:  Mid abdominal pain. History of colon cancer status post right hemicolectomy. EXAM: CT ABDOMEN AND PELVIS WITH CONTRAST TECHNIQUE: Multidetector CT imaging of the abdomen and pelvis was performed using the standard protocol following bolus administration of intravenous contrast. CONTRAST:  118m OMNIPAQUE IOHEXOL 300 MG/ML  SOLN COMPARISON:  CT 04/28/2015.  MRI 06/01/2015. FINDINGS: Lower chest: Interval improved aeration of the lung bases with mild residual atelectasis or scarring. No significant pleural effusion. Hepatobiliary: There is progressive multifocal hepatic metastatic disease. Index lesion in the dome of the right hepatic lobe measures 2.9 x 2.7 cm on image 12 (2.8 x 2.4 cm on MRI). Ill-defined lesion superior to the gallbladder measures 2.5 x 2.1 cm on image 23. There is a stable simple cyst measuring 2.6 cm in the medial segment of the left lobe on image number 21. No evidence of gallstones, gallbladder wall thickening or biliary dilatation.  Pancreas: Stable mild pancreatic atrophy. No focal mass lesion or significant ductal dilatation. Spleen: Normal in size without focal abnormality. Adrenals/Urinary Tract: Both adrenal glands appear normal. The kidneys appear unchanged. No evidence of renal mass or hydronephrosis. The bladder appears normal. Stomach/Bowel: Small hiatal hernia. Right lower quadrant ileostomy noted status post subtotal colectomy. There is mild diffuse dilatation of the small bowel with air-fluid levels. Small bowel feces sign noted in the right mid abdomen. There is no significant bowel wall thickening. Vascular/Lymphatic: There are no enlarged abdominal or pelvic lymph nodes. Small lymph nodes in the porta hepatis are stable, not pathologically enlarged. Stable mild aortoiliac atherosclerosis. Reproductive: Stable mild enlargement of the prostate gland. Other: Ascites has decreased in volume compared with the prior CT. A small amount of ascites remains, primarily in the right upper quadrant. No peritoneal nodularity identified. There is mild mesenteric edema. Musculoskeletal: No acute or significant osseous findings. Partially bridging osteophytes are again noted within the lumbar spine and sacroiliac joints. IMPRESSION: 1. Progressive multifocal hepatic metastatic disease. 2. No definite extrahepatic tumor identified. Ascites has improved from 3 months ago, without peritoneal nodularity. 3. Diffuse small bowel distension status post partial colectomy and ileostomy, worrisome for partial small bowel obstruction. No clear demonstrated etiology. 4. These results will be called to the ordering clinician or representative by the Radiologist Assistant, and communication documented in the PACS or zVision Dashboard. Electronically Signed   By: WRichardean SaleM.D.   On: 08/14/2015 11:52       Assessment/Plan 1. Stage 4 adenocarcinoma of colon with abdominal pain -the patient's CT scan does show some dilated small bowel, but he does  have enteric contrast and air in his ileostomy.  This would argue against at least a complete obstruction.  He may  have a partial obstruction or he may have eaten a higher amount of fiber than he normally does yesterday and developed and intermittent blockage that may resolved quickly.  We would recommend conservative treatment at this time.  If the patient continues to have emesis, then placement of an NGT would be recommended, but if his nausea has resolved, given he has content in his ostomy bag, this could be held for now. -his ileostomy bag is leaking and needs to be changed.  Will have WOC see him while he is here to assist the patient. -we will follow along.  Markey Deady E 08/14/2015, 2:25 PM Pager: [phone]

## 2015-08-14 NOTE — Progress Notes (Signed)
Per surgery verbal order keep NG attached to low intermittent suction until morning.

## 2015-08-14 NOTE — ED Notes (Signed)
Pt to CT

## 2015-08-14 NOTE — ED Provider Notes (Signed)
CSN: [id number]     Arrival date & time 08/14/15  0806 History   First MD Initiated Contact with Patient 08/14/15 [phone]     Chief Complaint  Patient presents with  . Abdominal Pain     (Consider location/radiation/quality/duration/timing/severity/associated sxs/prior Treatment) HPI  81 year old male who presents with abdominal pain History of colon cancer s/p FOLFOX CTX, last on 11/21 History of right hemicolectomy, sigmoid resection, and SBR w/ ileostomy. Developed abdominal pain yesterday evening. Initially felt bloated after eating at 4PM, followed by pain. Feels constipated.  This morning took Milk of Magnesia, without good effect. Pain comes and goes in waves. Reports periumbilical pain and travels outward.  Denies fevers but having chills. No nausea or vomiting. Abdomen feels tight and bloated.  Had bowel movement this morning that was very hard. Still has gas in his ostomy. Loose output for past few days before in his ostomy up until today. Decreased appetite today. No dysuria or flank pain   Past Medical History  Diagnosis Date  . Hypertension   . PUD (peptic ulcer disease)     2006  . Depression   . Headache(784.0)   . Arthritis     general  . Alcohol abuse   . SBO (small bowel obstruction) ([place]) 04/2015  . Transfusion of blood product refused for religious reason   . Cancer [facility])    Past Surgical History  Procedure Laterality Date  . Cataract extraction Right   . Esophagogastroduodenoscopy N/A 11/06/2012    Procedure: ESOPHAGOGASTRODUODENOSCOPY (EGD);  Surgeon: Lear Ng, MD;  Location: [facility] ENDOSCOPY;  Service: Endoscopy;  Laterality: N/A;  . Esophagogastroduodenoscopy N/A 11/07/2012    Procedure: ESOPHAGOGASTRODUODENOSCOPY (EGD);  Surgeon: Lear Ng, MD;  Location: [facility] ENDOSCOPY;  Service: Endoscopy;  Laterality: N/A;  . Laparotomy N/A 04/20/2015    Procedure: EXPLORATORY LAPAROTOMY WITH PARTIAL COLECTOMY;  Surgeon: Rolm Bookbinder, MD;   Location: [place];  Service: General;  Laterality: N/A;  Exploratory lap with right colectomy, small bowel and sigmoid resection, ileostomy  . Colostomy    . Portacath placement Right 06/20/2015    Procedure: INSERTION PORT-A-CATH WITH ULTRASOUND;  Surgeon: Rolm Bookbinder, MD;  Location: [place];  Service: General;  Laterality: Right;   Family History  Problem Relation Age of Onset  . Ulcers Brother    Social History  Substance Use Topics  . Smoking status: Never Smoker   . Smokeless tobacco: Never Used  . Alcohol Use: 16.8 oz/week    28 Cans of beer per week    Review of Systems 10/14 systems reviewed and are negative other than those stated in the HPI    Allergies  Review of patient's allergies indicates no known allergies.  Home Medications   Prior to Admission medications   Medication Sig Start Date End Date Taking? Authorizing Provider  acetaminophen (TYLENOL) 500 MG tablet Take 500 mg by mouth every 4 (four) hours as needed for mild pain, moderate pain, fever or headache.   Yes Historical Provider, MD  calcium carbonate (TUMS - DOSED IN MG ELEMENTAL CALCIUM) 500 MG chewable tablet Chew 1 tablet by mouth 2 (two) times daily as needed for indigestion or heartburn.   Yes Historical Provider, MD  diphenoxylate-atropine (LOMOTIL) 2.5-0.025 MG tablet Take 2 tablets by mouth 4 (four) times daily as needed for diarrhea or loose stools. 07/24/15  Yes Owens Shark, NP  ferrous sulfate 325 (65 FE) MG tablet Take 325 mg by mouth 2 (two) times daily with a  meal. 07/24/15  Yes Owens Shark, NP  HYDROcodone-acetaminophen (NORCO) 5-325 MG tablet Take 1 tablet by mouth every 6 (six) hours as needed for moderate pain. 06/20/15  Yes Rolm Bookbinder, MD  lidocaine-prilocaine (EMLA) cream Apply 1 application topically as needed. Apply 1 tablespoon to [facility] site and cover with plastic wrap 1 hour prior to chemo 06/15/15  Yes Ladell Pier, MD  loperamide (IMODIUM) 2 MG capsule  Take 2 mg by mouth 3 (three) times daily.   Yes Historical Provider, MD  prochlorperazine (COMPAZINE) 10 MG tablet Take 1 tablet (10 mg total) by mouth every 6 (six) hours as needed for nausea. 06/15/15  Yes Ladell Pier, MD   BP 154/75 mmHg  Pulse 63  Temp(Src) 97.8 F (36.6 C) (Oral)  Resp 18  SpO2 100% Physical Exam Physical Exam  Nursing note and vitals reviewed. Constitutional: Well developed, well nourished, non-toxic, and in no acute distress Head: Normocephalic and atraumatic.  Mouth/Throat: Oropharynx is clear and moist.  Neck: Normal range of motion. Neck supple.  Cardiovascular: Normal rate and regular rhythm.   Pulmonary/Chest: Effort normal and breath sounds normal.  Abdominal: Soft. Non-distended. There is right hemi-abdominal tenderness. There is no rebound and no guarding. ileostomy in RLQ with gas and no stool. Musculoskeletal: Normal range of motion.  Neurological: Alert, no facial droop, fluent speech, moves all extremities symmetrically Skin: Skin is warm and dry.  Psychiatric: Cooperative  ED Course  Procedures (including critical care time) Labs Review Labs Reviewed  COMPREHENSIVE METABOLIC PANEL - Abnormal; Notable for the following:    Chloride 100 (*)    Glucose, Bld 110 (*)    Total Protein 6.3 (*)    ALT 8 (*)    All other components within normal limits  URINALYSIS, ROUTINE W REFLEX MICROSCOPIC (NOT AT [facility]) - Abnormal; Notable for the following:    APPearance TURBID (*)    Specific Gravity, Urine 1.031 (*)    pH 8.5 (*)    All other components within normal limits  CBC WITH DIFFERENTIAL/PLATELET - Abnormal; Notable for the following:    WBC 3.9 (*)    Hemoglobin 10.2 (*)    HCT 31.9 (*)    MCV 75.6 (*)    MCH 24.2 (*)    RDW 16.7 (*)    Platelets 103 (*)    All other components within normal limits  URINE MICROSCOPIC-ADD ON - Abnormal; Notable for the following:    Squamous Epithelial / LPF 0-5 (*)    All other components within normal  limits  LIPASE, BLOOD  I-STAT CG4 LACTIC ACID, ED  I-STAT CG4 LACTIC ACID, ED    Imaging Review Ct Abdomen Pelvis W Contrast  08/14/2015  CLINICAL DATA:  Mid abdominal pain. History of colon cancer status post right hemicolectomy. EXAM: CT ABDOMEN AND PELVIS WITH CONTRAST TECHNIQUE: Multidetector CT imaging of the abdomen and pelvis was performed using the standard protocol following bolus administration of intravenous contrast. CONTRAST:  172mL OMNIPAQUE IOHEXOL 300 MG/ML  SOLN COMPARISON:  CT 04/28/2015.  MRI 06/01/2015. FINDINGS: Lower chest: Interval improved aeration of the lung bases with mild residual atelectasis or scarring. No significant pleural effusion. Hepatobiliary: There is progressive multifocal hepatic metastatic disease. Index lesion in the dome of the right hepatic lobe measures 2.9 x 2.7 cm on image 12 (2.8 x 2.4 cm on MRI). Ill-defined lesion superior to the gallbladder measures 2.5 x 2.1 cm on image 23. There is a stable simple cyst measuring 2.6 cm in the  medial segment of the left lobe on image number 21. No evidence of gallstones, gallbladder wall thickening or biliary dilatation. Pancreas: Stable mild pancreatic atrophy. No focal mass lesion or significant ductal dilatation. Spleen: Normal in size without focal abnormality. Adrenals/Urinary Tract: Both adrenal glands appear normal. The kidneys appear unchanged. No evidence of renal mass or hydronephrosis. The bladder appears normal. Stomach/Bowel: Small hiatal hernia. Right lower quadrant ileostomy noted status post subtotal colectomy. There is mild diffuse dilatation of the small bowel with air-fluid levels. Small bowel feces sign noted in the right mid abdomen. There is no significant bowel wall thickening. Vascular/Lymphatic: There are no enlarged abdominal or pelvic lymph nodes. Small lymph nodes in the porta hepatis are stable, not pathologically enlarged. Stable mild aortoiliac atherosclerosis. Reproductive: Stable mild  enlargement of the prostate gland. Other: Ascites has decreased in volume compared with the prior CT. A small amount of ascites remains, primarily in the right upper quadrant. No peritoneal nodularity identified. There is mild mesenteric edema. Musculoskeletal: No acute or significant osseous findings. Partially bridging osteophytes are again noted within the lumbar spine and sacroiliac joints. IMPRESSION: 1. Progressive multifocal hepatic metastatic disease. 2. No definite extrahepatic tumor identified. Ascites has improved from 3 months ago, without peritoneal nodularity. 3. Diffuse small bowel distension status post partial colectomy and ileostomy, worrisome for partial small bowel obstruction. No clear demonstrated etiology. 4. These results will be called to the ordering clinician or representative by the Radiologist Assistant, and communication documented in the PACS or zVision Dashboard. Electronically Signed   By: Richardean Sale M.D.   On: 08/14/2015 11:52   I have personally reviewed and evaluated these images and lab results as part of my medical decision-making.    MDM   Final diagnoses:  Partial small bowel obstruction (HCC)  Pancytopenia (HCC)  Malignant neoplasm of colon, unspecified part of colon ([place])  Hepatic metastases ([place])    81 year old male with history of colon cancer status post chemotherapy, history of right hemicolectomy with sigmoid resection and small bowel resection with ileostomy who presents with abdominal pain and constipation. Vital signs are non-concerning, and he is in no acute distress. Abdomen is soft and non-peritoneal, but with primarily right hemi-abdominal tenderness to palpation. CT abdomen pelvis suggestive of partial small bowel obstruction without transition point. Also evidence of progressive hepatic metastasis. Basic blood work reveals normal lactate. He has baseline pancytopenia. No major electrolyte or metabolic derangements. Has persistent nausea and  abdominal pain, despite analgesia and anti-emetics. We'll place NG tube. Placed on maintenance IVF. Will admit to Dr. Charlies Silvers for ongoing management. Spoke to Saverio Danker, surgical PA who will consult for Dr. Manning Charity, MD 08/14/15 [phone]

## 2015-08-14 NOTE — ED Notes (Addendum)
Pt states his pain is returning

## 2015-08-14 NOTE — Consult Note (Addendum)
WOC ostomy consult note Stoma type/location: RLQ end ileostomy. Last seen by my partner in the ED Su Ley) on 04/27/15. Alerted to the fact that patient's pouch was leaking by K. Maxwell Caul, CCS PA. Stomal assessment/size: Not seen today, last measurement was 1 and 5/8 inches Peristomal assessment: Not seen today Treatment options for stomal/peristomal skin: Skin barrier ring Output: Pouch leaking brown/green effluent. Ostomy pouching: 1pc.flat flexible ostomy pouch, Kellie Simmering [phone]  Education provided: None Enrolled patient in [place] program: No (done during a previous admission) Current pouching system is leaking.  Patient is being worked up for obstruction, has no supplies.  Information provided to ED bedside RN for obtaining of supplies.  Orders provided in the event patient is admitted.[place] nursing team will not follow, but will remain available to this patient, the nursing, surgical and medical teams.  Please re-consult if needed. Thanks, Maudie Flakes, MSN, RN, [place], Arther Abbott  Pager# [phone]

## 2015-08-14 NOTE — Progress Notes (Signed)
Utilization review completed.  L. J. Sander Speckman RN, BSN, CM 

## 2015-08-14 NOTE — ED Notes (Signed)
Per ot, states abdominal pain that started last night-doesn't know if he is constipated-states stool is hard-states appetite has increased

## 2015-08-15 DIAGNOSIS — D509 Iron deficiency anemia, unspecified: Secondary | ICD-10-CM

## 2015-08-15 DIAGNOSIS — K5660 Unspecified intestinal obstruction: Principal | ICD-10-CM

## 2015-08-15 DIAGNOSIS — R109 Unspecified abdominal pain: Secondary | ICD-10-CM

## 2015-08-15 DIAGNOSIS — K567 Ileus, unspecified: Secondary | ICD-10-CM

## 2015-08-15 DIAGNOSIS — K5669 Other intestinal obstruction: Secondary | ICD-10-CM

## 2015-08-15 DIAGNOSIS — D6481 Anemia due to antineoplastic chemotherapy: Secondary | ICD-10-CM

## 2015-08-15 DIAGNOSIS — R1084 Generalized abdominal pain: Secondary | ICD-10-CM

## 2015-08-15 DIAGNOSIS — T451X5A Adverse effect of antineoplastic and immunosuppressive drugs, initial encounter: Secondary | ICD-10-CM

## 2015-08-15 DIAGNOSIS — C189 Malignant neoplasm of colon, unspecified: Secondary | ICD-10-CM

## 2015-08-15 DIAGNOSIS — D6959 Other secondary thrombocytopenia: Secondary | ICD-10-CM

## 2015-08-15 DIAGNOSIS — R222 Localized swelling, mass and lump, trunk: Secondary | ICD-10-CM

## 2015-08-15 LAB — BASIC METABOLIC PANEL
Anion gap: 4 — ABNORMAL LOW (ref 5–15)
BUN: 7 mg/dL (ref 6–20)
CALCIUM: 9 mg/dL (ref 8.9–10.3)
CO2: 28 mmol/L (ref 22–32)
CREATININE: 0.89 mg/dL (ref 0.61–1.24)
Chloride: 103 mmol/L (ref 101–111)
GFR calc Af Amer: >60 mL/min (ref >60)
GFR calc non Af Amer: >60 mL/min (ref >60)
GLUCOSE: 98 mg/dL (ref 65–99)
Potassium: 3.6 mmol/L (ref 3.5–5.1)
Sodium: 135 mmol/L (ref 135–145)

## 2015-08-15 LAB — CBC
HCT: 32.2 % — ABNORMAL LOW (ref 39.0–52.0)
HEMOGLOBIN: 10.2 g/dL — AB (ref 13.0–17.0)
MCH: 23.7 pg — AB (ref 26.0–34.0)
MCHC: 31.7 g/dL (ref 30.0–36.0)
MCV: 74.7 fL — ABNORMAL LOW (ref 78.0–100.0)
PLATELETS: 103 10*3/uL — AB (ref 150–400)
RBC: 4.31 MIL/uL (ref 4.22–5.81)
RDW: 16.6 % — ABNORMAL HIGH (ref 11.5–15.5)
WBC: 4.9 10*3/uL (ref 4.0–10.5)

## 2015-08-15 LAB — MAGNESIUM: MAGNESIUM: 2.1 mg/dL (ref 1.7–2.4)

## 2015-08-15 MED ORDER — BOOST / RESOURCE BREEZE PO LIQD
1.0000 | Freq: Three times a day (TID) | ORAL | Status: DC
  Administered 2015-08-15 – 2015-08-16 (×3): 1 via ORAL

## 2015-08-15 MED ORDER — PHENOL 1.4 % MT LIQD
1.0000 | OROMUCOSAL | Status: DC | PRN
  Filled 2015-08-15: qty 177

## 2015-08-15 NOTE — Progress Notes (Addendum)
Initial Nutrition Assessment  DOCUMENTATION CODES:   Not applicable  INTERVENTION:  - Will order Boost Breeze TID, each supplement provides 250 kcal and 9 grams of protein - Diet advancement as medically feasible - RD will continue to monitor for needs  NUTRITION DIAGNOSIS:   Inadequate protein intake related to other (see comment) (current diet order) as evidenced by other (see comment) (CLD does not meet estimated protein needs).  GOAL:   Patient will meet greater than or equal to 90% of their needs  MONITOR:   Diet advancement, Weight trends, Labs, I & O's  REASON FOR ASSESSMENT:   Malnutrition Screening Tool  ASSESSMENT:   81 y.o. male with past medical history of colon cancer, currently undergoing chemotherapy (last infusion 08/06/2015, under Dr. Gearldine Shown care). Patient presented to [facility] long hospital with worsening generalized abdominal pain associated with intractable nausea and vomiting over past 24 hours prior to this admission. No hematemesis. Patient reports abdominal pain as diffuse, inconsistently sharp and dull, intermittent, present at the rest, about 6 out of 10 in intensity. Patient reported abdominal pain better with analgesia given in ED. No associated fevers. No diarrhea or constipation.  Pt seen for MST. BMI indicates normal weight. Diet recently advanced from NPO to CLD and pt states he had some apple juice; he denies abdominal pain or nausea associated with intake of juice.  NGT has been removed and pt denies any abdominal discomfort since that time. He states that PTA he had a very good appetite and that he recently started eating "heavier" foods. He did not have any teeth at time of assessment but states he has dentures present in hospital room. He denies chewing issues when dentures are in place and denies swallowing issues.   He states UBW of 196-200 lbs and denies recent weight fluctuations. Per chart review, pt has lost 12 lbs (6% body weight) in  the past 1 month which is significant for time frame. No muscle or fat wasting present at this time; will continue to monitor.  Will order Boost Breeze to supplement CLD and adjust as needed with diet advancement. Not able to meet needs on CLD. Medications reviewed. Labs reviewed.  ADDENDUM: Pt denies overt taste alterations while on chemo. He states that some foods taste more bland than they had previously tasted.     Diet Order:  Diet clear liquid Room service appropriate?: Yes; Fluid consistency:: Thin  Skin:  Reviewed, no issues  Last BM:  11/29  Height:   Ht Readings from Last 1 Encounters:  08/14/15 6\' 2"  (1.88 m)    Weight:   Wt Readings from Last 1 Encounters:  08/15/15 192 lb 7.4 oz (87.3 kg)    Ideal Body Weight:  86.36 kg (kg)  BMI:  Body mass index is 24.7 kg/(m^2).  Estimated Nutritional Needs:   Kcal:  2600-2800  Protein:  105-115 grams  Fluid:  >/= 2.2 L/day  EDUCATION NEEDS:   No education needs identified at this time     Jarome Matin, RD, LDN Inpatient Clinical Dietitian Pager # [phone] After hours/weekend pager # [phone]

## 2015-08-15 NOTE — Progress Notes (Signed)
IP PROGRESS NOTE  Subjective:   Harold Price is known to me with a history of metastatic colon cancer. He completed cycle 4 FOLFOX on 08/08/2015. He was seen in the emergency room dyspnea on 08/10/2015. He returned emergency room with abdominal pain on 08/14/2015. A CT of the abdomen and pelvis on 08/14/2015 revealed evidence of increased size of liver metastases, and diffuse dilation of the small bowel consistent with a partial small bowel obstruction.  He was admitted. He reports feeling better after placement of an NG tube. He had a large output of stool from the ileostomy overnight and feels better.  He reports cold sensitivity following chemotherapy.  Objective: Vital signs in last 24 hours: Blood pressure 145/76, pulse 65, temperature 97.2 F (36.2 C), temperature source Oral, resp. rate 18, height $RemoveBe'6\' 2"'CshtkeOWW$  (1.88 m), weight 192 lb 7.4 oz (87.3 kg), SpO2 100 %.  Intake/Output from previous day: 11/29 0701 - 11/30 0700 In: 660 [I.V.:660] Out: 1150 [Drains:1150]  Physical Exam:  HEENT: No thrush or ulcers Lungs: Clear bilaterally Cardiac: Regular rate and rhythm Abdomen: Mild tenderness in the right greater than left mid abdomen, no mass, no hepatomegaly, performed stool in the ileostomy bag Extremities: No leg edema   Portacath/PICC-without erythema  Lab Results:  Recent Labs  08/14/15 0926 08/15/15 0755  WBC 3.9* 4.9  HGB 10.2* 10.2*  HCT 31.9* 32.2*  PLT 103* 103*    BMET  Recent Labs  08/14/15 0926 08/15/15 0755  NA 135 135  K 3.7 3.6  CL 100* 103  CO2 30 28  GLUCOSE 110* 98  BUN 7 7  CREATININE 0.79 0.89  CALCIUM 9.8 9.0    Studies/Results: Ct Abdomen Pelvis W Contrast  08/14/2015  CLINICAL DATA:  Mid abdominal pain. History of colon cancer status post right hemicolectomy. EXAM: CT ABDOMEN AND PELVIS WITH CONTRAST TECHNIQUE: Multidetector CT imaging of the abdomen and pelvis was performed using the standard protocol following bolus administration of  intravenous contrast. CONTRAST:  159mL OMNIPAQUE IOHEXOL 300 MG/ML  SOLN COMPARISON:  CT 04/28/2015.  MRI 06/01/2015. FINDINGS: Lower chest: Interval improved aeration of the lung bases with mild residual atelectasis or scarring. No significant pleural effusion. Hepatobiliary: There is progressive multifocal hepatic metastatic disease. Index lesion in the dome of the right hepatic lobe measures 2.9 x 2.7 cm on image 12 (2.8 x 2.4 cm on MRI). Ill-defined lesion superior to the gallbladder measures 2.5 x 2.1 cm on image 23. There is a stable simple cyst measuring 2.6 cm in the medial segment of the left lobe on image number 21. No evidence of gallstones, gallbladder wall thickening or biliary dilatation. Pancreas: Stable mild pancreatic atrophy. No focal mass lesion or significant ductal dilatation. Spleen: Normal in size without focal abnormality. Adrenals/Urinary Tract: Both adrenal glands appear normal. The kidneys appear unchanged. No evidence of renal mass or hydronephrosis. The bladder appears normal. Stomach/Bowel: Small hiatal hernia. Right lower quadrant ileostomy noted status post subtotal colectomy. There is mild diffuse dilatation of the small bowel with air-fluid levels. Small bowel feces sign noted in the right mid abdomen. There is no significant bowel wall thickening. Vascular/Lymphatic: There are no enlarged abdominal or pelvic lymph nodes. Small lymph nodes in the porta hepatis are stable, not pathologically enlarged. Stable mild aortoiliac atherosclerosis. Reproductive: Stable mild enlargement of the prostate gland. Other: Ascites has decreased in volume compared with the prior CT. A small amount of ascites remains, primarily in the right upper quadrant. No peritoneal nodularity identified. There is mild  mesenteric edema. Musculoskeletal: No acute or significant osseous findings. Partially bridging osteophytes are again noted within the lumbar spine and sacroiliac joints. IMPRESSION: 1. Progressive  multifocal hepatic metastatic disease. 2. No definite extrahepatic tumor identified. Ascites has improved from 3 months ago, without peritoneal nodularity. 3. Diffuse small bowel distension status post partial colectomy and ileostomy, worrisome for partial small bowel obstruction. No clear demonstrated etiology. 4. These results will be called to the ordering clinician or representative by the Radiologist Assistant, and communication documented in the PACS or zVision Dashboard. Electronically Signed   By: Richardean Sale M.D.   On: 08/14/2015 11:52    Medications: I have reviewed the patient's current medications.  Assessment/Plan: 1. 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 Bleeding duodenal ulcer February 2014  3. History of heavy alcohol use  4. Enterobacter bacteremia following surgery August 2016  5. Microcytic anemia secondary to #1. He will begin ferrous sulfate 325 mg twice daily.  6. CT chest 05/15/2015-negative for metastatic disease, fatty mass at the left chest wall-low-grade neoplasm not excluded  7. Port-A-Cath placement 06/20/2015  8.    Admission with a partial small bowel dysfunction versus ileus 08/14/2015-improved with NG tube decompression  9.    Thrombocytopenia secondary to chemotherapy   Mr. Davids has completed 4 cycles of FOLFOX chemotherapy. He has tolerated chemotherapy well. The CT 08/14/2015 suggest progressive disease in the liver. There was a 1-2 month gap between the baseline CT and initiation of  chemotherapy. I will review the images in radiology and decide on whether to discontinue FOLFOX. He is scheduled for outpatient follow-up on 08/20/2015. Please call oncology as needed while he is in the hospital. I will discuss the CT review with Mr. Renfroe.     LOS: 1 day   Monette Omara  08/15/2015, 1:20 PM

## 2015-08-15 NOTE — Progress Notes (Signed)
Patient ID: Harold Price, male   DOB: June 04, 1930, 81 y.o.   MRN: [id number]    Subjective: Had a large amount of ileostomy output last night. Denies any abdominal pain or bloating or other complaints this morning.  Objective: Vital signs in last 24 hours: Temp:  [97.2 F (36.2 C)-97.5 F (36.4 C)] 97.2 F (36.2 C) (11/30 0530) Pulse Rate:  [62-78] 65 (11/30 0530) Resp:  [18] 18 (11/30 0530) BP: (99-174)/(64-84) 145/76 mmHg (11/30 0530) SpO2:  [98 %-100 %] 100 % (11/30 0530) Weight:  [86.5 kg (190 lb 11.2 oz)-87.3 kg (192 lb 7.4 oz)] 87.3 kg (192 lb 7.4 oz) (11/30 0530) Last BM Date: 08/14/15  Intake/Output from previous day: 11/29 0701 - 11/30 0700 In: 660 [I.V.:660] Out: 1150 [Drains:1150] Intake/Output this shift:    General appearance: alert, cooperative and no distress GI: normal findings: soft, non-tender and and nondistended. Solid stool in the ileostomy bag.  Lab Results:   Recent Labs  08/14/15 0926 08/15/15 0755  WBC 3.9* 4.9  HGB 10.2* 10.2*  HCT 31.9* 32.2*  PLT 103* 103*   BMET  Recent Labs  08/14/15 0926 08/15/15 0755  NA 135 135  K 3.7 3.6  CL 100* 103  CO2 30 28  GLUCOSE 110* 98  BUN 7 7  CREATININE 0.79 0.89  CALCIUM 9.8 9.0     Studies/Results: Ct Abdomen Pelvis W Contrast  08/14/2015  CLINICAL DATA:  Mid abdominal pain. History of colon cancer status post right hemicolectomy. EXAM: CT ABDOMEN AND PELVIS WITH CONTRAST TECHNIQUE: Multidetector CT imaging of the abdomen and pelvis was performed using the standard protocol following bolus administration of intravenous contrast. CONTRAST:  111mL OMNIPAQUE IOHEXOL 300 MG/ML  SOLN COMPARISON:  CT 04/28/2015.  MRI 06/01/2015. FINDINGS: Lower chest: Interval improved aeration of the lung bases with mild residual atelectasis or scarring. No significant pleural effusion. Hepatobiliary: There is progressive multifocal hepatic metastatic disease. Index lesion in the dome of the right hepatic lobe measures  2.9 x 2.7 cm on image 12 (2.8 x 2.4 cm on MRI). Ill-defined lesion superior to the gallbladder measures 2.5 x 2.1 cm on image 23. There is a stable simple cyst measuring 2.6 cm in the medial segment of the left lobe on image number 21. No evidence of gallstones, gallbladder wall thickening or biliary dilatation. Pancreas: Stable mild pancreatic atrophy. No focal mass lesion or significant ductal dilatation. Spleen: Normal in size without focal abnormality. Adrenals/Urinary Tract: Both adrenal glands appear normal. The kidneys appear unchanged. No evidence of renal mass or hydronephrosis. The bladder appears normal. Stomach/Bowel: Small hiatal hernia. Right lower quadrant ileostomy noted status post subtotal colectomy. There is mild diffuse dilatation of the small bowel with air-fluid levels. Small bowel feces sign noted in the right mid abdomen. There is no significant bowel wall thickening. Vascular/Lymphatic: There are no enlarged abdominal or pelvic lymph nodes. Small lymph nodes in the porta hepatis are stable, not pathologically enlarged. Stable mild aortoiliac atherosclerosis. Reproductive: Stable mild enlargement of the prostate gland. Other: Ascites has decreased in volume compared with the prior CT. A small amount of ascites remains, primarily in the right upper quadrant. No peritoneal nodularity identified. There is mild mesenteric edema. Musculoskeletal: No acute or significant osseous findings. Partially bridging osteophytes are again noted within the lumbar spine and sacroiliac joints. IMPRESSION: 1. Progressive multifocal hepatic metastatic disease. 2. No definite extrahepatic tumor identified. Ascites has improved from 3 months ago, without peritoneal nodularity. 3. Diffuse small bowel distension status post partial colectomy  and ileostomy, worrisome for partial small bowel obstruction. No clear demonstrated etiology. 4. These results will be called to the ordering clinician or representative by the  Radiologist Assistant, and communication documented in the PACS or zVision Dashboard. Electronically Signed   By: Richardean Sale M.D.   On: 08/14/2015 11:52    Anti-infectives: Anti-infectives    None      Assessment/Plan: Partial small bowel obstruction versus ileus which clinically appears to be resolving. I will discontinue NG tube and start clear liquid diet. I doubt any surgical issues.    LOS: 1 day    Jerie Basford T 08/15/2015

## 2015-08-15 NOTE — Care Management Note (Signed)
Case Management Note  Patient Details  Name: Haleem Semelsberger MRN: [id number] Date of Birth: May 25, 1930  Subjective/Objective: 81 y/o m admitted w/SBO. From home.PT-no f/u.                   Action/Plan:d/c plan home.   Expected Discharge Date:   (UNKNOWN)               Expected Discharge Plan:  Home/Self Care  In-House Referral:     Discharge planning Services  CM Consult  Post Acute Care Choice:    Choice offered to:     DME Arranged:    DME Agency:     HH Arranged:    HH Agency:     Status of Service:  In process, will continue to follow  Medicare Important Message Given:    Date Medicare IM Given:    Medicare IM give by:    Date Additional Medicare IM Given:    Additional Medicare Important Message give by:     If discussed at [place] of Stay Meetings, dates discussed:    Additional Comments:  Dessa Phi, RN 08/15/2015, 2:25 PM

## 2015-08-15 NOTE — Evaluation (Signed)
Physical Therapy Evaluation Patient Details Name: Harold Price MRN: [id number] DOB: August 18, 1930 Today's Date: 08/15/2015   History of Present Illness  81 y.o. male with past medical history of colon cancer, currently undergoing chemotherapy (last infusion 08/06/2015, under Dr. Gearldine Shown care). Patient presented to [facility] hospital with worsening generalized abdominal pain associated with intractable nausea and vomiting over past 24 hours prior to this admission. Imaging showed likely SBO.   Clinical Impression  Pt is independent with mobility, he walked 400' without an assistive device, no loss of balance. No further PT indicated. Encouraged pt to walk in halls TID to prevent deconditioning while in hospital. PT signing off.    Follow Up Recommendations No PT follow up    Equipment Recommendations  None recommended by PT    Recommendations for Other Services       Precautions / Restrictions Precautions Precautions: Other (comment) Precaution Comments: pt denies h/o falls in past 1 year; ostomy Restrictions Weight Bearing Restrictions: No      Mobility  Bed Mobility Overal bed mobility: Independent                Transfers Overall transfer level: Independent                  Ambulation/Gait Ambulation/Gait assistance: Independent Ambulation Distance (Feet): 400 Feet Assistive device: None Gait Pattern/deviations: WFL(Within Functional Limits)   Gait velocity interpretation: at or above normal speed for age/gender General Gait Details: steady, no LOB  Stairs            Wheelchair Mobility    Modified Rankin (Stroke Patients Only)       Balance Overall balance assessment: Independent                                           Pertinent Vitals/Pain Pain Assessment: No/denies pain    Home Living Family/patient expects to be discharged to:: Private residence Living Arrangements: Spouse/significant  other;Children Available Help at Discharge: Family Type of Home: House Home Access: Ramped entrance     Home Layout: One level Home Equipment: None Additional Comments: pt active PTA, still works as a Games developer, able to drive    Prior Function Level of Independence: Independent               Hand Dominance        Extremity/Trunk Assessment   Upper Extremity Assessment: Overall WFL for tasks assessed           Lower Extremity Assessment: Overall WFL for tasks assessed      Cervical / Trunk Assessment: Kyphotic (forward head)  Communication   Communication: No difficulties  Cognition Arousal/Alertness: Awake/alert Behavior During Therapy: WFL for tasks assessed/performed Overall Cognitive Status: Within Functional Limits for tasks assessed                      General Comments      Exercises        Assessment/Plan    PT Assessment Patent does not need any further PT services  PT Diagnosis     PT Problem List    PT Treatment Interventions     PT Goals (Current goals can be found in the Care Plan section) Acute Rehab PT Goals Patient Stated Goal: likes to build ramps and fences PT Goal Formulation: All assessment and education complete, DC therapy    Frequency  Barriers to discharge        Co-evaluation               End of Session Equipment Utilized During Treatment: Gait belt Activity Tolerance: Patient tolerated treatment well Patient left: in chair;with call bell/phone within reach Nurse Communication: Mobility status         Time: [id number] PT Time Calculation (min) (ACUTE ONLY): 16 min   Charges:   PT Evaluation $Initial PT Evaluation Tier I: 1 Procedure     PT G CodesBlondell Reveal Kistler 08/15/2015, 1:24 PM [phone]

## 2015-08-15 NOTE — Progress Notes (Signed)
PROGRESS NOTE  Harold Price [id number] DOB: 1930/01/29 DOA: 08/14/2015 PCP: [name], MD  HPI/Recap of past 24 hours:  Had a large amount of ielostomy output, surgery oked to remove ng, and patient is started on clears today He denies pain, no nausea  Assessment/Plan: Principal Problem:   Pain, abdominal, generalized Active Problems:   Small bowel obstruction (HCC)   Colon cancer ([place])   Anemia due to antineoplastic chemotherapy   Leukopenia due to antineoplastic chemotherapy   Thrombocytopenia (HCC)   Nausea and vomiting in adult  Pain, abdominal, generalized / Small bowel obstruction (HCC) / Nausea and vomiting in adult - Nausea and vomiting likely secondary to small bowel obstruction seen on CT abdomen. Likely in the setting of previous partial colectomy and ileostomy - ng suction, supportive care with IV fluids, antiemetics and analgesia as needed,  - + ileostomy output on 11/30, ng removed, started on clears, appreciate surgery input.  Active Problems:  metastatic Colon cancer ([place]) (stage IIIc with lymphovascular invasion) - Informed pt's oncologist of his admission - On chemotherapy folfox. - last infusion 08/06/2015   Anemia due to antineoplastic chemotherapy / Leukopenia due to antineoplastic chemotherapy /Thrombocytopenia (HCC) - stable on iron supplement, -  Patient does not want blood product due to jehovah's witness   DVT prophylaxis:  - SCD's bilaterally   Code Status: full  Family Communication: patient   Disposition Plan: pending   Consultants:  General surgery  Procedures:  NG suction  Antibiotics:  none   Objective: BP 145/76 mmHg  Pulse 65  Temp(Src) 97.2 F (36.2 C) (Oral)  Resp 18  Ht 6\' 2"  (1.88 m)  Wt 192 lb 7.4 oz (87.3 kg)  BMI 24.70 kg/m2  SpO2 100%  Intake/Output Summary (Last 24 hours) at 08/15/15 1029 Last data filed at 08/15/15 0330  Gross per 24 hour  Intake    660 ml  Output   1150 ml  Net    -490 ml   Filed Weights   08/14/15 1700 08/15/15 0530  Weight: 190 lb 11.2 oz (86.5 kg) 192 lb 7.4 oz (87.3 kg)    Exam:   General:  NAD  Cardiovascular: RRR  Respiratory: CTABL  Abdomen: Soft/ND/NT, positive BS, well healed surgical scar, ileostomy with loose fecal matter  Musculoskeletal: No Edema  Neuro: aaox3, no focal deficit  Data Reviewed: Basic Metabolic Panel:  Recent Labs Lab 08/10/15 1353 08/14/15 0926 08/15/15 0755  NA 135 135 135  K 3.2* 3.7 3.6  CL 99* 100* 103  CO2  --  30 28  GLUCOSE 108* 110* 98  BUN 9 7 7   CREATININE 0.80 0.79 0.89  CALCIUM  --  9.8 9.0   Liver Function Tests:  Recent Labs Lab 08/14/15 0926  AST 17  ALT 8*  ALKPHOS 39  BILITOT 0.8  PROT 6.3*  ALBUMIN 3.7    Recent Labs Lab 08/14/15 0926  LIPASE 17   No results for input(s): AMMONIA in the last 168 hours. CBC:  Recent Labs Lab 08/10/15 1333 08/10/15 1353 08/14/15 0926 08/15/15 0755  WBC 2.8*  --  3.9* 4.9  NEUTROABS 1.6*  --  2.3  --   HGB 9.8* 11.2* 10.2* 10.2*  HCT 30.4* 33.0* 31.9* 32.2*  MCV 75.2*  --  75.6* 74.7*  PLT 69*  --  103* 103*   Cardiac Enzymes:   No results for input(s): CKTOTAL, CKMB, CKMBINDEX, TROPONINI in the last 168 hours. BNP (last 3 results)  Recent Labs  08/10/15 1333  BNP 65.5    ProBNP (last 3 results) No results for input(s): PROBNP in the last 8760 hours.  CBG: No results for input(s): GLUCAP in the last 168 hours.  Recent Results (from the past 240 hour(s))  Urine culture     Status: None   Collection Time: 08/06/15  5:00 PM  Result Value Ref Range Status   Specimen Description URINE, RANDOM  Final   Special Requests NONE  Final   Culture   Final    MULTIPLE SPECIES PRESENT, SUGGEST RECOLLECTION Performed at [facility]    Report Status 08/08/2015 FINAL  Final     Studies: Ct Abdomen Pelvis W Contrast  08/14/2015  CLINICAL DATA:  Mid abdominal pain. History of colon cancer status post right  hemicolectomy. EXAM: CT ABDOMEN AND PELVIS WITH CONTRAST TECHNIQUE: Multidetector CT imaging of the abdomen and pelvis was performed using the standard protocol following bolus administration of intravenous contrast. CONTRAST:  181mL OMNIPAQUE IOHEXOL 300 MG/ML  SOLN COMPARISON:  CT 04/28/2015.  MRI 06/01/2015. FINDINGS: Lower chest: Interval improved aeration of the lung bases with mild residual atelectasis or scarring. No significant pleural effusion. Hepatobiliary: There is progressive multifocal hepatic metastatic disease. Index lesion in the dome of the right hepatic lobe measures 2.9 x 2.7 cm on image 12 (2.8 x 2.4 cm on MRI). Ill-defined lesion superior to the gallbladder measures 2.5 x 2.1 cm on image 23. There is a stable simple cyst measuring 2.6 cm in the medial segment of the left lobe on image number 21. No evidence of gallstones, gallbladder wall thickening or biliary dilatation. Pancreas: Stable mild pancreatic atrophy. No focal mass lesion or significant ductal dilatation. Spleen: Normal in size without focal abnormality. Adrenals/Urinary Tract: Both adrenal glands appear normal. The kidneys appear unchanged. No evidence of renal mass or hydronephrosis. The bladder appears normal. Stomach/Bowel: Small hiatal hernia. Right lower quadrant ileostomy noted status post subtotal colectomy. There is mild diffuse dilatation of the small bowel with air-fluid levels. Small bowel feces sign noted in the right mid abdomen. There is no significant bowel wall thickening. Vascular/Lymphatic: There are no enlarged abdominal or pelvic lymph nodes. Small lymph nodes in the porta hepatis are stable, not pathologically enlarged. Stable mild aortoiliac atherosclerosis. Reproductive: Stable mild enlargement of the prostate gland. Other: Ascites has decreased in volume compared with the prior CT. A small amount of ascites remains, primarily in the right upper quadrant. No peritoneal nodularity identified. There is mild  mesenteric edema. Musculoskeletal: No acute or significant osseous findings. Partially bridging osteophytes are again noted within the lumbar spine and sacroiliac joints. IMPRESSION: 1. Progressive multifocal hepatic metastatic disease. 2. No definite extrahepatic tumor identified. Ascites has improved from 3 months ago, without peritoneal nodularity. 3. Diffuse small bowel distension status post partial colectomy and ileostomy, worrisome for partial small bowel obstruction. No clear demonstrated etiology. 4. These results will be called to the ordering clinician or representative by the Radiologist Assistant, and communication documented in the PACS or zVision Dashboard. Electronically Signed   By: Richardean Sale M.D.   On: 08/14/2015 11:52    Scheduled Meds: . ferrous sulfate  325 mg Oral BID WC    Continuous Infusions: . sodium chloride 75 mL/hr at 08/15/15 0116     Time spent: 23mins  Visente Kirker MD, PhD  [facility] Pager [phone]. If 7PM-7AM, please contact night-coverage at [email or web address], password [facility] 08/15/2015, 10:29 AM  LOS: 1 day

## 2015-08-16 ENCOUNTER — Other Ambulatory Visit: Payer: Self-pay | Admitting: *Deleted

## 2015-08-16 DIAGNOSIS — C787 Secondary malignant neoplasm of liver and intrahepatic bile duct: Secondary | ICD-10-CM

## 2015-08-16 LAB — BASIC METABOLIC PANEL
ANION GAP: 4 — AB (ref 5–15)
BUN: 8 mg/dL (ref 6–20)
CALCIUM: 8.7 mg/dL — AB (ref 8.9–10.3)
CHLORIDE: 103 mmol/L (ref 101–111)
CO2: 27 mmol/L (ref 22–32)
Creatinine, Ser: 0.89 mg/dL (ref 0.61–1.24)
GFR calc non Af Amer: >60 mL/min (ref >60)
Glucose, Bld: 129 mg/dL — ABNORMAL HIGH (ref 65–99)
Potassium: 3.5 mmol/L (ref 3.5–5.1)
Sodium: 134 mmol/L — ABNORMAL LOW (ref 135–145)

## 2015-08-16 LAB — CBC
HEMATOCRIT: 31.8 % — AB (ref 39.0–52.0)
HEMOGLOBIN: 10.2 g/dL — AB (ref 13.0–17.0)
MCH: 24.5 pg — ABNORMAL LOW (ref 26.0–34.0)
MCHC: 32.1 g/dL (ref 30.0–36.0)
MCV: 76.3 fL — ABNORMAL LOW (ref 78.0–100.0)
Platelets: 94 10*3/uL — ABNORMAL LOW (ref 150–400)
RBC: 4.17 MIL/uL — ABNORMAL LOW (ref 4.22–5.81)
RDW: 17.3 % — AB (ref 11.5–15.5)
WBC: 3 10*3/uL — AB (ref 4.0–10.5)

## 2015-08-16 LAB — GLUCOSE, CAPILLARY: GLUCOSE-CAPILLARY: 134 mg/dL — AB (ref 65–99)

## 2015-08-16 LAB — MAGNESIUM: MAGNESIUM: 2 mg/dL (ref 1.7–2.4)

## 2015-08-16 LAB — TSH: TSH: 2.105 u[IU]/mL (ref 0.350–4.500)

## 2015-08-16 MED ORDER — SODIUM CHLORIDE 0.9 % IJ SOLN
10.0000 mL | INTRAMUSCULAR | Status: DC | PRN
Start: 2015-08-16 — End: 2015-08-16
  Administered 2015-08-16: 10 mL

## 2015-08-16 MED ORDER — HEPARIN SOD (PORK) LOCK FLUSH 100 UNIT/ML IV SOLN
500.0000 [IU] | INTRAVENOUS | Status: AC | PRN
  Administered 2015-08-16: 500 [IU]

## 2015-08-16 MED ORDER — BOOST / RESOURCE BREEZE PO LIQD: 1.0000 | Freq: Three times a day (TID) | ORAL | Status: AC

## 2015-08-16 NOTE — Discharge Summary (Signed)
Discharge Summary  Harold Price [id number] DOB: 1930-07-25  PCP: [name], MD  Admit date: 08/14/2015 Discharge date: 08/16/2015  Time spent: <74mins  Recommendations for Outpatient Follow-up:  1. F/u with oncology Dr. Benay Spice in one week  Discharge Diagnoses:  Active Hospital Problems   Diagnosis Date Noted  . Pain, abdominal, generalized 08/14/2015  . Anemia due to antineoplastic chemotherapy 08/14/2015  . Leukopenia due to antineoplastic chemotherapy 08/14/2015  . Thrombocytopenia ([place]) 08/14/2015  . Nausea and vomiting in adult 08/14/2015  . Colon cancer ([place]) 05/08/2015  . Small bowel obstruction ([place]) 04/20/2015    Resolved Hospital Problems   Diagnosis Date Noted Date Resolved  No resolved problems to display.    Discharge Condition: stable  Diet recommendation: heart healthy  Filed Weights   08/14/15 1700 08/15/15 0530 08/16/15 0658  Weight: 190 lb 11.2 oz (86.5 kg) 192 lb 7.4 oz (87.3 kg) 186 lb 3.2 oz (84.46 kg)    History of present illness:  81 y.o. male with past medical history of colon cancer, currently undergoing chemotherapy (last infusion 08/06/2015, under Dr. Gearldine Shown care). Patient presented to [facility] long hospital with worsening generalized abdominal pain associated with intractable nausea and vomiting over past 24 hours prior to this admission. No hematemesis. Patient reports abdominal pain as diffuse, inconsistently sharp and dull, intermittent, present at the rest, about 6 out of 10 in intensity. Patient reported abdominal pain better with analgesia given in ED. No associated fevers. No diarrhea or constipation. No blood in the stool or urine. No fevers. No chest pain or shortness of breath or palpitations.  In ED, patient was hemodynamically stable. Blood work demonstrated white blood cell count of 3.9, hemoglobin 10.2, platelets 103, normal renal function, normal lactic acid. CT abdomen demonstrated progressive multifocal hepatic  metastatic disease, diffuse small bowel distention status post partial colectomy and ileostomy worrisome for partial small bowel obstruction. Surgery was consulted by ED physician.   Hospital Course:  Principal Problem:   Pain, abdominal, generalized Active Problems:   Small bowel obstruction (HCC)   Colon cancer (HCC)   Anemia due to antineoplastic chemotherapy   Leukopenia due to antineoplastic chemotherapy   Thrombocytopenia (HCC)   Nausea and vomiting in adult  Pain, abdominal, generalized / Small bowel obstruction (HCC) / Nausea and vomiting in adult - Nausea and vomiting likely secondary to small bowel obstruction seen on CT abdomen. Likely in the setting of previous partial colectomy and ileostomy - ng suction, supportive care with IV fluids, antiemetics and analgesia as needed,  - + ileostomy output on 11/30, ng removed, started on clears, appreciate surgery input. -12/1 continue to improved, tolerating diet advancement, cleared by surgery to be discharged home  Active Problems:  metastatic Colon cancer ([place]) (stage IIIc with lymphovascular invasion) - Informed pt's oncologist of his admission - On chemotherapy folfox. - last infusion 08/06/2015   Anemia due to antineoplastic chemotherapy / Leukopenia due to antineoplastic chemotherapy /Thrombocytopenia (HCC) - stable on iron supplement, - Patient does not want blood product due to jehovah's witness    Code Status: full  Family Communication: patient   Disposition Plan: home   Consultants:  General surgery  oncology  Procedures:  NG suction  Antibiotics:  none   Discharge Exam: BP 119/46 mmHg  Pulse 67  Temp(Src) 98.4 F (36.9 C) (Oral)  Resp 20  Ht 6\' 2"  (1.88 m)  Wt 186 lb 3.2 oz (84.46 kg)  BMI 23.90 kg/m2  SpO2 100%   General: NAD  Cardiovascular: RRR  Respiratory: CTABL  Abdomen: Soft/ND/NT, positive BS, well healed surgical scar, ileostomy with loose fecal  matter  Musculoskeletal: No Edema  Neuro: aaox3, no focal deficit    Discharge Instructions You were cared for by a hospitalist during your hospital stay. If you have any questions about your discharge medications or the care you received while you were in the hospital after you are discharged, you can call the unit and asked to speak with the hospitalist on call if the hospitalist that took care of you is not available. Once you are discharged, your primary care physician will handle any further medical issues. Please note that NO REFILLS for any discharge medications will be authorized once you are discharged, as it is imperative that you return to your primary care physician (or establish a relationship with a primary care physician if you do not have one) for your aftercare needs so that they can reassess your need for medications and monitor your lab values.      Discharge Instructions    Diet - low sodium heart healthy    Complete by:  As directed      Increase activity slowly    Complete by:  As directed             Medication List    TAKE these medications        acetaminophen 500 MG tablet  Commonly known as:  TYLENOL  Take 500 mg by mouth every 4 (four) hours as needed for mild pain, moderate pain, fever or headache.     calcium carbonate 500 MG chewable tablet  Commonly known as:  TUMS - dosed in mg elemental calcium  Chew 1 tablet by mouth 2 (two) times daily as needed for indigestion or heartburn.     diphenoxylate-atropine 2.5-0.025 MG tablet  Commonly known as:  LOMOTIL  Take 2 tablets by mouth 4 (four) times daily as needed for diarrhea or loose stools.     feeding supplement Liqd  Take 1 Container by mouth 3 (three) times daily between meals.     ferrous sulfate 325 (65 FE) MG tablet  Take 325 mg by mouth 2 (two) times daily with a meal.     HYDROcodone-acetaminophen 5-325 MG tablet  Commonly known as:  NORCO  Take 1 tablet by mouth every 6 (six) hours  as needed for moderate pain.     lidocaine-prilocaine cream  Commonly known as:  EMLA  Apply 1 application topically as needed. Apply 1 tablespoon to [facility] site and cover with plastic wrap 1 hour prior to chemo     loperamide 2 MG capsule  Commonly known as:  IMODIUM  Take 2 mg by mouth 3 (three) times daily.     prochlorperazine 10 MG tablet  Commonly known as:  COMPAZINE  Take 1 tablet (10 mg total) by mouth every 6 (six) hours as needed for nausea.       No Known Allergies    The results of significant diagnostics from this hospitalization (including imaging, microbiology, ancillary and laboratory) are listed below for reference.    Significant Diagnostic Studies: Dg Chest 2 View  08/10/2015  CLINICAL DATA:  Short of breath EXAM: CHEST  2 VIEW COMPARISON:  06/20/2015 FINDINGS: Heart size and vascularity normal. Negative for heart failure. Lungs are clear without infiltrate or effusion. Port-A-Cath tip in the SVC is unchanged. IMPRESSION: No active cardiopulmonary disease. Electronically Signed   By: Franchot Gallo M.D.   On: 08/10/2015 11:33   Ct  Abdomen Pelvis W Contrast  08/14/2015  CLINICAL DATA:  Mid abdominal pain. History of colon cancer status post right hemicolectomy. EXAM: CT ABDOMEN AND PELVIS WITH CONTRAST TECHNIQUE: Multidetector CT imaging of the abdomen and pelvis was performed using the standard protocol following bolus administration of intravenous contrast. CONTRAST:  165mL OMNIPAQUE IOHEXOL 300 MG/ML  SOLN COMPARISON:  CT 04/28/2015.  MRI 06/01/2015. FINDINGS: Lower chest: Interval improved aeration of the lung bases with mild residual atelectasis or scarring. No significant pleural effusion. Hepatobiliary: There is progressive multifocal hepatic metastatic disease. Index lesion in the dome of the right hepatic lobe measures 2.9 x 2.7 cm on image 12 (2.8 x 2.4 cm on MRI). Ill-defined lesion superior to the gallbladder measures 2.5 x 2.1 cm on image 23. There is a  stable simple cyst measuring 2.6 cm in the medial segment of the left lobe on image number 21. No evidence of gallstones, gallbladder wall thickening or biliary dilatation. Pancreas: Stable mild pancreatic atrophy. No focal mass lesion or significant ductal dilatation. Spleen: Normal in size without focal abnormality. Adrenals/Urinary Tract: Both adrenal glands appear normal. The kidneys appear unchanged. No evidence of renal mass or hydronephrosis. The bladder appears normal. Stomach/Bowel: Small hiatal hernia. Right lower quadrant ileostomy noted status post subtotal colectomy. There is mild diffuse dilatation of the small bowel with air-fluid levels. Small bowel feces sign noted in the right mid abdomen. There is no significant bowel wall thickening. Vascular/Lymphatic: There are no enlarged abdominal or pelvic lymph nodes. Small lymph nodes in the porta hepatis are stable, not pathologically enlarged. Stable mild aortoiliac atherosclerosis. Reproductive: Stable mild enlargement of the prostate gland. Other: Ascites has decreased in volume compared with the prior CT. A small amount of ascites remains, primarily in the right upper quadrant. No peritoneal nodularity identified. There is mild mesenteric edema. Musculoskeletal: No acute or significant osseous findings. Partially bridging osteophytes are again noted within the lumbar spine and sacroiliac joints. IMPRESSION: 1. Progressive multifocal hepatic metastatic disease. 2. No definite extrahepatic tumor identified. Ascites has improved from 3 months ago, without peritoneal nodularity. 3. Diffuse small bowel distension status post partial colectomy and ileostomy, worrisome for partial small bowel obstruction. No clear demonstrated etiology. 4. These results will be called to the ordering clinician or representative by the Radiologist Assistant, and communication documented in the PACS or zVision Dashboard. Electronically Signed   By: Richardean Sale M.D.   On:  08/14/2015 11:52    Microbiology: Recent Results (from the past 240 hour(s))  Urine culture     Status: None   Collection Time: 08/06/15  5:00 PM  Result Value Ref Range Status   Specimen Description URINE, RANDOM  Final   Special Requests NONE  Final   Culture   Final    MULTIPLE SPECIES PRESENT, SUGGEST RECOLLECTION Performed at [facility]    Report Status 08/08/2015 FINAL  Final     Labs: Basic Metabolic Panel:  Recent Labs Lab 08/10/15 1353 08/14/15 0926 08/15/15 0755 08/16/15 0214  NA 135 135 135 134*  K 3.2* 3.7 3.6 3.5  CL 99* 100* 103 103  CO2  --  30 28 27   GLUCOSE 108* 110* 98 129*  BUN 9 7 7 8   CREATININE 0.80 0.79 0.89 0.89  CALCIUM  --  9.8 9.0 8.7*  MG  --   --  2.1 2.0   Liver Function Tests:  Recent Labs Lab 08/14/15 0926  AST 17  ALT 8*  ALKPHOS 39  BILITOT 0.8  PROT 6.3*  ALBUMIN 3.7    Recent Labs Lab 08/14/15 0926  LIPASE 17   No results for input(s): AMMONIA in the last 168 hours. CBC:  Recent Labs Lab 08/10/15 1333 08/10/15 1353 08/14/15 0926 08/15/15 0755 08/16/15 0214  WBC 2.8*  --  3.9* 4.9 3.0*  NEUTROABS 1.6*  --  2.3  --   --   HGB 9.8* 11.2* 10.2* 10.2* 10.2*  HCT 30.4* 33.0* 31.9* 32.2* 31.8*  MCV 75.2*  --  75.6* 74.7* 76.3*  PLT 69*  --  103* 103* 94*   Cardiac Enzymes: No results for input(s): CKTOTAL, CKMB, CKMBINDEX, TROPONINI in the last 168 hours. BNP: BNP (last 3 results)  Recent Labs  08/10/15 1333  BNP 65.5    ProBNP (last 3 results) No results for input(s): PROBNP in the last 8760 hours.  CBG:  Recent Labs Lab 08/16/15 0800  GLUCAP 134*       Signed:  Nadalie Laughner MD, PhD  [facility] 08/16/2015, 11:13 AM

## 2015-08-16 NOTE — Progress Notes (Signed)
Patient ID: Harold Price, male   DOB: 02-20-1930, 81 y.o.   MRN: [id number]    Subjective: Pt feels well this morning.  Wanting solid diet.  Objective: Vital signs in last 24 hours: Temp:  [98.4 F (36.9 C)-99 F (37.2 C)] 98.4 F (36.9 C) (11/30 2032) Pulse Rate:  [57-67] 67 (11/30 2032) Resp:  [20] 20 (11/30 2032) BP: (119-131)/(46-54) 119/46 mmHg (11/30 2032) SpO2:  [100 %] 100 % (11/30 2032) Weight:  [84.46 kg (186 lb 3.2 oz)] 84.46 kg (186 lb 3.2 oz) (12/01 0658) Last BM Date: 08/15/15  Intake/Output from previous day: 11/30 0701 - 12/01 0700 In: 2862.5 [P.O.:480; I.V.:2382.5] Out: 1050 [Urine:300; Drains:250; Stool:500] Intake/Output this shift:    PE: Abd: soft, Nt, ND, ostomy bag full of air and enteric contents.  Lab Results:   Recent Labs  08/15/15 0755 08/16/15 0214  WBC 4.9 3.0*  HGB 10.2* 10.2*  HCT 32.2* 31.8*  PLT 103* 94*   BMET  Recent Labs  08/15/15 0755 08/16/15 0214  NA 135 134*  K 3.6 3.5  CL 103 103  CO2 28 27  GLUCOSE 98 129*  BUN 7 8  CREATININE 0.89 0.89  CALCIUM 9.0 8.7*   PT/INR No results for input(s): LABPROT, INR in the last 72 hours. CMP     Component Value Date/Time   NA 134* 08/16/2015 0214   NA 138 08/06/2015 1139   K 3.5 08/16/2015 0214   K 3.6 08/06/2015 1139   CL 103 08/16/2015 0214   CO2 27 08/16/2015 0214   CO2 23 08/06/2015 1139   GLUCOSE 129* 08/16/2015 0214   GLUCOSE 117 08/06/2015 1139   BUN 8 08/16/2015 0214   BUN 12.3 08/06/2015 1139   CREATININE 0.89 08/16/2015 0214   CREATININE 0.9 08/06/2015 1139   CALCIUM 8.7* 08/16/2015 0214   CALCIUM 9.2 08/06/2015 1139   PROT 6.3* 08/14/2015 0926   PROT 5.7* 08/06/2015 1139   ALBUMIN 3.7 08/14/2015 0926   ALBUMIN 3.3* 08/06/2015 1139   AST 17 08/14/2015 0926   AST 16 08/06/2015 1139   ALT 8* 08/14/2015 0926   ALT <9 08/06/2015 1139   ALKPHOS 39 08/14/2015 0926   ALKPHOS 50 08/06/2015 1139   BILITOT 0.8 08/14/2015 0926   BILITOT 0.41 08/06/2015 1139    GFRNONAA >60 08/16/2015 0214   GFRAA >60 08/16/2015 0214   Lipase     Component Value Date/Time   LIPASE 17 08/14/2015 0926       Studies/Results: Ct Abdomen Pelvis W Contrast  08/14/2015  CLINICAL DATA:  Mid abdominal pain. History of colon cancer status post right hemicolectomy. EXAM: CT ABDOMEN AND PELVIS WITH CONTRAST TECHNIQUE: Multidetector CT imaging of the abdomen and pelvis was performed using the standard protocol following bolus administration of intravenous contrast. CONTRAST:  130mL OMNIPAQUE IOHEXOL 300 MG/ML  SOLN COMPARISON:  CT 04/28/2015.  MRI 06/01/2015. FINDINGS: Lower chest: Interval improved aeration of the lung bases with mild residual atelectasis or scarring. No significant pleural effusion. Hepatobiliary: There is progressive multifocal hepatic metastatic disease. Index lesion in the dome of the right hepatic lobe measures 2.9 x 2.7 cm on image 12 (2.8 x 2.4 cm on MRI). Ill-defined lesion superior to the gallbladder measures 2.5 x 2.1 cm on image 23. There is a stable simple cyst measuring 2.6 cm in the medial segment of the left lobe on image number 21. No evidence of gallstones, gallbladder wall thickening or biliary dilatation. Pancreas: Stable mild pancreatic atrophy. No focal mass lesion or  significant ductal dilatation. Spleen: Normal in size without focal abnormality. Adrenals/Urinary Tract: Both adrenal glands appear normal. The kidneys appear unchanged. No evidence of renal mass or hydronephrosis. The bladder appears normal. Stomach/Bowel: Small hiatal hernia. Right lower quadrant ileostomy noted status post subtotal colectomy. There is mild diffuse dilatation of the small bowel with air-fluid levels. Small bowel feces sign noted in the right mid abdomen. There is no significant bowel wall thickening. Vascular/Lymphatic: There are no enlarged abdominal or pelvic lymph nodes. Small lymph nodes in the porta hepatis are stable, not pathologically enlarged. Stable mild  aortoiliac atherosclerosis. Reproductive: Stable mild enlargement of the prostate gland. Other: Ascites has decreased in volume compared with the prior CT. A small amount of ascites remains, primarily in the right upper quadrant. No peritoneal nodularity identified. There is mild mesenteric edema. Musculoskeletal: No acute or significant osseous findings. Partially bridging osteophytes are again noted within the lumbar spine and sacroiliac joints. IMPRESSION: 1. Progressive multifocal hepatic metastatic disease. 2. No definite extrahepatic tumor identified. Ascites has improved from 3 months ago, without peritoneal nodularity. 3. Diffuse small bowel distension status post partial colectomy and ileostomy, worrisome for partial small bowel obstruction. No clear demonstrated etiology. 4. These results will be called to the ordering clinician or representative by the Radiologist Assistant, and communication documented in the PACS or zVision Dashboard. Electronically Signed   By: Richardean Sale M.D.   On: 08/14/2015 11:52    Anti-infectives: Anti-infectives    None       Assessment/Plan  1. Abdominal pain, ? Psbo, resolved -the patient very likely had a small food bezoar from too much fiber on Tuesday.  I have educated the patient that fiber is good for him with an ileostomy, but to try to avoid eating so much fiber in one sitting.  He understands. -advance to solid diet. -patient is surgically stable for dc home if he tolerates his diet -no surgical issues, we will sign off.   LOS: 2 days    Ashlynne Shetterly E 08/16/2015, 9:06 AM Pager: [phone]

## 2015-08-17 ENCOUNTER — Other Ambulatory Visit: Payer: Medicare Other

## 2015-08-20 ENCOUNTER — Other Ambulatory Visit (HOSPITAL_BASED_OUTPATIENT_CLINIC_OR_DEPARTMENT_OTHER): Payer: Medicare Other

## 2015-08-20 ENCOUNTER — Ambulatory Visit (HOSPITAL_BASED_OUTPATIENT_CLINIC_OR_DEPARTMENT_OTHER): Payer: Medicare Other | Admitting: Oncology

## 2015-08-20 ENCOUNTER — Other Ambulatory Visit: Payer: Medicare Other

## 2015-08-20 ENCOUNTER — Ambulatory Visit: Payer: Medicare Other | Admitting: Nurse Practitioner

## 2015-08-20 ENCOUNTER — Ambulatory Visit (HOSPITAL_BASED_OUTPATIENT_CLINIC_OR_DEPARTMENT_OTHER): Payer: Medicare Other

## 2015-08-20 ENCOUNTER — Encounter: Payer: Self-pay | Admitting: *Deleted

## 2015-08-20 VITALS — BP 136/56 | HR 71 | Temp 98.0°F | Resp 18 | Ht 74.0 in | Wt 189.9 lb

## 2015-08-20 DIAGNOSIS — C189 Malignant neoplasm of colon, unspecified: Secondary | ICD-10-CM

## 2015-08-20 DIAGNOSIS — K769 Liver disease, unspecified: Secondary | ICD-10-CM | POA: Diagnosis not present

## 2015-08-20 DIAGNOSIS — R222 Localized swelling, mass and lump, trunk: Secondary | ICD-10-CM

## 2015-08-20 DIAGNOSIS — C180 Malignant neoplasm of cecum: Secondary | ICD-10-CM | POA: Diagnosis present

## 2015-08-20 DIAGNOSIS — Z5111 Encounter for antineoplastic chemotherapy: Secondary | ICD-10-CM

## 2015-08-20 DIAGNOSIS — D509 Iron deficiency anemia, unspecified: Secondary | ICD-10-CM | POA: Diagnosis not present

## 2015-08-20 LAB — COMPREHENSIVE METABOLIC PANEL
ALBUMIN: 3.7 g/dL (ref 3.5–5.0)
ALK PHOS: 51 U/L (ref 40–150)
ANION GAP: 7 meq/L (ref 3–11)
AST: 18 U/L (ref 5–34)
BILIRUBIN TOTAL: 0.51 mg/dL (ref 0.20–1.20)
BUN: 10.5 mg/dL (ref 7.0–26.0)
CALCIUM: 10 mg/dL (ref 8.4–10.4)
CO2: 25 meq/L (ref 22–29)
CREATININE: 1.1 mg/dL (ref 0.7–1.3)
Chloride: 105 mEq/L (ref 98–109)
EGFR: 72 mL/min/{1.73_m2} — AB (ref >90)
Glucose: 96 mg/dl (ref 70–140)
Potassium: 4.5 mEq/L (ref 3.5–5.1)
Sodium: 137 mEq/L (ref 136–145)
TOTAL PROTEIN: 6.7 g/dL (ref 6.4–8.3)

## 2015-08-20 LAB — CBC WITH DIFFERENTIAL/PLATELET
BASO%: 0.8 % (ref 0.0–2.0)
Basophils Absolute: 0 10*3/uL (ref 0.0–0.1)
EOS%: 4 % (ref 0.0–7.0)
Eosinophils Absolute: 0.2 10*3/uL (ref 0.0–0.5)
HCT: 33.3 % — ABNORMAL LOW (ref 38.4–49.9)
HEMOGLOBIN: 10.7 g/dL — AB (ref 13.0–17.1)
LYMPH%: 27 % (ref 14.0–49.0)
MCH: 24.3 pg — ABNORMAL LOW (ref 27.2–33.4)
MCHC: 32.1 g/dL (ref 32.0–36.0)
MCV: 75.6 fL — AB (ref 79.3–98.0)
MONO#: 0.5 10*3/uL (ref 0.1–0.9)
MONO%: 12.2 % (ref 0.0–14.0)
NEUT%: 56 % (ref 39.0–75.0)
NEUTROS ABS: 2.4 10*3/uL (ref 1.5–6.5)
PLATELETS: 131 10*3/uL — AB (ref 140–400)
RBC: 4.4 10*6/uL (ref 4.20–5.82)
RDW: 18.7 % — AB (ref 11.0–14.6)
WBC: 4.2 10*3/uL (ref 4.0–10.3)
lymph#: 1.1 10*3/uL (ref 0.9–3.3)

## 2015-08-20 MED ORDER — FLUOROURACIL CHEMO INJECTION 5 GM/100ML
1800.0000 mg/m2 | INTRAVENOUS | Status: DC
  Administered 2015-08-20: 3900 mg via INTRAVENOUS
  Filled 2015-08-20: qty 78

## 2015-08-20 MED ORDER — SODIUM CHLORIDE 0.9 % IV SOLN
Freq: Once | INTRAVENOUS | Status: AC
  Administered 2015-08-20: 12:00:00 via INTRAVENOUS
  Filled 2015-08-20: qty 4

## 2015-08-20 MED ORDER — OXALIPLATIN CHEMO INJECTION 100 MG/20ML
65.0000 mg/m2 | Freq: Once | INTRAVENOUS | Status: AC
  Administered 2015-08-20: 140 mg via INTRAVENOUS
  Filled 2015-08-20: qty 28

## 2015-08-20 MED ORDER — FLUOROURACIL CHEMO INJECTION 2.5 GM/50ML
300.0000 mg/m2 | Freq: Once | INTRAVENOUS | Status: AC
  Administered 2015-08-20: 650 mg via INTRAVENOUS
  Filled 2015-08-20: qty 13

## 2015-08-20 MED ORDER — DEXTROSE 5 % IV SOLN
Freq: Once | INTRAVENOUS | Status: AC
  Administered 2015-08-20: 12:00:00 via INTRAVENOUS

## 2015-08-20 MED ORDER — LEUCOVORIN CALCIUM INJECTION 350 MG
300.0000 mg/m2 | Freq: Once | INTRAVENOUS | Status: AC
  Administered 2015-08-20: 648 mg via INTRAVENOUS
  Filled 2015-08-20: qty 32.4

## 2015-08-20 NOTE — Patient Instructions (Signed)
[facility] Discharge Instructions for Patients Receiving Chemotherapy  Today you received the following chemotherapy agents Oxaliplatin/Leucovorin/5 FU  To help prevent nausea and vomiting after your treatment, we encourage you to take your nausea medication as prescribed.   If you develop nausea and vomiting that is not controlled by your nausea medication, call the clinic.   BELOW ARE SYMPTOMS THAT SHOULD BE REPORTED IMMEDIATELY:  *FEVER GREATER THAN 100.5 F  *CHILLS WITH OR WITHOUT FEVER  NAUSEA AND VOMITING THAT IS NOT CONTROLLED WITH YOUR NAUSEA MEDICATION  *UNUSUAL SHORTNESS OF BREATH  *UNUSUAL BRUISING OR BLEEDING  TENDERNESS IN MOUTH AND THROAT WITH OR WITHOUT PRESENCE OF ULCERS  *URINARY PROBLEMS  *BOWEL PROBLEMS  UNUSUAL RASH Items with * indicate a potential emergency and should be followed up as soon as possible.  Feel free to call the clinic you have any questions or concerns. The clinic phone number is [phone].  Please show the CHEMO ALERT CARD at check-in to the Emergency Department and triage nurse.   

## 2015-08-20 NOTE — Progress Notes (Signed)
Oncology Nurse Navigator Documentation  Oncology Nurse Navigator Flowsheets 08/20/2015  Referral date to RadOnc/MedOnc -  Navigator Encounter Type Treatment  Patient Visit Type Medonc  Treatment Phase Treatment #6-FOLFOX  Barriers/Navigation Needs Family concerns;Education  Education Symptom Management--diarrhea  Interventions Education Method  Referrals -  Education Method Verbal;Written;Teach-back  Support Groups/Services -  Specialty Items/DME -  Time Spent with Patient 30  Per Dr. Benay Spice : Instructed to take #2 Lomotil twice daily and #1 Imodium twice daily. Recent admission with SBO. Patient does not call office when he is having too many stools, so navigator will start calling him every 48 hours to follow up and make changes as needed.  Came into office today looking disheveled-sweater was dirty with stains and torn. He reports that this stepdaughter helps in home with cooking and laundry.

## 2015-08-20 NOTE — Progress Notes (Signed)
  [facility] OFFICE PROGRESS NOTE   Diagnosis: Colon cancer  INTERVAL HISTORY:   Mr. Girouard was discharged on 08/16/2015 after an admission with a partial small bowel obstruction versus ileus. No recurrent nausea or vomiting. He empties the ileostomy approximately 5 times per day. He has decreased the use of Imodium and Lomotil. He has cold sensitivity in the fingers. This does not interfere with activity.  Objective:  Vital signs in last 24 hours:  Blood pressure 136/56, pulse 71, temperature 98 F (36.7 C), temperature source Oral, resp. rate 18, height $RemoveBe'6\' 2"'kazimCsNd$  (1.88 m), weight 189 lb 14.4 oz (86.138 kg), SpO2 100 %.    HEENT: No thrush or ulcers, the mucus membranes are moist Resp: Lungs clear bilaterally Cardio: Regular rate and rhythm GI: No hepatomegaly, right lower quadrant ileostomy with liquid stool Vascular: No leg edema   Portacath/PICC-without erythema  Lab Results:  Lab Results  Component Value Date   WBC 4.2 08/20/2015   HGB 10.7* 08/20/2015   HCT 33.3* 08/20/2015   MCV 75.6* 08/20/2015   PLT 131* 08/20/2015   NEUTROABS 2.4 08/20/2015      Lab Results  Component Value Date   CEA 3.1 06/26/2015    Imaging:  No results found.  Medications: I have reviewed the patient's current medications.  Assessment/Plan: 1. 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  Cycle 5 FOLFOX 08/20/2015 (oxaliplatin dose reduced)   2. Bleeding duodenal ulcer February 2014  3. History of heavy alcohol use  4. Enterobacter bacteremia following surgery August 2016  5. Microcytic anemia secondary to #1, taking ferrous sulfate  6. CT chest 05/15/2015-negative for metastatic disease, fatty mass at the left chest wall-low-grade neoplasm not excluded  7. Port-A-Cath placement 06/20/2015  8. Admission with a partial small bowel dysfunction versus ileus 08/14/2015-improved with NG tube decompression  9. Thrombocytopenia secondary to chemotherapy-resolved     Disposition:  Mr. Salminen appears well today. He was admitted following the last cycle of chemotherapy with an ileus versus partial small bowel obstruction. His symptoms improved with one day of NG tube decompression. He now has high-volume liquid stool output again. He will resume Imodium/Lomotil. We decided to proceed with another cycle of FOLFOX today. I reviewed the recent abdomen CT. There has been minimal change in the size of liver lesions compared to the MRI from 06/01/2015 and there was a one month interval between the baseline MRI and initiation of systemic therapy.  We dose reduced the oxaliplatin secondary to thrombocytopenia Following the Most Recent Cycle of Chemotherapy.  Mr. Lemming will return for an office and lab visit in 2 weeks. The next cycle of chemotherapy will be scheduled at a 3 week interval. The plan is to complete 3 more cycles of chemotherapy prior to a restaging evaluation.  Betsy Coder, MD  08/20/2015  2:18 PM

## 2015-08-22 ENCOUNTER — Ambulatory Visit (HOSPITAL_BASED_OUTPATIENT_CLINIC_OR_DEPARTMENT_OTHER): Payer: Medicare Other

## 2015-08-22 VITALS — BP 118/49 | HR 80 | Temp 97.2°F

## 2015-08-22 DIAGNOSIS — C180 Malignant neoplasm of cecum: Secondary | ICD-10-CM

## 2015-08-22 DIAGNOSIS — C189 Malignant neoplasm of colon, unspecified: Secondary | ICD-10-CM

## 2015-08-22 MED ORDER — SODIUM CHLORIDE 0.9 % IJ SOLN
10.0000 mL | INTRAMUSCULAR | Status: DC | PRN
  Administered 2015-08-22: 10 mL
  Filled 2015-08-22: qty 10

## 2015-08-22 MED ORDER — HEPARIN SOD (PORK) LOCK FLUSH 100 UNIT/ML IV SOLN
500.0000 [IU] | Freq: Once | INTRAVENOUS | Status: AC | PRN
Start: 2015-08-22 — End: 2015-08-22
  Administered 2015-08-22: 500 [IU]
  Filled 2015-08-22: qty 5

## 2015-08-22 NOTE — Progress Notes (Signed)
Pt states that he has emptied his ostomy bag 6-8 times since 08/20/15.  He states that this is his normal pattern.  Pt did confirm that he is taking Imodium two tablets twice a day.  He is also taking Lomotil two tablets twice a day.  He has no questions or concerns at this time.

## 2015-08-29 ENCOUNTER — Telehealth: Payer: Self-pay | Admitting: *Deleted

## 2015-08-29 ENCOUNTER — Other Ambulatory Visit: Payer: Self-pay | Admitting: Oncology

## 2015-08-29 NOTE — Telephone Encounter (Signed)
  Oncology Nurse Navigator Documentation    Navigator Encounter Type: Telephone (08/29/15 1028)  : Left VM requesting Harold Price call navigator back with update on his status (need to f/u on stool output).

## 2015-08-30 ENCOUNTER — Telehealth: Payer: Self-pay | Admitting: *Deleted

## 2015-08-30 NOTE — Telephone Encounter (Signed)
  Oncology Nurse Navigator Documentation    Navigator Encounter Type: Telephone (08/30/15 1201): Left VM with direct call back # for patient to call with update on status of ostomy output.

## 2015-08-31 ENCOUNTER — Telehealth: Payer: Self-pay | Admitting: *Deleted

## 2015-08-31 NOTE — Telephone Encounter (Signed)
Attempted to reach Harold Price today to f/u on ostomy output. Mailbox full. Will see him at [place] on 12/19.

## 2015-09-03 ENCOUNTER — Other Ambulatory Visit: Payer: Self-pay | Admitting: Oncology

## 2015-09-03 ENCOUNTER — Ambulatory Visit: Payer: Medicare Other

## 2015-09-03 ENCOUNTER — Ambulatory Visit: Payer: Medicare Other | Admitting: Oncology

## 2015-09-03 ENCOUNTER — Other Ambulatory Visit: Payer: Medicare Other

## 2015-09-05 ENCOUNTER — Telehealth: Payer: Self-pay | Admitting: Oncology

## 2015-09-05 NOTE — Telephone Encounter (Signed)
12/19 pof noted and the only avail spot on cindy is earlier in the day and will not be able to move chemo therefore will have a larger gap,leaving appointments

## 2015-09-10 ENCOUNTER — Other Ambulatory Visit: Payer: Self-pay | Admitting: Oncology

## 2015-09-11 ENCOUNTER — Other Ambulatory Visit (HOSPITAL_BASED_OUTPATIENT_CLINIC_OR_DEPARTMENT_OTHER): Payer: Medicare Other

## 2015-09-11 ENCOUNTER — Other Ambulatory Visit: Payer: Medicare Other

## 2015-09-11 ENCOUNTER — Ambulatory Visit (HOSPITAL_BASED_OUTPATIENT_CLINIC_OR_DEPARTMENT_OTHER): Payer: Medicare Other | Admitting: Nurse Practitioner

## 2015-09-11 ENCOUNTER — Encounter: Payer: Self-pay | Admitting: *Deleted

## 2015-09-11 ENCOUNTER — Encounter: Payer: Self-pay | Admitting: Nurse Practitioner

## 2015-09-11 ENCOUNTER — Ambulatory Visit: Payer: Medicare Other | Admitting: Nurse Practitioner

## 2015-09-11 ENCOUNTER — Ambulatory Visit (HOSPITAL_BASED_OUTPATIENT_CLINIC_OR_DEPARTMENT_OTHER): Payer: Medicare Other

## 2015-09-11 ENCOUNTER — Other Ambulatory Visit: Payer: Self-pay | Admitting: *Deleted

## 2015-09-11 ENCOUNTER — Telehealth: Payer: Self-pay | Admitting: Nurse Practitioner

## 2015-09-11 VITALS — BP 108/66 | HR 73 | Temp 97.6°F | Resp 16 | Wt 191.0 lb

## 2015-09-11 DIAGNOSIS — Z5111 Encounter for antineoplastic chemotherapy: Secondary | ICD-10-CM

## 2015-09-11 DIAGNOSIS — C189 Malignant neoplasm of colon, unspecified: Secondary | ICD-10-CM

## 2015-09-11 DIAGNOSIS — C180 Malignant neoplasm of cecum: Secondary | ICD-10-CM | POA: Diagnosis present

## 2015-09-11 DIAGNOSIS — D509 Iron deficiency anemia, unspecified: Secondary | ICD-10-CM | POA: Diagnosis not present

## 2015-09-11 DIAGNOSIS — D6481 Anemia due to antineoplastic chemotherapy: Secondary | ICD-10-CM

## 2015-09-11 DIAGNOSIS — T451X5A Adverse effect of antineoplastic and immunosuppressive drugs, initial encounter: Secondary | ICD-10-CM

## 2015-09-11 DIAGNOSIS — D696 Thrombocytopenia, unspecified: Secondary | ICD-10-CM

## 2015-09-11 LAB — CBC WITH DIFFERENTIAL/PLATELET
BASO%: 0.5 % (ref 0.0–2.0)
BASOS ABS: 0 10*3/uL (ref 0.0–0.1)
EOS%: 1.3 % (ref 0.0–7.0)
Eosinophils Absolute: 0.1 10*3/uL (ref 0.0–0.5)
HEMATOCRIT: 35.7 % — AB (ref 38.4–49.9)
HEMOGLOBIN: 11.2 g/dL — AB (ref 13.0–17.1)
LYMPH%: 29.2 % (ref 14.0–49.0)
MCH: 24.8 pg — AB (ref 27.2–33.4)
MCHC: 31.4 g/dL — ABNORMAL LOW (ref 32.0–36.0)
MCV: 78.7 fL — ABNORMAL LOW (ref 79.3–98.0)
MONO#: 0.6 10*3/uL (ref 0.1–0.9)
MONO%: 14.8 % — AB (ref 0.0–14.0)
NEUT%: 54.2 % (ref 39.0–75.0)
NEUTROS ABS: 2.2 10*3/uL (ref 1.5–6.5)
Platelets: 130 10*3/uL — ABNORMAL LOW (ref 140–400)
RBC: 4.53 10*6/uL (ref 4.20–5.82)
RDW: 21.4 % — ABNORMAL HIGH (ref 11.0–14.6)
WBC: 4.1 10*3/uL (ref 4.0–10.3)
lymph#: 1.2 10*3/uL (ref 0.9–3.3)

## 2015-09-11 LAB — COMPREHENSIVE METABOLIC PANEL
ALBUMIN: 3.5 g/dL (ref 3.5–5.0)
ALK PHOS: 53 U/L (ref 40–150)
AST: 16 U/L (ref 5–34)
Anion Gap: 8 mEq/L (ref 3–11)
BUN: 7.7 mg/dL (ref 7.0–26.0)
CALCIUM: 9.6 mg/dL (ref 8.4–10.4)
CO2: 25 mEq/L (ref 22–29)
CREATININE: 1 mg/dL (ref 0.7–1.3)
Chloride: 104 mEq/L (ref 98–109)
EGFR: 85 mL/min/{1.73_m2} — ABNORMAL LOW (ref >90)
GLUCOSE: 120 mg/dL (ref 70–140)
POTASSIUM: 4 meq/L (ref 3.5–5.1)
SODIUM: 137 meq/L (ref 136–145)
Total Bilirubin: 1.02 mg/dL (ref 0.20–1.20)
Total Protein: 6.4 g/dL (ref 6.4–8.3)

## 2015-09-11 MED ORDER — LEUCOVORIN CALCIUM INJECTION 100 MG
20.0000 mg/m2 | Freq: Once | INTRAMUSCULAR | Status: AC
  Administered 2015-09-11: 44 mg via INTRAVENOUS
  Filled 2015-09-11: qty 2.2

## 2015-09-11 MED ORDER — FLUOROURACIL CHEMO INJECTION 5 GM/100ML
1800.0000 mg/m2 | INTRAVENOUS | Status: DC
  Administered 2015-09-11: 3900 mg via INTRAVENOUS
  Filled 2015-09-11: qty 78

## 2015-09-11 MED ORDER — LOPERAMIDE HCL 2 MG PO CAPS: 2.0000 mg | ORAL_CAPSULE | Freq: Two times a day (BID) | ORAL | Status: DC

## 2015-09-11 MED ORDER — SODIUM CHLORIDE 0.9 % IV SOLN
Freq: Once | INTRAVENOUS | Status: AC
  Administered 2015-09-11: 14:00:00 via INTRAVENOUS
  Filled 2015-09-11: qty 4

## 2015-09-11 MED ORDER — DEXTROSE 5 % IV SOLN
Freq: Once | INTRAVENOUS | Status: AC
  Administered 2015-09-11: 14:00:00 via INTRAVENOUS

## 2015-09-11 MED ORDER — OXALIPLATIN CHEMO INJECTION 100 MG/20ML
65.0000 mg/m2 | Freq: Once | INTRAVENOUS | Status: AC
  Administered 2015-09-11: 140 mg via INTRAVENOUS
  Filled 2015-09-11: qty 20

## 2015-09-11 MED ORDER — DIPHENOXYLATE-ATROPINE 2.5-0.025 MG PO TABS: 1.0000 | ORAL_TABLET | Freq: Two times a day (BID) | ORAL | Status: DC

## 2015-09-11 MED ORDER — FLUOROURACIL CHEMO INJECTION 2.5 GM/50ML
300.0000 mg/m2 | Freq: Once | INTRAVENOUS | Status: AC
  Administered 2015-09-11: 650 mg via INTRAVENOUS
  Filled 2015-09-11: qty 13

## 2015-09-11 NOTE — Progress Notes (Signed)
Oncology Nurse Navigator Documentation  Oncology Nurse Navigator Flowsheets 09/11/2015  Referral date to RadOnc/MedOnc -  Navigator Encounter Type Treatment  Patient Visit Type Medonc  Treatment Phase Treatment # 6 FOLFOX  Barriers/Navigation Needs No barriers at this time  Education Symptom Management (diarrhea);Other-liver mets  Interventions Education Method  Referrals -  Education Method Verbal  Support Groups/Services GI  Specialty Items/DME -  Time Spent with Patient 15  Empties ostomy bag 4-5/day (baseline). Still takes Imodium and Lomotil bid. Says he is eating well and drinking fluids. Inquired of nurse if he should be worried about the cancer in liver. Made him aware that last scan showed minimal increase in size of masses and no new cancer. Will be OK to wait till after next cycle to re-scan. He asked if this meant he had cirrhosis or Hepatitis C. Reassured him that he has not tested + for Hep C or cirrhosis. Made him aware nurse has been trying to call him to check on him, but his home VM is "full". He was aware of this and will clear it. Says he gets lots of telemarketer calls, so he rarely answers phone now.

## 2015-09-11 NOTE — Telephone Encounter (Signed)
Appointments made and avs printed for patient °

## 2015-09-11 NOTE — Patient Instructions (Signed)
[facility] Discharge Instructions for Patients Receiving Chemotherapy  Today you received the following chemotherapy agents: Oxaliplatin, Leucovorin and 5FU.  To help prevent nausea and vomiting after your treatment, we encourage you to take your nausea medication: compazine 10 mg every 6 hours.   If you develop nausea and vomiting that is not controlled by your nausea medication, call the clinic.   BELOW ARE SYMPTOMS THAT SHOULD BE REPORTED IMMEDIATELY:  *FEVER GREATER THAN 100.5 F  *CHILLS WITH OR WITHOUT FEVER  NAUSEA AND VOMITING THAT IS NOT CONTROLLED WITH YOUR NAUSEA MEDICATION  *UNUSUAL SHORTNESS OF BREATH  *UNUSUAL BRUISING OR BLEEDING  TENDERNESS IN MOUTH AND THROAT WITH OR WITHOUT PRESENCE OF ULCERS  *URINARY PROBLEMS  *BOWEL PROBLEMS  UNUSUAL RASH Items with * indicate a potential emergency and should be followed up as soon as possible.  Feel free to call the clinic you have any questions or concerns. The clinic phone number is [phone].  Please show the [facility] at check-in to the Emergency Department and triage nurse.

## 2015-09-11 NOTE — Progress Notes (Signed)
[facility] OFFICE PROGRESS NOTE   Diagnosis: Colon cancer  INTERVAL HISTORY:   Mr. Bilotta returns to today for follow up of his colon cancer. He is due for cycle #6 of FOLFOX. Generally he tolerates this well. He has some cold sensitivity to the fingertips, but this does not impede his grip or fine motor skills. He is emptying his right lower quadrant ileostomy between 3-5 times daily. He continues with imodium and lomotil just twice daily at this time. His appetite is good. He has gained some weight. He denies fevers, chills, nausea, or vomiting. He has no mouth sores or rashes. His energy level is fair. He has some shortness of breath with exertion and has a cough with some white phlegm production.   Objective:  Vital signs in last 24 hours:  Blood pressure 108/66, pulse 73, temperature 97.6 F (36.4 C), temperature source Oral, resp. rate 16, weight 191 lb (86.637 kg), SpO2 100 %.   Skin: warm, dry  HEENT: sclerae anicteric, conjunctivae pink, oropharynx clear. No thrush or mucositis.  Lungs: clear to auscultation bilaterally, no rales, wheezes, or rhonci  Heart: regular rate and rhythm  Abdomen: round, soft, non tender, positive bowel sounds, right lower quadrant ileostomy with liquid stool Musculoskeletal: No focal spinal tenderness, no peripheral edema  Neuro: non focal, well oriented, positive affect   Portacath/PICC-without erythema  Lab Results:  Lab Results  Component Value Date   WBC 4.1 09/11/2015   HGB 11.2* 09/11/2015   HCT 35.7* 09/11/2015   MCV 78.7* 09/11/2015   PLT 130* 09/11/2015   NEUTROABS 2.2 09/11/2015      Lab Results  Component Value Date   CEA 3.1 06/26/2015    Imaging:  No results found.  Medications: I have reviewed the patient's current medications.  Assessment/Plan: 1. 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  Cycle 5 FOLFOX 08/20/2015 (oxaliplatin dose reduced)   2. Bleeding duodenal ulcer February 2014  3. History of heavy alcohol use  4. Enterobacter bacteremia following surgery August 2016  5. Microcytic anemia secondary to #1, taking ferrous sulfate  6. CT chest 05/15/2015-negative for metastatic disease, fatty mass at the left chest wall-low-grade neoplasm not excluded  7. Port-A-Cath placement 06/20/2015  8. Admission with a partial small bowel dysfunction versus ileus 08/14/2015-improved with NG tube decompression  9. Thrombocytopenia secondary to chemotherapy-resolved   Disposition:  Mr. Jentz is doing well today with few complaints. His stool output is back to "normal" with no evidence of continued bowel obstruction at this time. He will continue with the lowered dose of imodium and lomotil that he has been using. His cold sensitivity is stable. The labs were reviewed in detail. His anemia has improved to a hgb of 11.2 and the platelets are stable at 130 since the dose of oxaliplatin was reduced with cycle 5. He will proceed with cycle 6 of FOLFOX as planned today.   He will complete 2 more cycles of  chemotherapy prior to a restaging evaluation, per Dr. Gearldine Shown progress note from the last visit.   Mr. Lamons will return for labs and a follow up visit in 2 weeks. He understands and agrees with this plan. He has been encouraged to call with any issues  that might arise before his next visit here.   Laurie Panda, NP 09/11/2015  12:15 PM

## 2015-09-13 ENCOUNTER — Ambulatory Visit (HOSPITAL_BASED_OUTPATIENT_CLINIC_OR_DEPARTMENT_OTHER): Payer: Medicare Other

## 2015-09-13 VITALS — BP 125/50 | HR 56 | Temp 98.1°F | Resp 18

## 2015-09-13 DIAGNOSIS — C180 Malignant neoplasm of cecum: Secondary | ICD-10-CM

## 2015-09-13 DIAGNOSIS — C189 Malignant neoplasm of colon, unspecified: Secondary | ICD-10-CM

## 2015-09-13 MED ORDER — SODIUM CHLORIDE 0.9 % IJ SOLN
10.0000 mL | INTRAMUSCULAR | Status: DC | PRN
  Administered 2015-09-13: 10 mL
  Filled 2015-09-13: qty 10

## 2015-09-13 MED ORDER — HEPARIN SOD (PORK) LOCK FLUSH 100 UNIT/ML IV SOLN
500.0000 [IU] | Freq: Once | INTRAVENOUS | Status: AC | PRN
  Administered 2015-09-13: 500 [IU]
  Filled 2015-09-13: qty 5

## 2015-09-13 NOTE — Patient Instructions (Signed)

## 2015-09-13 NOTE — Progress Notes (Signed)
Patient arrived for pump D/C. No complaints of bowls being out of normal pattern.

## 2015-09-19 ENCOUNTER — Telehealth: Payer: Self-pay | Admitting: *Deleted

## 2015-09-19 NOTE — Telephone Encounter (Signed)
  Oncology Nurse Navigator Documentation    Navigator Encounter Type: Telephone (09/19/15 1040): Attempted to speak with Harold Price for status of his ostomy output and hydration. NO answer, but was able to leave a voice mail requesting he call navigator directly.

## 2015-09-23 ENCOUNTER — Other Ambulatory Visit: Payer: Self-pay | Admitting: Oncology

## 2015-09-25 ENCOUNTER — Other Ambulatory Visit (HOSPITAL_BASED_OUTPATIENT_CLINIC_OR_DEPARTMENT_OTHER): Payer: Medicare Other

## 2015-09-25 ENCOUNTER — Ambulatory Visit (HOSPITAL_BASED_OUTPATIENT_CLINIC_OR_DEPARTMENT_OTHER): Payer: Medicare Other

## 2015-09-25 ENCOUNTER — Ambulatory Visit (HOSPITAL_BASED_OUTPATIENT_CLINIC_OR_DEPARTMENT_OTHER): Payer: Medicare Other | Admitting: Oncology

## 2015-09-25 VITALS — BP 145/63 | HR 69 | Temp 97.5°F | Resp 18 | Ht 74.0 in | Wt 185.7 lb

## 2015-09-25 DIAGNOSIS — D509 Iron deficiency anemia, unspecified: Secondary | ICD-10-CM

## 2015-09-25 DIAGNOSIS — Z5111 Encounter for antineoplastic chemotherapy: Secondary | ICD-10-CM

## 2015-09-25 DIAGNOSIS — C180 Malignant neoplasm of cecum: Secondary | ICD-10-CM | POA: Diagnosis present

## 2015-09-25 DIAGNOSIS — C189 Malignant neoplasm of colon, unspecified: Secondary | ICD-10-CM

## 2015-09-25 DIAGNOSIS — G620 Drug-induced polyneuropathy: Secondary | ICD-10-CM | POA: Diagnosis not present

## 2015-09-25 LAB — COMPREHENSIVE METABOLIC PANEL
ALT: <9 U/L (ref 0–55)
AST: 16 U/L (ref 5–34)
Albumin: 3.8 g/dL (ref 3.5–5.0)
Alkaline Phosphatase: 63 U/L (ref 40–150)
Anion Gap: 9 mEq/L (ref 3–11)
BILIRUBIN TOTAL: 0.57 mg/dL (ref 0.20–1.20)
BUN: 8.8 mg/dL (ref 7.0–26.0)
CO2: 24 meq/L (ref 22–29)
Calcium: 9.7 mg/dL (ref 8.4–10.4)
Chloride: 105 mEq/L (ref 98–109)
Creatinine: 1 mg/dL (ref 0.7–1.3)
EGFR: 85 mL/min/{1.73_m2} — AB (ref >90)
GLUCOSE: 85 mg/dL (ref 70–140)
Potassium: 3.9 mEq/L (ref 3.5–5.1)
SODIUM: 138 meq/L (ref 136–145)
TOTAL PROTEIN: 6.9 g/dL (ref 6.4–8.3)

## 2015-09-25 LAB — CBC WITH DIFFERENTIAL/PLATELET
BASO%: 0.4 % (ref 0.0–2.0)
Basophils Absolute: 0 10*3/uL (ref 0.0–0.1)
EOS ABS: 0.1 10*3/uL (ref 0.0–0.5)
EOS%: 1.6 % (ref 0.0–7.0)
HCT: 37.2 % — ABNORMAL LOW (ref 38.4–49.9)
HGB: 11.7 g/dL — ABNORMAL LOW (ref 13.0–17.1)
LYMPH%: 26.5 % (ref 14.0–49.0)
MCH: 24.9 pg — ABNORMAL LOW (ref 27.2–33.4)
MCHC: 31.4 g/dL — ABNORMAL LOW (ref 32.0–36.0)
MCV: 79.5 fL (ref 79.3–98.0)
MONO#: 0.5 10*3/uL (ref 0.1–0.9)
MONO%: 10 % (ref 0.0–14.0)
NEUT%: 61.5 % (ref 39.0–75.0)
NEUTROS ABS: 3.4 10*3/uL (ref 1.5–6.5)
Platelets: 131 10*3/uL — ABNORMAL LOW (ref 140–400)
RBC: 4.67 10*6/uL (ref 4.20–5.82)
RDW: 20.9 % — AB (ref 11.0–14.6)
WBC: 5.5 10*3/uL (ref 4.0–10.3)
lymph#: 1.4 10*3/uL (ref 0.9–3.3)

## 2015-09-25 MED ORDER — SODIUM CHLORIDE 0.9 % IV SOLN
1800.0000 mg/m2 | INTRAVENOUS | Status: DC
  Administered 2015-09-25: 3900 mg via INTRAVENOUS
  Filled 2015-09-25: qty 78

## 2015-09-25 MED ORDER — LEUCOVORIN CALCIUM INJECTION 100 MG
20.0000 mg/m2 | Freq: Once | INTRAMUSCULAR | Status: AC
  Administered 2015-09-25: 44 mg via INTRAVENOUS
  Filled 2015-09-25: qty 2.2

## 2015-09-25 MED ORDER — FLUOROURACIL CHEMO INJECTION 2.5 GM/50ML
300.0000 mg/m2 | Freq: Once | INTRAVENOUS | Status: AC
  Administered 2015-09-25: 650 mg via INTRAVENOUS
  Filled 2015-09-25: qty 13

## 2015-09-25 MED ORDER — DEXTROSE 5 % IV SOLN
Freq: Once | INTRAVENOUS | Status: AC
  Administered 2015-09-25: 11:00:00 via INTRAVENOUS

## 2015-09-25 MED ORDER — SODIUM CHLORIDE 0.9 % IV SOLN
Freq: Once | INTRAVENOUS | Status: AC
  Administered 2015-09-25: 11:00:00 via INTRAVENOUS
  Filled 2015-09-25: qty 4

## 2015-09-25 MED ORDER — DEXTROSE 5 % IV SOLN
65.0000 mg/m2 | Freq: Once | INTRAVENOUS | Status: AC
  Administered 2015-09-25: 140 mg via INTRAVENOUS
  Filled 2015-09-25: qty 20

## 2015-09-25 NOTE — Progress Notes (Signed)
  [facility] OFFICE PROGRESS NOTE   Diagnosis: Colon cancer  INTERVAL HISTORY:   Harold Price returns as scheduled. He completed another cycle of FOLFOX 09/11/2015. No nausea/vomiting, mouth sores, or diarrhea following chemotherapy. He reports the stool has recently become thicker and he has backed down on the Imodium, Lomotil, and iron. He empties the ostomy bag 3-4 times per day. Good appetite. He reports cold sensitivity following chemotherapy. This has resolved. No neuropathy symptoms at present. Harold Price continues to care for his wife.  Objective:  Vital signs in last 24 hours:  Blood pressure 145/63, pulse 69, temperature 97.5 F (36.4 C), temperature source Oral, resp. rate 18, height '6\' 2"'$  (1.88 m), weight 185 lb 11.2 oz (84.233 kg), SpO2 100 %.    HEENT: No thrush or ulcers Resp: Lungs clear bilaterally  Cardio: Regular rate and rhythm GI: No hepatomegaly, right lower quadrant ileostomy Vascular: No leg edema Neuro: Moderate decrease in vibratory sense at the fingertips bilaterally  Skin: Dry flaking of the extremities   Portacath/PICC-without erythema  Lab Results:  Lab Results  Component Value Date   WBC 5.5 09/25/2015   HGB 11.7* 09/25/2015   HCT 37.2* 09/25/2015   MCV 79.5 09/25/2015   PLT 131* 09/25/2015   NEUTROABS 3.4 09/25/2015    Potassium 3.9, BUN 8.8, creatinine 1.0  Medications: I have reviewed the patient's current medications.  Assessment/Plan: 1. 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  Cycle 5 FOLFOX 08/20/2015 (oxaliplatin dose reduced)  Cycle 6 FOLFOX 09/11/2015  Cycle 7 FOLFOX 09/25/2015   2. Bleeding duodenal ulcer February 2014  3. History of heavy alcohol use  4. Enterobacter bacteremia following surgery August 2016  5. Microcytic anemia secondary to #1, taking ferrous sulfate-improved  6. CT chest 05/15/2015-negative for metastatic disease, fatty mass at the left chest wall-low-grade neoplasm not excluded  7. Port-A-Cath placement 06/20/2015  8. Admission with a partial small bowel dysfunction versus ileus 08/14/2015-improved with NG tube decompression  9. 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 The plan is to proceed with cycle 7 FOLFOX today. He will be scheduled for a restaging CT evaluation after cycle 8.  Harold Price will return for an office visit and chemotherapy in 2 weeks.  Betsy Coder, MD  09/25/2015  9:58 AM

## 2015-09-25 NOTE — Patient Instructions (Signed)
[facility] Discharge Instructions for Patients Receiving Chemotherapy  Today you received the following chemotherapy agents: Leucovorin, 5-Fu, Oxaliplatin.  To help prevent nausea and vomiting after your treatment, we encourage you to take your nausea medication.    If you develop nausea and vomiting that is not controlled by your nausea medication, call the clinic.   BELOW ARE SYMPTOMS THAT SHOULD BE REPORTED IMMEDIATELY:  *FEVER GREATER THAN 100.5 F  *CHILLS WITH OR WITHOUT FEVER  NAUSEA AND VOMITING THAT IS NOT CONTROLLED WITH YOUR NAUSEA MEDICATION  *UNUSUAL SHORTNESS OF BREATH  *UNUSUAL BRUISING OR BLEEDING  TENDERNESS IN MOUTH AND THROAT WITH OR WITHOUT PRESENCE OF ULCERS  *URINARY PROBLEMS  *BOWEL PROBLEMS  UNUSUAL RASH Items with * indicate a potential emergency and should be followed up as soon as possible.  Feel free to call the clinic you have any questions or concerns. The clinic phone number is [phone].  Please show the [facility] at check-in to the Emergency Department and triage nurse.

## 2015-09-27 ENCOUNTER — Telehealth: Payer: Self-pay | Admitting: Oncology

## 2015-09-27 ENCOUNTER — Ambulatory Visit (HOSPITAL_BASED_OUTPATIENT_CLINIC_OR_DEPARTMENT_OTHER): Payer: Medicare Other

## 2015-09-27 VITALS — BP 113/43 | HR 59 | Temp 97.7°F | Resp 16

## 2015-09-27 DIAGNOSIS — C189 Malignant neoplasm of colon, unspecified: Secondary | ICD-10-CM

## 2015-09-27 DIAGNOSIS — C180 Malignant neoplasm of cecum: Secondary | ICD-10-CM | POA: Diagnosis not present

## 2015-09-27 MED ORDER — SODIUM CHLORIDE 0.9 % IJ SOLN
10.0000 mL | INTRAMUSCULAR | Status: DC | PRN
Start: 2015-09-27 — End: 2015-09-27
  Administered 2015-09-27: 10 mL
  Filled 2015-09-27: qty 10

## 2015-09-27 MED ORDER — HEPARIN SOD (PORK) LOCK FLUSH 100 UNIT/ML IV SOLN
500.0000 [IU] | Freq: Once | INTRAVENOUS | Status: AC | PRN
  Administered 2015-09-27: 500 [IU]
  Filled 2015-09-27: qty 5

## 2015-09-27 NOTE — Telephone Encounter (Signed)
Gv pt appt for 1/24 + 1/26.

## 2015-10-07 ENCOUNTER — Other Ambulatory Visit: Payer: Self-pay | Admitting: Oncology

## 2015-10-08 ENCOUNTER — Other Ambulatory Visit: Payer: Self-pay | Admitting: *Deleted

## 2015-10-08 DIAGNOSIS — C189 Malignant neoplasm of colon, unspecified: Secondary | ICD-10-CM

## 2015-10-09 ENCOUNTER — Other Ambulatory Visit (HOSPITAL_BASED_OUTPATIENT_CLINIC_OR_DEPARTMENT_OTHER): Payer: Medicare Other

## 2015-10-09 ENCOUNTER — Ambulatory Visit (HOSPITAL_BASED_OUTPATIENT_CLINIC_OR_DEPARTMENT_OTHER): Payer: Medicare Other

## 2015-10-09 ENCOUNTER — Telehealth: Payer: Self-pay | Admitting: Nurse Practitioner

## 2015-10-09 ENCOUNTER — Ambulatory Visit (HOSPITAL_BASED_OUTPATIENT_CLINIC_OR_DEPARTMENT_OTHER): Payer: Medicare Other | Admitting: Nurse Practitioner

## 2015-10-09 ENCOUNTER — Telehealth: Payer: Self-pay | Admitting: *Deleted

## 2015-10-09 VITALS — BP 136/54 | HR 65 | Temp 97.9°F | Resp 16 | Wt 194.4 lb

## 2015-10-09 DIAGNOSIS — C180 Malignant neoplasm of cecum: Secondary | ICD-10-CM | POA: Diagnosis not present

## 2015-10-09 DIAGNOSIS — C189 Malignant neoplasm of colon, unspecified: Secondary | ICD-10-CM

## 2015-10-09 DIAGNOSIS — Z5111 Encounter for antineoplastic chemotherapy: Secondary | ICD-10-CM | POA: Diagnosis not present

## 2015-10-09 DIAGNOSIS — G620 Drug-induced polyneuropathy: Secondary | ICD-10-CM | POA: Diagnosis not present

## 2015-10-09 DIAGNOSIS — D6959 Other secondary thrombocytopenia: Secondary | ICD-10-CM

## 2015-10-09 LAB — CBC WITH DIFFERENTIAL/PLATELET
BASO%: 0.4 % (ref 0.0–2.0)
Basophils Absolute: 0 10*3/uL (ref 0.0–0.1)
EOS%: 2.7 % (ref 0.0–7.0)
Eosinophils Absolute: 0.1 10*3/uL (ref 0.0–0.5)
HEMATOCRIT: 31.6 % — AB (ref 38.4–49.9)
HGB: 10.2 g/dL — ABNORMAL LOW (ref 13.0–17.1)
LYMPH%: 31.9 % (ref 14.0–49.0)
MCH: 25.6 pg — AB (ref 27.2–33.4)
MCHC: 32.3 g/dL (ref 32.0–36.0)
MCV: 79.4 fL (ref 79.3–98.0)
MONO#: 0.6 10*3/uL (ref 0.1–0.9)
MONO%: 12.4 % (ref 0.0–14.0)
NEUT%: 52.6 % (ref 39.0–75.0)
NEUTROS ABS: 2.4 10*3/uL (ref 1.5–6.5)
Platelets: 78 10*3/uL — ABNORMAL LOW (ref 140–400)
RBC: 3.98 10*6/uL — AB (ref 4.20–5.82)
RDW: 18.7 % — ABNORMAL HIGH (ref 11.0–14.6)
WBC: 4.5 10*3/uL (ref 4.0–10.3)
lymph#: 1.4 10*3/uL (ref 0.9–3.3)
nRBC: 0 % (ref 0–0)

## 2015-10-09 LAB — COMPREHENSIVE METABOLIC PANEL
ANION GAP: 6 meq/L (ref 3–11)
AST: 19 U/L (ref 5–34)
Albumin: 3.4 g/dL — ABNORMAL LOW (ref 3.5–5.0)
Alkaline Phosphatase: 54 U/L (ref 40–150)
BUN: 10 mg/dL (ref 7.0–26.0)
CALCIUM: 9.2 mg/dL (ref 8.4–10.4)
CHLORIDE: 107 meq/L (ref 98–109)
CO2: 24 meq/L (ref 22–29)
Creatinine: 0.9 mg/dL (ref 0.7–1.3)
EGFR: >90 mL/min/{1.73_m2} (ref >90)
Glucose: 93 mg/dl (ref 70–140)
POTASSIUM: 3.7 meq/L (ref 3.5–5.1)
Sodium: 137 mEq/L (ref 136–145)
Total Bilirubin: 0.55 mg/dL (ref 0.20–1.20)
Total Protein: 6 g/dL — ABNORMAL LOW (ref 6.4–8.3)

## 2015-10-09 MED ORDER — LOPERAMIDE HCL 2 MG PO CAPS: 2.0000 mg | ORAL_CAPSULE | Freq: Two times a day (BID) | ORAL | Status: AC

## 2015-10-09 MED ORDER — FLUOROURACIL CHEMO INJECTION 2.5 GM/50ML
300.0000 mg/m2 | Freq: Once | INTRAVENOUS | Status: AC
  Administered 2015-10-09: 650 mg via INTRAVENOUS
  Filled 2015-10-09: qty 13

## 2015-10-09 MED ORDER — SODIUM CHLORIDE 0.9 % IV SOLN
1800.0000 mg/m2 | INTRAVENOUS | Status: DC
  Administered 2015-10-09: 3900 mg via INTRAVENOUS
  Filled 2015-10-09: qty 78

## 2015-10-09 MED ORDER — LEUCOVORIN CALCIUM INJECTION 100 MG
20.0000 mg/m2 | Freq: Once | INTRAMUSCULAR | Status: AC
  Administered 2015-10-09: 44 mg via INTRAVENOUS
  Filled 2015-10-09: qty 2.2

## 2015-10-09 NOTE — Telephone Encounter (Signed)
Pt confirmed labs/ov per 01/24 POF, gave pt AVS and Calendar.... KJ, sent msg to add chemo

## 2015-10-09 NOTE — Progress Notes (Signed)
Ok to treat today per Ned Card, NP.  Oxaliplatin and associated pre meds removed from treatment plan.

## 2015-10-09 NOTE — Telephone Encounter (Signed)
Per staff message and POF I have scheduled appts. Advised scheduler of appts. JMW  

## 2015-10-09 NOTE — Progress Notes (Signed)
  [facility] OFFICE PROGRESS NOTE   Diagnosis:  Colon cancer  INTERVAL HISTORY:   Harold Price returns as scheduled. He completed cycle 7 FOLFOX 09/25/2015. He denies nausea/vomiting. No mouth sores. Stool is intermittently thick and thin. He takes Lomotil and Imodium. He has mild numbness in the fingertips. The numbness does not interfere with activity. He denies pain. No bleeding.  Objective:  Vital signs in last 24 hours:  Blood pressure 136/54, pulse 65, temperature 97.9 F (36.6 C), temperature source Oral, resp. rate 16, weight 194 lb 7 oz (88.196 kg), SpO2 100 %.    HEENT: No thrush or ulcers. Resp: Lungs clear bilaterally. Cardio: Regular rate and rhythm. GI: Abdomen soft and nontender. No hepatomegaly. Right lower quadrant ileostomy. Vascular: No leg edema. Neuro: Vibratory sense mildly to moderately decreased over the fingertips per tuning fork exam.  Port-A-Cath without erythema.    Lab Results:  Lab Results  Component Value Date   WBC 4.5 10/09/2015   HGB 10.2* 10/09/2015   HCT 31.6* 10/09/2015   MCV 79.4 10/09/2015   PLT 78* 10/09/2015   NEUTROABS 2.4 10/09/2015    Imaging:  No results found.  Medications: I have reviewed the patient's current medications.  Assessment/Plan: 1. 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  Cycle 5 FOLFOX 08/20/2015  (oxaliplatin dose reduced)  Cycle 6 FOLFOX 09/11/2015  Cycle 7 FOLFOX 09/25/2015   2. Bleeding duodenal ulcer February 2014  3. History of heavy alcohol use  4. Enterobacter bacteremia following surgery August 2016  5. Microcytic anemia secondary to #1, taking ferrous sulfate-improved  6. CT chest 05/15/2015-negative for metastatic disease, fatty mass at the left chest wall-low-grade neoplasm not excluded  7. Port-A-Cath placement 06/20/2015  8. Admission with a partial small bowel dysfunction versus ileus 08/14/2015-improved with NG tube decompression  9. 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 He has completed 7 cycles of FOLFOX. Plan to proceed with cycle 8 today as scheduled. Oxaliplatin will be held due to progressive thrombocytopenia. He understands to contact the office with any bleeding.  He will undergo restaging CT scans prior to his next visit.  He will return for a follow-up visit and cycle 9 FOLFOX in 2 weeks. He will contact the office in the interim as outlined above or with any other problems.  Plan reviewed with Dr. Benay Spice.      Ned Card ANP/GNP-BC   10/09/2015  10:36 AM

## 2015-10-09 NOTE — Telephone Encounter (Signed)
pof has been ammended

## 2015-10-09 NOTE — Telephone Encounter (Signed)
per pof to sch pt appt-Gave pt copy of avs & contrast

## 2015-10-11 ENCOUNTER — Ambulatory Visit (HOSPITAL_BASED_OUTPATIENT_CLINIC_OR_DEPARTMENT_OTHER): Payer: Medicare Other

## 2015-10-11 VITALS — BP 123/57 | HR 62 | Temp 98.0°F | Resp 16

## 2015-10-11 DIAGNOSIS — C180 Malignant neoplasm of cecum: Secondary | ICD-10-CM | POA: Diagnosis not present

## 2015-10-11 DIAGNOSIS — C189 Malignant neoplasm of colon, unspecified: Secondary | ICD-10-CM

## 2015-10-11 MED ORDER — SODIUM CHLORIDE 0.9 % IJ SOLN
10.0000 mL | INTRAMUSCULAR | Status: DC | PRN
  Administered 2015-10-11: 10 mL
  Filled 2015-10-11: qty 10

## 2015-10-11 MED ORDER — HEPARIN SOD (PORK) LOCK FLUSH 100 UNIT/ML IV SOLN
500.0000 [IU] | Freq: Once | INTRAVENOUS | Status: AC | PRN
  Administered 2015-10-11: 500 [IU]
  Filled 2015-10-11: qty 5

## 2015-10-11 NOTE — Patient Instructions (Signed)

## 2015-10-19 ENCOUNTER — Ambulatory Visit (HOSPITAL_COMMUNITY)
Admission: RE | Admit: 2015-10-19 | Discharge: 2015-10-19 | Disposition: A | Discharge status: Home / Self Care | Payer: Medicare Other | Source: Ambulatory Visit | Attending: Nurse Practitioner | Admitting: Nurse Practitioner

## 2015-10-19 ENCOUNTER — Encounter (HOSPITAL_COMMUNITY): Payer: Self-pay

## 2015-10-19 DIAGNOSIS — K769 Liver disease, unspecified: Secondary | ICD-10-CM | POA: Diagnosis not present

## 2015-10-19 DIAGNOSIS — Z9889 Other specified postprocedural states: Secondary | ICD-10-CM | POA: Diagnosis not present

## 2015-10-19 DIAGNOSIS — C189 Malignant neoplasm of colon, unspecified: Secondary | ICD-10-CM | POA: Insufficient documentation

## 2015-10-19 MED ORDER — IOHEXOL 300 MG/ML  SOLN
100.0000 mL | Freq: Once | INTRAMUSCULAR | Status: AC | PRN
  Administered 2015-10-19: 100 mL via INTRAVENOUS

## 2015-10-23 ENCOUNTER — Telehealth: Payer: Self-pay | Admitting: Nurse Practitioner

## 2015-10-23 ENCOUNTER — Ambulatory Visit: Payer: Medicare Other

## 2015-10-23 ENCOUNTER — Other Ambulatory Visit (HOSPITAL_BASED_OUTPATIENT_CLINIC_OR_DEPARTMENT_OTHER): Payer: Medicare Other

## 2015-10-23 ENCOUNTER — Other Ambulatory Visit: Payer: Self-pay | Admitting: Nurse Practitioner

## 2015-10-23 ENCOUNTER — Ambulatory Visit (HOSPITAL_BASED_OUTPATIENT_CLINIC_OR_DEPARTMENT_OTHER): Payer: Medicare Other | Admitting: Nurse Practitioner

## 2015-10-23 VITALS — BP 134/50 | HR 66 | Temp 97.1°F | Resp 18 | Ht 74.0 in | Wt 191.1 lb

## 2015-10-23 DIAGNOSIS — G620 Drug-induced polyneuropathy: Secondary | ICD-10-CM

## 2015-10-23 DIAGNOSIS — C189 Malignant neoplasm of colon, unspecified: Secondary | ICD-10-CM

## 2015-10-23 DIAGNOSIS — C180 Malignant neoplasm of cecum: Secondary | ICD-10-CM

## 2015-10-23 DIAGNOSIS — K769 Liver disease, unspecified: Secondary | ICD-10-CM

## 2015-10-23 DIAGNOSIS — D509 Iron deficiency anemia, unspecified: Secondary | ICD-10-CM | POA: Diagnosis not present

## 2015-10-23 LAB — CBC WITH DIFFERENTIAL/PLATELET
BASO%: 0.7 % (ref 0.0–2.0)
Basophils Absolute: 0 10*3/uL (ref 0.0–0.1)
EOS%: 5.8 % (ref 0.0–7.0)
Eosinophils Absolute: 0.3 10*3/uL (ref 0.0–0.5)
HEMATOCRIT: 34.7 % — AB (ref 38.4–49.9)
HGB: 11 g/dL — ABNORMAL LOW (ref 13.0–17.1)
LYMPH#: 1 10*3/uL (ref 0.9–3.3)
LYMPH%: 23.6 % (ref 14.0–49.0)
MCH: 25.7 pg — ABNORMAL LOW (ref 27.2–33.4)
MCHC: 31.8 g/dL — ABNORMAL LOW (ref 32.0–36.0)
MCV: 80.7 fL (ref 79.3–98.0)
MONO#: 0.5 10*3/uL (ref 0.1–0.9)
MONO%: 11 % (ref 0.0–14.0)
NEUT%: 58.9 % (ref 39.0–75.0)
NEUTROS ABS: 2.6 10*3/uL (ref 1.5–6.5)
PLATELETS: 107 10*3/uL — AB (ref 140–400)
RBC: 4.3 10*6/uL (ref 4.20–5.82)
RDW: 19.7 % — ABNORMAL HIGH (ref 11.0–14.6)
WBC: 4.3 10*3/uL (ref 4.0–10.3)

## 2015-10-23 LAB — COMPREHENSIVE METABOLIC PANEL
ALBUMIN: 3.7 g/dL (ref 3.5–5.0)
ANION GAP: 9 meq/L (ref 3–11)
AST: 20 U/L (ref 5–34)
Alkaline Phosphatase: 57 U/L (ref 40–150)
BILIRUBIN TOTAL: 0.83 mg/dL (ref 0.20–1.20)
BUN: 10.3 mg/dL (ref 7.0–26.0)
CALCIUM: 9.9 mg/dL (ref 8.4–10.4)
CO2: 25 mEq/L (ref 22–29)
CREATININE: 1.2 mg/dL (ref 0.7–1.3)
Chloride: 105 mEq/L (ref 98–109)
EGFR: 68 mL/min/{1.73_m2} — ABNORMAL LOW (ref >90)
Glucose: 139 mg/dl (ref 70–140)
Potassium: 4.1 mEq/L (ref 3.5–5.1)
Sodium: 139 mEq/L (ref 136–145)
TOTAL PROTEIN: 6.6 g/dL (ref 6.4–8.3)

## 2015-10-23 MED ORDER — DIPHENOXYLATE-ATROPINE 2.5-0.025 MG PO TABS: 1.0000 | ORAL_TABLET | Freq: Two times a day (BID) | ORAL | Status: DC

## 2015-10-23 NOTE — Telephone Encounter (Signed)
Pt confirmed labs/ov per 02/07 POF, gave pt AVS and Calendar... KJ,

## 2015-10-23 NOTE — Progress Notes (Addendum)
  [place] OFFICE PROGRESS NOTE   Diagnosis:  Colon cancer  INTERVAL HISTORY:   Harold Price returns as scheduled. He completed cycle 8 FOLFOX 10/09/2015. He denies nausea/vomiting. No mouth sores. He notes less diarrhea. He continues Lomotil and Imodium. No numbness or tingling in his hands or feet. He denies abdominal pain. Appetite is stable.  Objective:  Vital signs in last 24 hours:  Blood pressure 134/50, pulse 66, temperature 97.1 F (36.2 C), temperature source Oral, resp. rate 18, height '6\' 2"'$  (1.88 m), weight 191 lb 1.6 oz (86.682 kg), SpO2 100 %.    HEENT: No thrush or ulcers. Resp: Lungs clear bilaterally. Cardio: Regular rate and rhythm. GI: Abdomen soft and nontender. Right lower quadrant ileostomy. No hepatomegaly. Vascular: Trace bilateral pretibial edema. Port-A-Cath without erythema.  Lab Results:  Lab Results  Component Value Date   WBC 4.3 10/23/2015   HGB 11.0* 10/23/2015   HCT 34.7* 10/23/2015   MCV 80.7 10/23/2015   PLT 107* 10/23/2015   NEUTROABS 2.6 10/23/2015    Imaging:  No results found.  Medications: I have reviewed the patient's current medications.  Assessment/Plan: 1. 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  Cycle 5 FOLFOX 08/20/2015 (oxaliplatin dose reduced)  Cycle 6 FOLFOX 09/11/2015  Cycle 7 FOLFOX  09/25/2015  Cycle 8 FOLFOX 10/09/2015 (oxaliplatin held due to thrombocytopenia)  Restaging CT abdomen and pelvis 10/19/2015 with some lesions appearing smaller with the majority stable to slightly larger.  10/23/2015 discussed FOLFIRI versus a treatment break. We mutually decided on a treatment break.   2. Bleeding duodenal ulcer February 2014  3. History of heavy alcohol use  4. Enterobacter bacteremia following surgery August 2016  5. Microcytic anemia secondary to #1, taking ferrous sulfate-improved  6. CT chest 05/15/2015-negative for metastatic disease, fatty mass at the left chest wall-low-grade neoplasm not excluded  7. Port-A-Cath placement 06/20/2015  8. Admission with a partial small bowel dysfunction versus ileus 08/14/2015-improved with NG tube decompression  9. History of thrombocytopenia secondary to chemotherapy  10. Oxaliplatin neuropathy    Disposition: Harold Price has completed 8 cycles of FOLFOX. The recent restaging CT evaluation shows somewhat of a mixed response with some lesions smaller but the majority stable to slightly larger. He has likely achieved maximal benefit from the current chemotherapy regimen. We discussed changing treatment to FOLFIRI versus a treatment break. He is most comfortable with a treatment break. He will return for a follow-up visit in 3 weeks for reevaluation and additional discussion.  Patient seen with Dr. Benay Spice.    Ned Card ANP/GNP-BC   10/23/2015  9:56 AM  this was a shared visit with Ned Card. The restaging CT showed a mixed response in the liver. We discussed the restaging CT findings with Harold Price. I discussed changing to FOLFIRI chemotherapy. He does not wish to begin FOLFIRI at present.   I reviewed the CT images. Julieanne Manson, M.D.

## 2015-11-06 ENCOUNTER — Ambulatory Visit: Payer: Medicare Other

## 2015-11-06 NOTE — Progress Notes (Signed)
Pt arrived for his scheduled appt today. Pt is not getting treatment at this time. Dr. Benay Spice confirmed this today. Gave pt new copy of his schedule and that he is to come back next tues for lab and dr appt. Pt understands and verbalized plan for future appt.

## 2015-11-13 ENCOUNTER — Telehealth: Payer: Self-pay | Admitting: Oncology

## 2015-11-13 ENCOUNTER — Ambulatory Visit (HOSPITAL_BASED_OUTPATIENT_CLINIC_OR_DEPARTMENT_OTHER): Payer: Medicare Other | Admitting: Oncology

## 2015-11-13 ENCOUNTER — Other Ambulatory Visit (HOSPITAL_BASED_OUTPATIENT_CLINIC_OR_DEPARTMENT_OTHER): Payer: Medicare Other

## 2015-11-13 ENCOUNTER — Ambulatory Visit (HOSPITAL_BASED_OUTPATIENT_CLINIC_OR_DEPARTMENT_OTHER): Payer: Medicare Other

## 2015-11-13 ENCOUNTER — Encounter: Payer: Self-pay | Admitting: *Deleted

## 2015-11-13 VITALS — BP 126/49 | HR 72 | Temp 98.3°F | Resp 17 | Ht 74.0 in | Wt 191.6 lb

## 2015-11-13 DIAGNOSIS — D509 Iron deficiency anemia, unspecified: Secondary | ICD-10-CM | POA: Diagnosis not present

## 2015-11-13 DIAGNOSIS — Z95828 Presence of other vascular implants and grafts: Secondary | ICD-10-CM

## 2015-11-13 DIAGNOSIS — C180 Malignant neoplasm of cecum: Secondary | ICD-10-CM

## 2015-11-13 DIAGNOSIS — K769 Liver disease, unspecified: Secondary | ICD-10-CM | POA: Diagnosis not present

## 2015-11-13 DIAGNOSIS — C182 Malignant neoplasm of ascending colon: Secondary | ICD-10-CM

## 2015-11-13 DIAGNOSIS — G620 Drug-induced polyneuropathy: Secondary | ICD-10-CM

## 2015-11-13 DIAGNOSIS — C189 Malignant neoplasm of colon, unspecified: Secondary | ICD-10-CM

## 2015-11-13 LAB — CBC WITH DIFFERENTIAL/PLATELET
BASO%: 0.3 % (ref 0.0–2.0)
BASOS ABS: 0 10*3/uL (ref 0.0–0.1)
EOS ABS: 0.2 10*3/uL (ref 0.0–0.5)
EOS%: 4.2 % (ref 0.0–7.0)
HCT: 34.8 % — ABNORMAL LOW (ref 38.4–49.9)
HEMOGLOBIN: 11.4 g/dL — AB (ref 13.0–17.1)
LYMPH#: 1.3 10*3/uL (ref 0.9–3.3)
LYMPH%: 22.6 % (ref 14.0–49.0)
MCH: 26.4 pg — ABNORMAL LOW (ref 27.2–33.4)
MCHC: 32.8 g/dL (ref 32.0–36.0)
MCV: 80.6 fL (ref 79.3–98.0)
MONO#: 0.6 10*3/uL (ref 0.1–0.9)
MONO%: 10.1 % (ref 0.0–14.0)
NEUT%: 62.8 % (ref 39.0–75.0)
NEUTROS ABS: 3.6 10*3/uL (ref 1.5–6.5)
NRBC: 0 % (ref 0–0)
PLATELETS: 123 10*3/uL — AB (ref 140–400)
RBC: 4.32 10*6/uL (ref 4.20–5.82)
RDW: 15.8 % — AB (ref 11.0–14.6)
WBC: 5.7 10*3/uL (ref 4.0–10.3)

## 2015-11-13 MED ORDER — HEPARIN SOD (PORK) LOCK FLUSH 100 UNIT/ML IV SOLN
500.0000 [IU] | Freq: Once | INTRAVENOUS | Status: AC
  Administered 2015-11-13: 500 [IU] via INTRAVENOUS
  Filled 2015-11-13: qty 5

## 2015-11-13 MED ORDER — SODIUM CHLORIDE 0.9% FLUSH
10.0000 mL | INTRAVENOUS | Status: DC | PRN
  Administered 2015-11-13: 10 mL via INTRAVENOUS
  Filled 2015-11-13: qty 10

## 2015-11-13 NOTE — Telephone Encounter (Signed)
Appt with Dr Donne Hazel set for March 17 at 940 am

## 2015-11-13 NOTE — Patient Instructions (Signed)

## 2015-11-13 NOTE — Telephone Encounter (Signed)
appt made and avs printed °

## 2015-11-13 NOTE — Progress Notes (Signed)
  Oncology Nurse Navigator Documentation  Navigator Location: [facility] (11/13/15 1624) Navigator Encounter Type: Letter/Fax/Email (11/13/15 1624)  Email to [facility] pathology to run [facility] Testing on following case:  Patient: Harold Price, Harold Price Collected: 04/20/2015 Client: [facility] Accession: [id number] Received: 04/20/2015 Harold Price DOB: [birth year] Age: 82 Gender: M Reported: 04/24/2015 [address] [place] Patient Ph: [phone] MRN #: [id number] [place], [place] [address] Visit #: [id number] Chart #:

## 2015-11-13 NOTE — Progress Notes (Signed)
  [place] OFFICE PROGRESS NOTE   Diagnosis: colon cancer  INTERVAL HISTORY:   Harold Price returns as scheduled. He feels well. He empties the ileostomy bag approximate 8 times per day. He continues to have numbness in the hands and feet. No new complaint. He is taking Imodium and Lomotil.  Objective:  Vital signs in last 24 hours:  Blood pressure 126/49, pulse 72, temperature 98.3 F (36.8 C), temperature source Oral, resp. rate 17, height '6\' 2"'$  (1.88 m), weight 191 lb 9.6 oz (86.909 kg), SpO2 100 %.    HEENT: no thrush or ulcers Resp: lungs clear bilaterally Cardio: regular rate and rhythm GI: no hepatomegaly, nontender, semi-formed stool in the ostomy bag Vascular: no leg edema    Portacath/PICC-without erythema  Lab Results:  Lab Results  Component Value Date   WBC 5.7 11/13/2015   HGB 11.4* 11/13/2015   HCT 34.8* 11/13/2015   MCV 80.6 11/13/2015   PLT 123* 11/13/2015   NEUTROABS 3.6 11/13/2015    Medications: I have reviewed the patient's current medications.  Assessment/Plan: 1. 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  Cycle 5 FOLFOX 08/20/2015 (oxaliplatin dose reduced)  Cycle 6 FOLFOX 09/11/2015  Cycle 7 FOLFOX 09/25/2015  Cycle 8 FOLFOX 10/09/2015 (oxaliplatin held due to thrombocytopenia)  Restaging CT abdomen and pelvis 10/19/2015 with  some lesions appearing smaller with the majority stable to slightly larger.  10/23/2015 discussed FOLFIRI versus a treatment break. We mutually decided on a treatment break.   2. Bleeding duodenal ulcer February 2014  3. History of heavy alcohol use  4. Enterobacter bacteremia following surgery August 2016  5. Microcytic anemia secondary to #1, taking ferrous sulfate-improved  6. CT chest 05/15/2015-negative for metastatic disease, fatty mass at the left chest wall-low-grade neoplasm not excluded  7. Port-A-Cath placement 06/20/2015  8. Admission with a partial small bowel dysfunction versus ileus 08/14/2015-improved with NG tube decompression  9. History of thrombocytopenia secondary to chemotherapy-improved  10. Oxaliplatin neuropathy   Disposition:  Harold Price appears well. He has been maintained off of chemotherapy for the past month. He understands no therapy will be curative. We will consider salvage FOLFIRI chemotherapy in the future. He would like to have the ileostomy reversed. We will make a referral to Dr. Donne Hazel.  The Port-A-Cath was flushed today. Harold Price will return for an office visit in one month.we will submit foundation 1 testing on the colon tumor.  Betsy Coder, MD  11/13/2015  11:17 AM

## 2015-11-14 ENCOUNTER — Other Ambulatory Visit (HOSPITAL_COMMUNITY)
Admission: RE | Admit: 2015-11-14 | Discharge: 2015-11-14 | Disposition: A | Discharge status: Home / Self Care | Payer: Medicare Other | Source: Ambulatory Visit | Attending: Oncology | Admitting: Oncology

## 2015-11-14 DIAGNOSIS — C182 Malignant neoplasm of ascending colon: Secondary | ICD-10-CM | POA: Insufficient documentation

## 2015-11-21 ENCOUNTER — Telehealth: Payer: Self-pay | Admitting: *Deleted

## 2015-11-21 NOTE — Telephone Encounter (Signed)
Call from pt requesting refill on Lomotil. Called [facility], they still have 10/23/15 Rx on file. They will fill for pt. Co-pay $89 for 180 tabs. Pt stated he may pick up less tablets to decrease co-pay. He will discuss that with pharmacist.

## 2015-11-22 MED FILL — DIPHENOXYLATE-ATROPINE TAB: 2.5-0.025 | 30 days supply | Qty: 60 | Fill #0

## 2015-12-10 ENCOUNTER — Encounter (HOSPITAL_COMMUNITY): Payer: Self-pay

## 2015-12-11 ENCOUNTER — Ambulatory Visit (HOSPITAL_BASED_OUTPATIENT_CLINIC_OR_DEPARTMENT_OTHER): Payer: Medicare Other | Admitting: Nurse Practitioner

## 2015-12-11 ENCOUNTER — Ambulatory Visit (HOSPITAL_BASED_OUTPATIENT_CLINIC_OR_DEPARTMENT_OTHER): Payer: Medicare Other

## 2015-12-11 ENCOUNTER — Telehealth: Payer: Self-pay | Admitting: Nurse Practitioner

## 2015-12-11 ENCOUNTER — Encounter: Payer: Self-pay | Admitting: Nurse Practitioner

## 2015-12-11 VITALS — BP 145/59 | HR 70 | Temp 98.0°F | Resp 19 | Wt 187.6 lb

## 2015-12-11 DIAGNOSIS — D509 Iron deficiency anemia, unspecified: Secondary | ICD-10-CM

## 2015-12-11 DIAGNOSIS — K769 Liver disease, unspecified: Secondary | ICD-10-CM | POA: Diagnosis not present

## 2015-12-11 DIAGNOSIS — G620 Drug-induced polyneuropathy: Secondary | ICD-10-CM | POA: Diagnosis not present

## 2015-12-11 DIAGNOSIS — C180 Malignant neoplasm of cecum: Secondary | ICD-10-CM

## 2015-12-11 DIAGNOSIS — C189 Malignant neoplasm of colon, unspecified: Secondary | ICD-10-CM

## 2015-12-11 DIAGNOSIS — C787 Secondary malignant neoplasm of liver and intrahepatic bile duct: Principal | ICD-10-CM

## 2015-12-11 DIAGNOSIS — Z95828 Presence of other vascular implants and grafts: Secondary | ICD-10-CM

## 2015-12-11 MED ORDER — HEPARIN SOD (PORK) LOCK FLUSH 100 UNIT/ML IV SOLN
500.0000 [IU] | Freq: Once | INTRAVENOUS | Status: AC
  Administered 2015-12-11: 500 [IU] via INTRAVENOUS
  Filled 2015-12-11: qty 5

## 2015-12-11 MED ORDER — SODIUM CHLORIDE 0.9% FLUSH
10.0000 mL | INTRAVENOUS | Status: DC | PRN
  Administered 2015-12-11: 10 mL via INTRAVENOUS
  Filled 2015-12-11: qty 10

## 2015-12-11 NOTE — Telephone Encounter (Signed)
per pof to ch pt appt-*cld pt no answer-mailed copy of sch

## 2015-12-11 NOTE — Patient Instructions (Signed)

## 2015-12-11 NOTE — Progress Notes (Signed)
  [place] OFFICE PROGRESS NOTE   Diagnosis:  Colon cancer  INTERVAL HISTORY:   Harold Price returns as scheduled. He overall feels well. He estimates emptying the ostomy bag 4-5 times a day. He takes Imodium and Lomotil as needed. No nausea or vomiting. No pain. He has a good appetite.  Objective:  Vital signs in last 24 hours:  Blood pressure 145/59, pulse 70, temperature 98 F (36.7 C), temperature source Oral, resp. rate 19, weight 187 lb 9.6 oz (85.095 kg), SpO2 100 %.    HEENT: No thrush or ulcers. Resp: Lungs clear bilaterally. Cardio: Regular rate and rhythm. GI: No hepatomegaly. Right abdomen ileostomy. Vascular: No leg edema. Port-A-Cath without erythema.  Lab Results:  Lab Results  Component Value Date   WBC 5.7 11/13/2015   HGB 11.4* 11/13/2015   HCT 34.8* 11/13/2015   MCV 80.6 11/13/2015   PLT 123* 11/13/2015   NEUTROABS 3.6 11/13/2015    Imaging:  No results found.  Medications: I have reviewed the patient's current medications.  Assessment/Plan: 1. 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  Cycle 5 FOLFOX 08/20/2015 (oxaliplatin dose reduced)  Cycle 6 FOLFOX 09/11/2015  Cycle 7 FOLFOX 09/25/2015  Cycle 8 FOLFOX 10/09/2015 (oxaliplatin held due to thrombocytopenia)  Restaging CT abdomen and pelvis 10/19/2015 with some lesions appearing  smaller with the majority stable to slightly larger.  10/23/2015 discussed FOLFIRI versus a treatment break. We mutually decided on a treatment break.   2. Bleeding duodenal ulcer February 2014  3. History of heavy alcohol use  4. Enterobacter bacteremia following surgery August 2016  5. Microcytic anemia secondary to #1, taking ferrous sulfate-improved  6. CT chest 05/15/2015-negative for metastatic disease, fatty mass at the left chest wall-low-grade neoplasm not excluded  7. Port-A-Cath placement 06/20/2015  8. Admission with a partial small bowel dysfunction versus ileus 08/14/2015-improved with NG tube decompression  9. History of thrombocytopenia secondary to chemotherapy-improved  10. Oxaliplatin neuropathy    Disposition: Harold Price appears stable. He has been maintained off of chemotherapy for a little greater than 2 months. We are referring him for restaging CT scans in 3 weeks. He will return for follow-up visit one to 2 days after the scans to review the results. He will contact the office in the interim with any problems.  Plan reviewed with Dr. Benay Spice.    Ned Card ANP/GNP-BC   12/11/2015  1:46 PM

## 2015-12-18 ENCOUNTER — Other Ambulatory Visit: Payer: Self-pay | Admitting: *Deleted

## 2015-12-19 ENCOUNTER — Telehealth: Payer: Self-pay | Admitting: Oncology

## 2015-12-19 NOTE — Telephone Encounter (Signed)
Unable to reach patient. Left vm for Naida Sleight to inform her of pt lab appt time on 4/21 prior to CT scan.

## 2016-01-02 MED FILL — DIPHENOXYLATE-ATROPINE TAB: 2.5-0.025 | 30 days supply | Qty: 60 | Fill #1

## 2016-01-04 ENCOUNTER — Telehealth: Payer: Self-pay | Admitting: Oncology

## 2016-01-04 ENCOUNTER — Other Ambulatory Visit: Payer: Medicare Other

## 2016-01-04 ENCOUNTER — Ambulatory Visit (HOSPITAL_BASED_OUTPATIENT_CLINIC_OR_DEPARTMENT_OTHER): Payer: Medicare Other | Admitting: Oncology

## 2016-01-04 ENCOUNTER — Ambulatory Visit (HOSPITAL_COMMUNITY)
Admission: RE | Admit: 2016-01-04 | Discharge: 2016-01-04 | Disposition: A | Discharge status: Home / Self Care | Payer: Medicare Other | Source: Ambulatory Visit | Attending: Nurse Practitioner | Admitting: Nurse Practitioner

## 2016-01-04 ENCOUNTER — Ambulatory Visit: Payer: Medicare Other | Admitting: Nurse Practitioner

## 2016-01-04 ENCOUNTER — Other Ambulatory Visit (HOSPITAL_BASED_OUTPATIENT_CLINIC_OR_DEPARTMENT_OTHER): Payer: Medicare Other

## 2016-01-04 ENCOUNTER — Telehealth: Payer: Self-pay | Admitting: *Deleted

## 2016-01-04 VITALS — BP 118/50 | HR 64 | Temp 97.9°F | Resp 18 | Ht 74.0 in | Wt 187.8 lb

## 2016-01-04 DIAGNOSIS — D509 Iron deficiency anemia, unspecified: Secondary | ICD-10-CM | POA: Diagnosis not present

## 2016-01-04 DIAGNOSIS — C787 Secondary malignant neoplasm of liver and intrahepatic bile duct: Secondary | ICD-10-CM | POA: Insufficient documentation

## 2016-01-04 DIAGNOSIS — C189 Malignant neoplasm of colon, unspecified: Secondary | ICD-10-CM | POA: Diagnosis not present

## 2016-01-04 DIAGNOSIS — G620 Drug-induced polyneuropathy: Secondary | ICD-10-CM

## 2016-01-04 DIAGNOSIS — C180 Malignant neoplasm of cecum: Secondary | ICD-10-CM

## 2016-01-04 DIAGNOSIS — N400 Enlarged prostate without lower urinary tract symptoms: Secondary | ICD-10-CM | POA: Diagnosis not present

## 2016-01-04 DIAGNOSIS — K8689 Other specified diseases of pancreas: Secondary | ICD-10-CM | POA: Insufficient documentation

## 2016-01-04 LAB — CBC WITH DIFFERENTIAL/PLATELET
BASO%: 0.4 % (ref 0.0–2.0)
BASOS ABS: 0 10*3/uL (ref 0.0–0.1)
EOS ABS: 0.2 10*3/uL (ref 0.0–0.5)
EOS%: 2.8 % (ref 0.0–7.0)
HCT: 35.3 % — ABNORMAL LOW (ref 38.4–49.9)
HEMOGLOBIN: 11.2 g/dL — AB (ref 13.0–17.1)
LYMPH%: 21.5 % (ref 14.0–49.0)
MCH: 24.9 pg — ABNORMAL LOW (ref 27.2–33.4)
MCHC: 31.7 g/dL — ABNORMAL LOW (ref 32.0–36.0)
MCV: 78.5 fL — AB (ref 79.3–98.0)
MONO#: 0.6 10*3/uL (ref 0.1–0.9)
MONO%: 8.4 % (ref 0.0–14.0)
NEUT#: 4.6 10*3/uL (ref 1.5–6.5)
NEUT%: 66.9 % (ref 39.0–75.0)
PLATELETS: 140 10*3/uL (ref 140–400)
RBC: 4.49 10*6/uL (ref 4.20–5.82)
RDW: 14.8 % — AB (ref 11.0–14.6)
WBC: 6.9 10*3/uL (ref 4.0–10.3)
lymph#: 1.5 10*3/uL (ref 0.9–3.3)

## 2016-01-04 LAB — COMPREHENSIVE METABOLIC PANEL
ALBUMIN: 3.5 g/dL (ref 3.5–5.0)
ALK PHOS: 178 U/L — AB (ref 40–150)
ALT: 16 U/L (ref 0–55)
ANION GAP: 9 meq/L (ref 3–11)
AST: 50 U/L — ABNORMAL HIGH (ref 5–34)
BILIRUBIN TOTAL: 0.61 mg/dL (ref 0.20–1.20)
BUN: 16.4 mg/dL (ref 7.0–26.0)
CO2: 25 mEq/L (ref 22–29)
Calcium: 9.8 mg/dL (ref 8.4–10.4)
Chloride: 105 mEq/L (ref 98–109)
Creatinine: 1 mg/dL (ref 0.7–1.3)
EGFR: 81 mL/min/{1.73_m2} — AB (ref >90)
Glucose: 83 mg/dl (ref 70–140)
Potassium: 4.3 mEq/L (ref 3.5–5.1)
Sodium: 139 mEq/L (ref 136–145)
TOTAL PROTEIN: 6.7 g/dL (ref 6.4–8.3)

## 2016-01-04 MED ORDER — IOPAMIDOL (ISOVUE-300) INJECTION 61%
100.0000 mL | Freq: Once | INTRAVENOUS | Status: AC | PRN
  Administered 2016-01-04: 100 mL via INTRAVENOUS

## 2016-01-04 NOTE — Progress Notes (Signed)
[place] OFFICE PROGRESS NOTE   Diagnosis: Colon cancer  INTERVAL HISTORY:   Mr. Thang returns as scheduled. He reports feeling well. He stopped the Imodium and Lomotil for one week and reports the stool remain performed.  Objective:  Vital signs in last 24 hours:  Blood pressure 118/50, pulse 64, temperature 97.9 F (36.6 C), temperature source Oral, resp. rate 18, height '6\' 2"'$  (1.88 m), weight 187 lb 12.8 oz (85.186 kg), SpO2 98 %.    HEENT: Neck without mass Lymphatics: No cervical, supraclavicular, axillary, or inguinal nodes Resp: Lungs clear bilaterally Cardio: Regular rate and rhythm GI: No hepatomegaly, right mid abdomen ileostomy with yellow liquid Vascular: No leg edema   Portacath/PICC-without erythema  Lab Results:  Lab Results  Component Value Date   WBC 6.9 01/04/2016   HGB 11.2* 01/04/2016   HCT 35.3* 01/04/2016   MCV 78.5* 01/04/2016   PLT 140 01/04/2016   NEUTROABS 4.6 01/04/2016    Lab Results  Component Value Date   NA 139 01/04/2016    No results found for: CEA1  Imaging:  Ct Abdomen Pelvis W Contrast  01/04/2016  CLINICAL DATA:  Colon cancer. EXAM: CT ABDOMEN AND PELVIS WITH CONTRAST TECHNIQUE: Multidetector CT imaging of the abdomen and pelvis was performed using the standard protocol following bolus administration of intravenous contrast. CONTRAST:  119m ISOVUE-300 IOPAMIDOL (ISOVUE-300) INJECTION 61% COMPARISON:  10/19/2015. FINDINGS: Lower chest: Lung bases show minimal subsegmental atelectasis or scarring in the lower lobes. Heart size normal. No pericardial or pleural effusion. Hepatobiliary: Irregular heterogeneous masses in the liver have enlarged or are new in the interval. Index mass in the posterior dome of the liver measures 5.0 cm (previously 3.0 cm). Gallbladder is unremarkable. No biliary ductal dilatation. Pancreas: Proximal pancreatic duct is dilated, measuring 7 mm, similar. Pancreas is otherwise  unremarkable. Spleen: Negative. Adrenals/Urinary Tract: Adrenal glands are unremarkable. Right kidney appears non rotated. Kidneys are otherwise unremarkable. Bladder is grossly unremarkable. Stomach/Bowel: Stomach and majority of the small bowel are unremarkable. Right lower quadrant ileostomy. Subtotal colectomy. Remaining rectosigmoid colon is grossly unremarkable. Vascular/Lymphatic: Atherosclerotic calcification of the arterial vasculature without abdominal aortic aneurysm. Circumaortic left renal vein. No pathologically enlarged lymph nodes. Reproductive: Prostate is enlarged. Other: No free fluid.  Mesenteries and peritoneum are unremarkable. Musculoskeletal: No worrisome lytic or sclerotic lesions. Degenerative changes are seen in the spine. IMPRESSION: 1. New and enlarging hepatic metastases. 2. Pancreatic ductal dilatation, stable. 3. Enlarged prostate. Electronically Signed   By: MLorin PicketM.D.   On: 01/04/2016 13:34    Medications: I have reviewed the patient's current medications.  Assessment/Plan: 1. 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  Cycle 5 FOLFOX 08/20/2015 (oxaliplatin dose reduced)  Cycle 6 FOLFOX 09/11/2015  Cycle 7 FOLFOX 09/25/2015  Cycle 8 FOLFOX 10/09/2015 (oxaliplatin held due to thrombocytopenia)  Restaging CT abdomen and pelvis 10/19/2015 with some lesions  appearing smaller with the majority stable to slightly larger.  10/23/2015 discussed  FOLFIRI versus a treatment break. We mutually decided on a treatment break.  CT abdomen/pelvis 01/04/2016-new and enlarging liver lesions   2. Bleeding duodenal ulcer February 2014  3. History of heavy alcohol use  4. Enterobacter bacteremia following surgery August 2016  5. Microcytic anemia secondary to #1, taking ferrous sulfate-improved  6. CT chest 05/15/2015-negative for metastatic disease, fatty mass at the left chest wall-low-grade neoplasm not excluded  7. Port-A-Cath placement 06/20/2015  8. Admission with a partial small bowel dysfunction versus ileus 08/14/2015-improved with NG tube decompression  9. History of thrombocytopenia secondary to chemotherapy-improved  10. Oxaliplatin neuropathy    Disposition:  Harold Price appears well. I reviewed the CT images with him today. The CT confirmed progression of the metastatic colon cancer. We discussed treatment options. I recommend FOLFIRI chemotherapy. We reviewed the potential toxicities associated with this regimen including the chance for abdominal pain/diarrhea and alopecia with irinotecan. He agrees to proceed. [name] will return for a first treatment with FOLFIRI on 01/08/2016. He will resume Imodium/Lomotil if he develops loose stool output.  Betsy Coder, MD  01/04/2016  3:33 PM

## 2016-01-04 NOTE — Telephone Encounter (Signed)
Gave and printed appt sched and avs for pt for April and May °

## 2016-01-04 NOTE — Telephone Encounter (Signed)
Per staff message and POF I have scheduled appts. Advised scheduler of appts. JMW  

## 2016-01-04 NOTE — Patient Instructions (Signed)
Irinotecan injection  What is this medicine?  IRINOTECAN (ir in oh TEE kan ) is a chemotherapy drug. It is used to treat colon and rectal cancer.  This medicine may be used for other purposes; ask your health care provider or pharmacist if you have questions.  What should I tell my health care provider before I take this medicine?  They need to know if you have any of these conditions:  -blood disorders  -dehydration  -diarrhea  -infection (especially a virus infection such as chickenpox, cold sores, or herpes)  -liver disease  -low blood counts, like low white cell, platelet, or red cell counts  -recent or ongoing radiation therapy  -an unusual or allergic reaction to irinotecan, sorbitol, other chemotherapy, other medicines, foods, dyes, or preservatives  -pregnant or trying to get pregnant  -breast-feeding  How should I use this medicine?  This drug is given as an infusion into a vein. It is administered in a hospital or clinic by a specially trained health care professional.  Talk to your pediatrician regarding the use of this medicine in children. Special care may be needed.  Overdosage: If you think you have taken too much of this medicine contact a poison control center or emergency room at once.  NOTE: This medicine is only for you. Do not share this medicine with others.  What if I miss a dose?  It is important not to miss your dose. Call your doctor or health care professional if you are unable to keep an appointment.  What may interact with this medicine?  Do not take this medicine with any of the following medications:  -atazanavir  -certain medicines for fungal infections like itraconazole and ketoconazole  -St. John's Wort  This medicine may also interact with the following medications:  -dexamethasone  -diuretics  -laxatives  -medicines for seizures like carbamazepine, mephobarbital, phenobarbital, phenytoin, primidone  -medicines to increase blood counts like filgrastim, pegfilgrastim,  sargramostim  -prochlorperazine  -vaccines  This list may not describe all possible interactions. Give your health care provider a list of all the medicines, herbs, non-prescription drugs, or dietary supplements you use. Also tell them if you smoke, drink alcohol, or use illegal drugs. Some items may interact with your medicine.  What should I watch for while using this medicine?  Your condition will be monitored carefully while you are receiving this medicine. You will need important blood work done while you are taking this medicine.  This drug may make you feel generally unwell. This is not uncommon, as chemotherapy can affect healthy cells as well as cancer cells. Report any side effects. Continue your course of treatment even though you feel ill unless your doctor tells you to stop.  In some cases, you may be given additional medicines to help with side effects. Follow all directions for their use.  You may get drowsy or dizzy. Do not drive, use machinery, or do anything that needs mental alertness until you know how this medicine affects you. Do not stand or sit up quickly, especially if you are an older patient. This reduces the risk of dizzy or fainting spells.  Call your doctor or health care professional for advice if you get a fever, chills or sore throat, or other symptoms of a cold or flu. Do not treat yourself. This drug decreases your body's ability to fight infections. Try to avoid being around people who are sick.  This medicine may increase your risk to bruise or bleed. Call your doctor   or health care professional if you notice any unusual bleeding.  Be careful brushing and flossing your teeth or using a toothpick because you may get an infection or bleed more easily. If you have any dental work done, tell your dentist you are receiving this medicine.  Avoid taking products that contain aspirin, acetaminophen, ibuprofen, naproxen, or ketoprofen unless instructed by your doctor. These medicines may  hide a fever.  Do not become pregnant while taking this medicine. Women should inform their doctor if they wish to become pregnant or think they might be pregnant. There is a potential for serious side effects to an unborn child. Talk to your health care professional or pharmacist for more information. Do not breast-feed an infant while taking this medicine.  What side effects may I notice from receiving this medicine?  Side effects that you should report to your doctor or health care professional as soon as possible:  -allergic reactions like skin rash, itching or hives, swelling of the face, lips, or tongue  -low blood counts - this medicine may decrease the number of white blood cells, red blood cells and platelets. You may be at increased risk for infections and bleeding.  -signs of infection - fever or chills, cough, sore throat, pain or difficulty passing urine  -signs of decreased platelets or bleeding - bruising, pinpoint red spots on the skin, black, tarry stools, blood in the urine  -signs of decreased red blood cells - unusually weak or tired, fainting spells, lightheadedness  -breathing problems  -chest pain  -diarrhea  -feeling faint or lightheaded, falls  -flushing, runny nose, sweating during infusion  -mouth sores or pain  -pain, swelling, redness or irritation where injected  -pain, swelling, warmth in the leg  -pain, tingling, numbness in the hands or feet  -problems with balance, talking, walking  -stomach cramps, pain  -trouble passing urine or change in the amount of urine  -vomiting as to be unable to hold down drinks or food  -yellowing of the eyes or skin  Side effects that usually do not require medical attention (report to your doctor or health care professional if they continue or are bothersome):  -constipation  -hair loss  -headache  -loss of appetite  -nausea, vomiting  -stomach upset  This list may not describe all possible side effects. Call your doctor for medical advice about side  effects. You may report side effects to FDA at 1-800-FDA-1088.  Where should I keep my medicine?  This drug is given in a hospital or clinic and will not be stored at home.  NOTE: This sheet is a summary. It may not cover all possible information. If you have questions about this medicine, talk to your doctor, pharmacist, or health care provider.     © 2016, [employer]. (2013-02-28 16:29:32)

## 2016-01-05 LAB — CEA (PARALLEL TESTING): CEA: 28 ng/mL — AB

## 2016-01-05 LAB — CEA: CEA: 78.8 ng/mL — ABNORMAL HIGH (ref 0.0–4.7)

## 2016-01-06 ENCOUNTER — Other Ambulatory Visit: Payer: Self-pay | Admitting: Oncology

## 2016-01-07 ENCOUNTER — Other Ambulatory Visit: Payer: Self-pay | Admitting: *Deleted

## 2016-01-07 ENCOUNTER — Ambulatory Visit: Payer: Medicare Other | Admitting: Oncology

## 2016-01-07 ENCOUNTER — Other Ambulatory Visit: Payer: Medicare Other

## 2016-01-07 DIAGNOSIS — C189 Malignant neoplasm of colon, unspecified: Secondary | ICD-10-CM

## 2016-01-08 ENCOUNTER — Ambulatory Visit (HOSPITAL_BASED_OUTPATIENT_CLINIC_OR_DEPARTMENT_OTHER): Payer: Medicare Other

## 2016-01-08 ENCOUNTER — Encounter: Payer: Self-pay | Admitting: *Deleted

## 2016-01-08 ENCOUNTER — Other Ambulatory Visit: Payer: Self-pay

## 2016-01-08 ENCOUNTER — Other Ambulatory Visit (HOSPITAL_BASED_OUTPATIENT_CLINIC_OR_DEPARTMENT_OTHER): Payer: Medicare Other

## 2016-01-08 VITALS — BP 117/46 | HR 51 | Resp 18

## 2016-01-08 DIAGNOSIS — C189 Malignant neoplasm of colon, unspecified: Secondary | ICD-10-CM

## 2016-01-08 DIAGNOSIS — Z95828 Presence of other vascular implants and grafts: Secondary | ICD-10-CM | POA: Insufficient documentation

## 2016-01-08 DIAGNOSIS — C787 Secondary malignant neoplasm of liver and intrahepatic bile duct: Secondary | ICD-10-CM | POA: Diagnosis not present

## 2016-01-08 DIAGNOSIS — Z5111 Encounter for antineoplastic chemotherapy: Secondary | ICD-10-CM | POA: Diagnosis not present

## 2016-01-08 DIAGNOSIS — C182 Malignant neoplasm of ascending colon: Secondary | ICD-10-CM

## 2016-01-08 DIAGNOSIS — C180 Malignant neoplasm of cecum: Secondary | ICD-10-CM | POA: Diagnosis not present

## 2016-01-08 LAB — COMPREHENSIVE METABOLIC PANEL
ALBUMIN: 3.2 g/dL — AB (ref 3.5–5.0)
ALK PHOS: 190 U/L — AB (ref 40–150)
ALT: 19 U/L (ref 0–55)
AST: 45 U/L — ABNORMAL HIGH (ref 5–34)
Anion Gap: 6 mEq/L (ref 3–11)
BILIRUBIN TOTAL: 0.57 mg/dL (ref 0.20–1.20)
BUN: 12.1 mg/dL (ref 7.0–26.0)
CO2: 25 meq/L (ref 22–29)
CREATININE: 0.9 mg/dL (ref 0.7–1.3)
Calcium: 9.6 mg/dL (ref 8.4–10.4)
Chloride: 106 mEq/L (ref 98–109)
EGFR: >90 mL/min/{1.73_m2} (ref >90)
GLUCOSE: 90 mg/dL (ref 70–140)
Potassium: 4.1 mEq/L (ref 3.5–5.1)
SODIUM: 137 meq/L (ref 136–145)
TOTAL PROTEIN: 6.5 g/dL (ref 6.4–8.3)

## 2016-01-08 LAB — CBC WITH DIFFERENTIAL/PLATELET
BASO%: 0.4 % (ref 0.0–2.0)
Basophils Absolute: 0 10*3/uL (ref 0.0–0.1)
EOS ABS: 0.2 10*3/uL (ref 0.0–0.5)
EOS%: 3.1 % (ref 0.0–7.0)
HCT: 32.4 % — ABNORMAL LOW (ref 38.4–49.9)
HEMOGLOBIN: 10.2 g/dL — AB (ref 13.0–17.1)
LYMPH%: 18.7 % (ref 14.0–49.0)
MCH: 25 pg — ABNORMAL LOW (ref 27.2–33.4)
MCHC: 31.5 g/dL — ABNORMAL LOW (ref 32.0–36.0)
MCV: 79.3 fL (ref 79.3–98.0)
MONO#: 0.6 10*3/uL (ref 0.1–0.9)
MONO%: 9.2 % (ref 0.0–14.0)
NEUT%: 68.6 % (ref 39.0–75.0)
NEUTROS ABS: 4.2 10*3/uL (ref 1.5–6.5)
PLATELETS: 150 10*3/uL (ref 140–400)
RBC: 4.08 10*6/uL — AB (ref 4.20–5.82)
RDW: 15.2 % — AB (ref 11.0–14.6)
WBC: 6.1 10*3/uL (ref 4.0–10.3)
lymph#: 1.1 10*3/uL (ref 0.9–3.3)

## 2016-01-08 MED ORDER — ATROPINE SULFATE 1 MG/ML IJ SOLN
0.5000 mg | Freq: Once | INTRAMUSCULAR | Status: AC | PRN
  Administered 2016-01-08: 0.5 mg via INTRAVENOUS

## 2016-01-08 MED ORDER — PALONOSETRON HCL INJECTION 0.25 MG/5ML
INTRAVENOUS | Status: AC
  Filled 2016-01-08: qty 5

## 2016-01-08 MED ORDER — LEUCOVORIN CALCIUM INJECTION 350 MG
300.0000 mg/m2 | Freq: Once | INTRAVENOUS | Status: AC
  Administered 2016-01-08: 634 mg via INTRAVENOUS
  Filled 2016-01-08: qty 31.7

## 2016-01-08 MED ORDER — SODIUM CHLORIDE 0.9 % IV SOLN
1800.0000 mg/m2 | INTRAVENOUS | Status: DC
  Administered 2016-01-08: 3800 mg via INTRAVENOUS
  Filled 2016-01-08: qty 76

## 2016-01-08 MED ORDER — IRINOTECAN HCL CHEMO INJECTION 100 MG/5ML
133.0000 mg/m2 | Freq: Once | INTRAVENOUS | Status: AC
  Administered 2016-01-08: 280 mg via INTRAVENOUS
  Filled 2016-01-08: qty 10

## 2016-01-08 MED ORDER — PALONOSETRON HCL INJECTION 0.25 MG/5ML
0.2500 mg | Freq: Once | INTRAVENOUS | Status: AC
  Administered 2016-01-08: 0.25 mg via INTRAVENOUS

## 2016-01-08 MED ORDER — ATROPINE SULFATE 1 MG/ML IJ SOLN
INTRAMUSCULAR | Status: AC
  Filled 2016-01-08: qty 1

## 2016-01-08 MED ORDER — SODIUM CHLORIDE 0.9 % IV SOLN
Freq: Once | INTRAVENOUS | Status: AC
  Administered 2016-01-08: 13:00:00 via INTRAVENOUS

## 2016-01-08 MED ORDER — FLUOROURACIL CHEMO INJECTION 2.5 GM/50ML
300.0000 mg/m2 | Freq: Once | INTRAVENOUS | Status: AC
  Administered 2016-01-08: 650 mg via INTRAVENOUS
  Filled 2016-01-08: qty 13

## 2016-01-08 MED ORDER — DEXAMETHASONE SODIUM PHOSPHATE 100 MG/10ML IJ SOLN
10.0000 mg | Freq: Once | INTRAMUSCULAR | Status: AC
  Administered 2016-01-08: 10 mg via INTRAVENOUS
  Filled 2016-01-08: qty 1

## 2016-01-08 NOTE — Progress Notes (Signed)
Ok to treat with [facility] per MD Benay Spice

## 2016-01-08 NOTE — Patient Instructions (Signed)
[facility] Discharge Instructions for Patients Receiving Chemotherapy  Today you received the following chemotherapy agents: Leucovorin/5-Fu/ Irinotecan  To help prevent nausea and vomiting after your treatment, we encourage you to take your nausea medication as directed.    If you develop nausea and vomiting that is not controlled by your nausea medication, call the clinic.   BELOW ARE SYMPTOMS THAT SHOULD BE REPORTED IMMEDIATELY:  *FEVER GREATER THAN 100.5 F  *CHILLS WITH OR WITHOUT FEVER  NAUSEA AND VOMITING THAT IS NOT CONTROLLED WITH YOUR NAUSEA MEDICATION  *UNUSUAL SHORTNESS OF BREATH  *UNUSUAL BRUISING OR BLEEDING  TENDERNESS IN MOUTH AND THROAT WITH OR WITHOUT PRESENCE OF ULCERS  *URINARY PROBLEMS  *BOWEL PROBLEMS  UNUSUAL RASH Items with * indicate a potential emergency and should be followed up as soon as possible.  Feel free to call the clinic you have any questions or concerns. The clinic phone number is [phone].  Please show the [place] at check-in to the Emergency Department and triage nurse.

## 2016-01-08 NOTE — Progress Notes (Signed)
Oncology Nurse Navigator Documentation  Oncology Nurse Navigator Flowsheets 01/08/2016  Navigator Location [facility]  Navigator Encounter Type Treatment  Treatment Initiated Date 01/08/2016  Patient Visit Type MedOnc  Treatment Phase First Chemo Tx  Barriers/Navigation Needs Education  Education  Symptom Management--diarrhea and nausea   Interventions Education Method  Referrals -  Education Method Verbal;Teach-back  Support Groups/Services -  Specialty Items/DME -  Acuity Level 2  Time Spent with Patient 15  Met with patient during initial chemotherapy treatment to provide support and assess for needs to promote continuity of care 1. Diarrhea management as soon as stool is loose w/Imodium 2. Nausea management w/Compazine every 6 hours as needed Reports his ostomy bag stays on about 3-4 days now. No irritation around stoma according to [facility]. No difficult getting supplies. Provided direct contact # for navigator for him to call with problems.  Merceda Elks, RN, BSN GI Oncology [place]

## 2016-01-10 ENCOUNTER — Ambulatory Visit (HOSPITAL_BASED_OUTPATIENT_CLINIC_OR_DEPARTMENT_OTHER): Payer: Medicare Other

## 2016-01-10 VITALS — BP 120/59 | HR 60 | Temp 98.2°F | Resp 18 | Wt 190.8 lb

## 2016-01-10 DIAGNOSIS — C180 Malignant neoplasm of cecum: Secondary | ICD-10-CM

## 2016-01-10 DIAGNOSIS — C787 Secondary malignant neoplasm of liver and intrahepatic bile duct: Secondary | ICD-10-CM

## 2016-01-10 DIAGNOSIS — C189 Malignant neoplasm of colon, unspecified: Secondary | ICD-10-CM

## 2016-01-10 DIAGNOSIS — C182 Malignant neoplasm of ascending colon: Secondary | ICD-10-CM

## 2016-01-10 MED ORDER — HEPARIN SOD (PORK) LOCK FLUSH 100 UNIT/ML IV SOLN
500.0000 [IU] | Freq: Once | INTRAVENOUS | Status: AC | PRN
  Administered 2016-01-10: 500 [IU]
  Filled 2016-01-10: qty 5

## 2016-01-10 MED ORDER — SODIUM CHLORIDE 0.9% FLUSH
10.0000 mL | INTRAVENOUS | Status: DC | PRN
  Administered 2016-01-10: 10 mL
  Filled 2016-01-10: qty 10

## 2016-01-10 NOTE — Patient Instructions (Signed)

## 2016-01-19 ENCOUNTER — Other Ambulatory Visit: Payer: Self-pay | Admitting: Oncology

## 2016-01-22 ENCOUNTER — Ambulatory Visit: Payer: Medicare Other

## 2016-01-22 ENCOUNTER — Other Ambulatory Visit: Payer: Medicare Other

## 2016-01-22 ENCOUNTER — Telehealth: Payer: Self-pay | Admitting: *Deleted

## 2016-01-22 ENCOUNTER — Ambulatory Visit: Payer: Medicare Other | Admitting: Nurse Practitioner

## 2016-01-22 NOTE — Telephone Encounter (Signed)
  Oncology Nurse Navigator Documentation  Navigator Location: [facility] (01/22/16 1709) Navigator Encounter Type: Telephone (01/22/16 1709) Telephone: Outgoing Call (01/22/16 1709)  Mister was a [place] today. Left VM on home machine for him to call back with status update and to reschedule. Left navigator's direct contact # for him.

## 2016-01-25 ENCOUNTER — Telehealth: Payer: Self-pay | Admitting: *Deleted

## 2016-01-25 NOTE — Telephone Encounter (Signed)
  Oncology Nurse Navigator Documentation  Navigator Location: [facility] (01/25/16 1743) Navigator Encounter Type: Telephone (01/25/16 1743) Telephone: Outgoing Call;Patient Update (01/25/16 1743)    Harold Price to f/u on status. 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 He knows he missed his appointment and says he called office and has not heard from anyone yet.          Barriers/Navigation Needs: Coordination of Care (01/25/16 1743)   Needs chemo rescheduled and he prefers Tuesday.   Interventions: Coordination of Care (01/25/16 1743): Scheduled his lab and OV for 5/16 and sent in basket to chemo scheduler to get his chemo scheduled. Will call him Monday w/appointments.   Coordination of Care: Appts (01/25/16 1743)                  Time Spent with Patient: 15 (01/25/16 1743)

## 2016-01-28 ENCOUNTER — Telehealth: Payer: Self-pay | Admitting: *Deleted

## 2016-01-28 NOTE — Telephone Encounter (Signed)
Per staff message  I have scheduled appts. Advised navigator of appts. JMW

## 2016-01-28 NOTE — Telephone Encounter (Signed)
Oncology Nurse Navigator Documentation  Oncology Nurse Navigator Flowsheets 01/28/2016  Navigator Location [facility]  Navigator Encounter Type Telephone  Telephone Outgoing Call;Appt Confirmation/Clarification  Abnormal Finding Date -  Confirmed Diagnosis Date -  Surgery Date -  Treatment Initiated Date -  Patient Visit Type -  Treatment Phase -  Barriers/Navigation Needs -Notified Harold Price that his l/ov/chemo is scheduled for tomorrow at 0915-he understands and agrees.  Education -  Interventions -  Coordination of Care -  Education Method -  Support Groups/Services -  Specialty Items/DME -  Acuity -  Time Spent with Patient 15

## 2016-01-29 ENCOUNTER — Telehealth: Payer: Self-pay | Admitting: *Deleted

## 2016-01-29 ENCOUNTER — Encounter: Payer: Self-pay | Admitting: *Deleted

## 2016-01-29 ENCOUNTER — Ambulatory Visit (HOSPITAL_BASED_OUTPATIENT_CLINIC_OR_DEPARTMENT_OTHER): Payer: Medicare Other | Admitting: Nurse Practitioner

## 2016-01-29 ENCOUNTER — Ambulatory Visit (HOSPITAL_BASED_OUTPATIENT_CLINIC_OR_DEPARTMENT_OTHER): Payer: Medicare Other

## 2016-01-29 ENCOUNTER — Telehealth: Payer: Self-pay | Admitting: Nurse Practitioner

## 2016-01-29 ENCOUNTER — Other Ambulatory Visit (HOSPITAL_BASED_OUTPATIENT_CLINIC_OR_DEPARTMENT_OTHER): Payer: Medicare Other

## 2016-01-29 VITALS — BP 129/46 | HR 62 | Temp 97.7°F | Resp 17 | Ht 74.0 in | Wt 183.6 lb

## 2016-01-29 VITALS — BP 98/55 | HR 57 | Temp 97.5°F | Resp 18

## 2016-01-29 DIAGNOSIS — Z5111 Encounter for antineoplastic chemotherapy: Secondary | ICD-10-CM | POA: Diagnosis present

## 2016-01-29 DIAGNOSIS — C180 Malignant neoplasm of cecum: Secondary | ICD-10-CM | POA: Diagnosis present

## 2016-01-29 DIAGNOSIS — C189 Malignant neoplasm of colon, unspecified: Secondary | ICD-10-CM

## 2016-01-29 DIAGNOSIS — D709 Neutropenia, unspecified: Secondary | ICD-10-CM | POA: Diagnosis not present

## 2016-01-29 DIAGNOSIS — C787 Secondary malignant neoplasm of liver and intrahepatic bile duct: Secondary | ICD-10-CM | POA: Diagnosis not present

## 2016-01-29 DIAGNOSIS — G620 Drug-induced polyneuropathy: Secondary | ICD-10-CM | POA: Diagnosis not present

## 2016-01-29 DIAGNOSIS — C182 Malignant neoplasm of ascending colon: Secondary | ICD-10-CM

## 2016-01-29 LAB — CBC WITH DIFFERENTIAL/PLATELET
BASO%: 0.8 % (ref 0.0–2.0)
Basophils Absolute: 0 10*3/uL (ref 0.0–0.1)
EOS%: 3.9 % (ref 0.0–7.0)
Eosinophils Absolute: 0.1 10*3/uL (ref 0.0–0.5)
HEMATOCRIT: 33 % — AB (ref 38.4–49.9)
HEMOGLOBIN: 10.3 g/dL — AB (ref 13.0–17.1)
LYMPH#: 0.8 10*3/uL — AB (ref 0.9–3.3)
LYMPH%: 30.1 % (ref 14.0–49.0)
MCH: 24.4 pg — ABNORMAL LOW (ref 27.2–33.4)
MCHC: 31.1 g/dL — ABNORMAL LOW (ref 32.0–36.0)
MCV: 78.5 fL — ABNORMAL LOW (ref 79.3–98.0)
MONO#: 0.5 10*3/uL (ref 0.1–0.9)
MONO%: 19.3 % — AB (ref 0.0–14.0)
NEUT%: 45.9 % (ref 39.0–75.0)
NEUTROS ABS: 1.2 10*3/uL — AB (ref 1.5–6.5)
Platelets: 165 10*3/uL (ref 140–400)
RBC: 4.21 10*6/uL (ref 4.20–5.82)
RDW: 16.3 % — AB (ref 11.0–14.6)
WBC: 2.7 10*3/uL — AB (ref 4.0–10.3)

## 2016-01-29 LAB — COMPREHENSIVE METABOLIC PANEL
ALBUMIN: 3.3 g/dL — AB (ref 3.5–5.0)
ALK PHOS: 177 U/L — AB (ref 40–150)
ALT: 12 U/L (ref 0–55)
AST: 31 U/L (ref 5–34)
Anion Gap: 6 mEq/L (ref 3–11)
BILIRUBIN TOTAL: 0.48 mg/dL (ref 0.20–1.20)
BUN: 9.1 mg/dL (ref 7.0–26.0)
CALCIUM: 9.6 mg/dL (ref 8.4–10.4)
CO2: 26 mEq/L (ref 22–29)
Chloride: 104 mEq/L (ref 98–109)
Creatinine: 0.9 mg/dL (ref 0.7–1.3)
EGFR: 87 mL/min/{1.73_m2} — ABNORMAL LOW (ref >90)
Glucose: 104 mg/dl (ref 70–140)
POTASSIUM: 4 meq/L (ref 3.5–5.1)
SODIUM: 137 meq/L (ref 136–145)
TOTAL PROTEIN: 6.8 g/dL (ref 6.4–8.3)

## 2016-01-29 MED ORDER — SODIUM CHLORIDE 0.9 % IV SOLN
1800.0000 mg/m2 | INTRAVENOUS | Status: DC
  Administered 2016-01-29: 3800 mg via INTRAVENOUS
  Filled 2016-01-29: qty 76

## 2016-01-29 MED ORDER — SODIUM CHLORIDE 0.9 % IV SOLN
10.0000 mg | Freq: Once | INTRAVENOUS | Status: AC
  Administered 2016-01-29: 10 mg via INTRAVENOUS
  Filled 2016-01-29: qty 1

## 2016-01-29 MED ORDER — PALONOSETRON HCL INJECTION 0.25 MG/5ML
INTRAVENOUS | Status: AC
  Filled 2016-01-29: qty 5

## 2016-01-29 MED ORDER — LEUCOVORIN CALCIUM INJECTION 350 MG
300.0000 mg/m2 | Freq: Once | INTRAMUSCULAR | Status: AC
  Administered 2016-01-29: 634 mg via INTRAVENOUS
  Filled 2016-01-29: qty 31.7

## 2016-01-29 MED ORDER — SODIUM CHLORIDE 0.9 % IV SOLN
Freq: Once | INTRAVENOUS | Status: AC
  Administered 2016-01-29: 11:00:00 via INTRAVENOUS

## 2016-01-29 MED ORDER — FLUOROURACIL CHEMO INJECTION 2.5 GM/50ML
300.0000 mg/m2 | Freq: Once | INTRAVENOUS | Status: AC
  Administered 2016-01-29: 650 mg via INTRAVENOUS
  Filled 2016-01-29: qty 13

## 2016-01-29 MED ORDER — HEPARIN SOD (PORK) LOCK FLUSH 100 UNIT/ML IV SOLN
500.0000 [IU] | Freq: Once | INTRAVENOUS | Status: DC | PRN
  Filled 2016-01-29: qty 5

## 2016-01-29 MED ORDER — DIPHENOXYLATE-ATROPINE 2.5-0.025 MG PO TABS: 1.0000 | ORAL_TABLET | Freq: Two times a day (BID) | ORAL | Status: DC

## 2016-01-29 MED ORDER — ATROPINE SULFATE 1 MG/ML IJ SOLN
INTRAMUSCULAR | Status: AC
  Filled 2016-01-29: qty 1

## 2016-01-29 MED ORDER — PALONOSETRON HCL INJECTION 0.25 MG/5ML
0.2500 mg | Freq: Once | INTRAVENOUS | Status: AC
  Administered 2016-01-29: 0.25 mg via INTRAVENOUS

## 2016-01-29 MED ORDER — ATROPINE SULFATE 1 MG/ML IJ SOLN
0.5000 mg | Freq: Once | INTRAMUSCULAR | Status: AC | PRN
  Administered 2016-01-29: 0.5 mg via INTRAVENOUS

## 2016-01-29 MED ORDER — IRINOTECAN HCL CHEMO INJECTION 100 MG/5ML
132.5000 mg/m2 | Freq: Once | INTRAVENOUS | Status: AC
  Administered 2016-01-29: 280 mg via INTRAVENOUS
  Filled 2016-01-29: qty 10

## 2016-01-29 MED ORDER — SODIUM CHLORIDE 0.9% FLUSH
10.0000 mL | INTRAVENOUS | Status: DC | PRN
  Filled 2016-01-29: qty 10

## 2016-01-29 MED FILL — DIPHENOXYLATE-ATROPINE TAB: 2.5-0.025 | 90 days supply | Qty: 180 | Fill #0

## 2016-01-29 NOTE — Progress Notes (Signed)
  [facility] OFFICE PROGRESS NOTE   Diagnosis:  Colon cancer  INTERVAL HISTORY:   Harold Price returns after missing a follow-up visit last week. He completed cycle 1 FOLFIRI 01/08/2016. He denies nausea/vomiting. No mouth sores. No significant diarrhea. He actually notes that his stools are "thicker". He adjusts Imodium and Lomotil according to consistency of stool.  Objective:  Vital signs in last 24 hours:  Blood pressure 129/46, pulse 62, temperature 97.7 F (36.5 C), resp. rate 17, height _0  (1.88 m), weight 183 lb 9.6 oz (83.28 kg), SpO2 100 %.    HEENT: No thrush or ulcers. Resp: Lungs clear bilaterally. Cardio: Regular rate and rhythm. GI: Abdomen soft and nontender. Question palpable liver edge. Right mid abdomen ileostomy with thick stool in the collection bag. Vascular: No leg edema. Port-A-Cath without erythema. Lab Results:  Lab Results  Component Value Date   WBC 2.7* 01/29/2016   HGB 10.3* 01/29/2016   HCT 33.0* 01/29/2016   MCV 78.5* 01/29/2016   PLT 165 01/29/2016   NEUTROABS 1.2* 01/29/2016    Imaging:  No results found.  Medications: I have reviewed the patient's current medications.  Assessment/Plan: 1. 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  Cycle 5 FOLFOX 08/20/2015 (oxaliplatin dose reduced)  Cycle 6 FOLFOX 09/11/2015  Cycle 7  FOLFOX 09/25/2015  Cycle 8 FOLFOX 10/09/2015 (oxaliplatin held due to thrombocytopenia)  Restaging CT abdomen and pelvis 10/19/2015 with some lesions appearing smaller with the majority stable to slightly larger.  10/23/2015 discussed FOLFIRI versus a treatment break. We mutually decided on a treatment break.  CT abdomen/pelvis 01/04/2016-new and enlarging liver lesions  Cycle 1 FOLFIRI 01/08/2016  Cycle 2 FOLFIRI 01/29/2016 with Neulasta support   2. Bleeding duodenal ulcer February 2014  3. History of heavy alcohol use  4. Enterobacter bacteremia following surgery August 2016  5. Microcytic anemia secondary to #1, taking ferrous sulfate-improved  6. CT chest 05/15/2015-negative for metastatic disease, fatty mass at the left chest wall-low-grade neoplasm not excluded  7. Port-A-Cath placement 06/20/2015  8. Admission with a partial small bowel dysfunction versus ileus 08/14/2015-improved with NG tube decompression  9. 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 He has completed 1 cycle of FOLFIRI. Plan to proceed with cycle 2 today as scheduled. He has mild neutropenia on labs today. He will receive Neulasta on the day of pump discontinuation. We reviewed potential toxicities associated with Neulasta including bone pain, rash, splenic rupture. He is agreeable to proceed. He understands to contact the office with fever, chills, other signs of infection.  He will return for a follow-up visit and cycle 3 FOLFIRI in 2 weeks. He will contact the office in the interim as outlined above or with any other problems.  Plan reviewed with Dr. Benay Spice.    Ned Card ANP/GNP-BC   01/29/2016  10:25 AM

## 2016-01-29 NOTE — Telephone Encounter (Signed)
Per staff message and POF I have scheduled appts. Advised scheduler of appts. JMW  

## 2016-01-29 NOTE — Telephone Encounter (Signed)
per pof tos ch pt appt-sent MW email to sch trmt-will call pt after reply °

## 2016-01-29 NOTE — Patient Instructions (Addendum)
[facility] Discharge Instructions for Patients Receiving Chemotherapy  Today you received the following chemotherapy agents: Leucovorin/5-Fu/ Irinotecan  To help prevent nausea and vomiting after your treatment, we encourage you to take your nausea medication as directed.    If you develop nausea and vomiting that is not controlled by your nausea medication, call the clinic.   BELOW ARE SYMPTOMS THAT SHOULD BE REPORTED IMMEDIATELY:  *FEVER GREATER THAN 100.5 F  *CHILLS WITH OR WITHOUT FEVER  NAUSEA AND VOMITING THAT IS NOT CONTROLLED WITH YOUR NAUSEA MEDICATION  *UNUSUAL SHORTNESS OF BREATH  *UNUSUAL BRUISING OR BLEEDING  TENDERNESS IN MOUTH AND THROAT WITH OR WITHOUT PRESENCE OF ULCERS  *URINARY PROBLEMS  *BOWEL PROBLEMS  UNUSUAL RASH Items with * indicate a potential emergency and should be followed up as soon as possible.  Feel free to call the clinic you have any questions or concerns. The clinic phone number is [phone].  Please show the [place] at check-in to the Emergency Department and triage nurse.  [place] Discharge Instructions for Patients Receiving Chemotherapy  Today you received the following chemotherapy agents: Leucovorin, 5-Fu, Oxaliplatin.  To help prevent nausea and vomiting after your treatment, we encourage you to take your nausea medication.    If you develop nausea and vomiting that is not controlled by your nausea medication, call the clinic.   BELOW ARE SYMPTOMS THAT SHOULD BE REPORTED IMMEDIATELY:  *FEVER GREATER THAN 100.5 F  *CHILLS WITH OR WITHOUT FEVER  NAUSEA AND VOMITING THAT IS NOT CONTROLLED WITH YOUR NAUSEA MEDICATION  *UNUSUAL SHORTNESS OF BREATH  *UNUSUAL BRUISING OR BLEEDING  TENDERNESS IN MOUTH AND THROAT WITH OR WITHOUT PRESENCE OF ULCERS  *URINARY PROBLEMS  *BOWEL PROBLEMS  UNUSUAL RASH Items with * indicate a potential emergency and should be followed up as soon as  possible.  Feel free to call the clinic you have any questions or concerns. The clinic phone number is [phone].  Please show the [place] at check-in to the Emergency Department and triage nurse.

## 2016-01-29 NOTE — Progress Notes (Signed)
Oncology Nurse Navigator Documentation  Oncology Nurse Navigator Flowsheets 01/29/2016  Navigator Location [facility]  Navigator Encounter Type Treatment  Telephone -  Abnormal Finding Date -  Confirmed Diagnosis Date -  Surgery Date -  Treatment Initiated Date -  Patient Visit Type MedOnc  Treatment Phase Active Tx  Barriers/Navigation Needs Education--Neulasta and neutropenic precautions  Education Pain/ Symptom Management  Interventions Education Method  Coordination of Care -  Education Method Verbal;Teach-back  Support Groups/Services -  Specialty Items/DME -  Acuity -  Time Spent with Patient 30  Reports his stool in ostomy is thicker with use of Imodium and Lomotil. Orders supplies from [facility] with out of pocket cost about $30/month. One piece system-lasts 3-4 days before needing to change. 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

## 2016-01-29 NOTE — Progress Notes (Signed)
Called in Lomotil rx to [employer]

## 2016-01-29 NOTE — Progress Notes (Signed)
Per Merceda Elks, GI Navigator, okay to tx per Dr. Benay Spice with [place] 1.2

## 2016-01-31 ENCOUNTER — Ambulatory Visit (HOSPITAL_BASED_OUTPATIENT_CLINIC_OR_DEPARTMENT_OTHER): Payer: Medicare Other

## 2016-01-31 ENCOUNTER — Ambulatory Visit: Payer: Medicare Other

## 2016-01-31 VITALS — BP 111/54 | HR 76 | Temp 97.6°F | Resp 16

## 2016-01-31 DIAGNOSIS — C787 Secondary malignant neoplasm of liver and intrahepatic bile duct: Secondary | ICD-10-CM

## 2016-01-31 DIAGNOSIS — D709 Neutropenia, unspecified: Secondary | ICD-10-CM

## 2016-01-31 DIAGNOSIS — C180 Malignant neoplasm of cecum: Secondary | ICD-10-CM

## 2016-01-31 DIAGNOSIS — C182 Malignant neoplasm of ascending colon: Secondary | ICD-10-CM

## 2016-01-31 DIAGNOSIS — C189 Malignant neoplasm of colon, unspecified: Secondary | ICD-10-CM

## 2016-01-31 MED ORDER — SODIUM CHLORIDE 0.9% FLUSH
10.0000 mL | INTRAVENOUS | Status: DC | PRN
  Administered 2016-01-31: 10 mL
  Filled 2016-01-31: qty 10

## 2016-01-31 MED ORDER — HEPARIN SOD (PORK) LOCK FLUSH 100 UNIT/ML IV SOLN
500.0000 [IU] | Freq: Once | INTRAVENOUS | Status: AC | PRN
  Administered 2016-01-31: 500 [IU]
  Filled 2016-01-31: qty 5

## 2016-01-31 MED ORDER — PEGFILGRASTIM INJECTION 6 MG/0.6ML ~~LOC~~
6.0000 mg | PREFILLED_SYRINGE | Freq: Once | SUBCUTANEOUS | Status: AC
  Administered 2016-01-31: 6 mg via SUBCUTANEOUS
  Filled 2016-01-31: qty 0.6

## 2016-02-10 ENCOUNTER — Other Ambulatory Visit: Payer: Self-pay | Admitting: Oncology

## 2016-02-12 ENCOUNTER — Other Ambulatory Visit (HOSPITAL_BASED_OUTPATIENT_CLINIC_OR_DEPARTMENT_OTHER): Payer: Medicare Other

## 2016-02-12 ENCOUNTER — Telehealth: Payer: Self-pay | Admitting: Oncology

## 2016-02-12 ENCOUNTER — Ambulatory Visit (HOSPITAL_BASED_OUTPATIENT_CLINIC_OR_DEPARTMENT_OTHER): Payer: Medicare Other | Admitting: Oncology

## 2016-02-12 ENCOUNTER — Encounter: Payer: Self-pay | Admitting: Oncology

## 2016-02-12 VITALS — BP 122/48 | HR 89 | Temp 97.7°F | Resp 18 | Ht 74.0 in | Wt 180.3 lb

## 2016-02-12 DIAGNOSIS — D6959 Other secondary thrombocytopenia: Secondary | ICD-10-CM | POA: Diagnosis not present

## 2016-02-12 DIAGNOSIS — D409 Neoplasm of uncertain behavior of male genital organ, unspecified: Secondary | ICD-10-CM

## 2016-02-12 DIAGNOSIS — C180 Malignant neoplasm of cecum: Secondary | ICD-10-CM

## 2016-02-12 DIAGNOSIS — C189 Malignant neoplasm of colon, unspecified: Secondary | ICD-10-CM

## 2016-02-12 DIAGNOSIS — C787 Secondary malignant neoplasm of liver and intrahepatic bile duct: Secondary | ICD-10-CM

## 2016-02-12 DIAGNOSIS — D709 Neutropenia, unspecified: Secondary | ICD-10-CM

## 2016-02-12 DIAGNOSIS — Z5111 Encounter for antineoplastic chemotherapy: Secondary | ICD-10-CM | POA: Insufficient documentation

## 2016-02-12 LAB — CBC WITH DIFFERENTIAL/PLATELET
BASO%: 0.2 % (ref 0.0–2.0)
Basophils Absolute: 0 10*3/uL (ref 0.0–0.1)
EOS%: 1.9 % (ref 0.0–7.0)
Eosinophils Absolute: 0.2 10*3/uL (ref 0.0–0.5)
HEMATOCRIT: 32.9 % — AB (ref 38.4–49.9)
HGB: 10.4 g/dL — ABNORMAL LOW (ref 13.0–17.1)
LYMPH#: 1.4 10*3/uL (ref 0.9–3.3)
LYMPH%: 15.8 % (ref 14.0–49.0)
MCH: 24.7 pg — ABNORMAL LOW (ref 27.2–33.4)
MCHC: 31.7 g/dL — AB (ref 32.0–36.0)
MCV: 77.9 fL — ABNORMAL LOW (ref 79.3–98.0)
MONO#: 0.8 10*3/uL (ref 0.1–0.9)
MONO%: 8.8 % (ref 0.0–14.0)
NEUT%: 73.3 % (ref 39.0–75.0)
NEUTROS ABS: 6.6 10*3/uL — AB (ref 1.5–6.5)
Platelets: 164 10*3/uL (ref 140–400)
RBC: 4.23 10*6/uL (ref 4.20–5.82)
RDW: 17.4 % — AB (ref 11.0–14.6)
WBC: 8.9 10*3/uL (ref 4.0–10.3)

## 2016-02-12 LAB — COMPREHENSIVE METABOLIC PANEL
AST: 30 U/L (ref 5–34)
Albumin: 3.5 g/dL (ref 3.5–5.0)
Alkaline Phosphatase: 194 U/L — ABNORMAL HIGH (ref 40–150)
Anion Gap: 6 mEq/L (ref 3–11)
BUN: 7.4 mg/dL (ref 7.0–26.0)
CALCIUM: 9.5 mg/dL (ref 8.4–10.4)
CHLORIDE: 104 meq/L (ref 98–109)
CO2: 28 meq/L (ref 22–29)
CREATININE: 1 mg/dL (ref 0.7–1.3)
EGFR: 83 mL/min/{1.73_m2} — ABNORMAL LOW (ref >90)
Glucose: 91 mg/dl (ref 70–140)
Potassium: 4.6 mEq/L (ref 3.5–5.1)
Sodium: 138 mEq/L (ref 136–145)
TOTAL PROTEIN: 6.5 g/dL (ref 6.4–8.3)
Total Bilirubin: 0.56 mg/dL (ref 0.20–1.20)

## 2016-02-12 NOTE — Progress Notes (Signed)
  [facility] OFFICE PROGRESS NOTE   Diagnosis:  Colon cancer  INTERVAL HISTORY:   Harold Price returns after missing a follow-up visit last week. He completed cycle 2 FOLFIRI 01/29/2016. He denies nausea/vomiting. No mouth sores. No significant diarrhea. He actually notes that his stools are "thicker". He adjusts Imodium and Lomotil according to consistency of stool.  Objective:  Vital signs in last 24 hours:  Blood pressure 122/48, pulse 89, temperature 97.7 F (36.5 C), temperature source Oral, resp. rate 18, height 6' 2" (1.88 m), weight 180 lb 4.8 oz (81.784 kg), SpO2 99 %.    HEENT: No thrush or ulcers. Resp: Lungs clear bilaterally. Cardio: Regular rate and rhythm. GI: Abdomen soft and nontender. Question palpable liver edge. Right mid abdomen ileostomy with thick stool in the collection bag. Vascular: No leg edema. Port-A-Cath without erythema.  Lab Results:  Lab Results  Component Value Date   WBC 8.9 02/12/2016   HGB 10.4* 02/12/2016   HCT 32.9* 02/12/2016   MCV 77.9* 02/12/2016   PLT 164 02/12/2016   NEUTROABS 6.6* 02/12/2016    Imaging:  No results found.  Medications: I have reviewed the patient's current medications.  Assessment/Plan: 1. 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  Cycle 5 FOLFOX 08/20/2015 (oxaliplatin dose reduced)  Cycle 6  FOLFOX 09/11/2015  Cycle 7 FOLFOX 09/25/2015  Cycle 8 FOLFOX 10/09/2015 (oxaliplatin held due to thrombocytopenia)  Restaging CT abdomen and pelvis 10/19/2015 with some lesions appearing smaller with the majority stable to slightly larger.  10/23/2015 discussed FOLFIRI versus a treatment break. We mutually decided on a treatment break.  CT abdomen/pelvis 01/04/2016-new and enlarging liver lesions  Cycle 1 FOLFIRI 01/08/2016  Cycle 2 FOLFIRI 01/29/2016 with Neulasta support  Cycle 3 FOLFIRI 02/13/2016 with Neulasta support   2. Bleeding duodenal ulcer February 2014  3. History of heavy alcohol use  4. Enterobacter bacteremia following surgery August 2016  5. Microcytic anemia secondary to #1, taking ferrous sulfate-improved  6. CT chest 05/15/2015-negative for metastatic disease, fatty mass at the left chest wall-low-grade neoplasm not excluded  7. Port-A-Cath placement 06/20/2015  8. Admission with a partial small bowel dysfunction versus ileus 08/14/2015-improved with NG tube decompression  9. 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 He has completed 2 cycles of FOLFIRI. Plan to proceed with cycle 3 tomorrow as scheduled. Neutropenia has improved with Neulasta. He will continue to receive Neulasta on the day of pump discontinuation.   He will return for a follow-up visit and cycle 4 FOLFIRI in 2 weeks. He will contact the office in the interim as outlined above or with any other problems.  Plan reviewed with Dr. Benay Spice.    Harold Bussing, DNP, AGPCNP-BC, AOCNP  02/12/2016  11:40 AM

## 2016-02-12 NOTE — Telephone Encounter (Signed)
Gave and printed appt sched and avs for pt for May and June  °

## 2016-02-13 ENCOUNTER — Ambulatory Visit (HOSPITAL_BASED_OUTPATIENT_CLINIC_OR_DEPARTMENT_OTHER): Payer: Medicare Other

## 2016-02-13 VITALS — BP 126/56 | HR 60 | Temp 98.2°F | Resp 16

## 2016-02-13 DIAGNOSIS — C182 Malignant neoplasm of ascending colon: Secondary | ICD-10-CM

## 2016-02-13 DIAGNOSIS — C787 Secondary malignant neoplasm of liver and intrahepatic bile duct: Secondary | ICD-10-CM

## 2016-02-13 DIAGNOSIS — Z5111 Encounter for antineoplastic chemotherapy: Secondary | ICD-10-CM

## 2016-02-13 DIAGNOSIS — C180 Malignant neoplasm of cecum: Secondary | ICD-10-CM

## 2016-02-13 DIAGNOSIS — C189 Malignant neoplasm of colon, unspecified: Secondary | ICD-10-CM

## 2016-02-13 MED ORDER — PALONOSETRON HCL INJECTION 0.25 MG/5ML
INTRAVENOUS | Status: AC
  Filled 2016-02-13: qty 5

## 2016-02-13 MED ORDER — SODIUM CHLORIDE 0.9 % IV SOLN
1800.0000 mg/m2 | INTRAVENOUS | Status: DC
  Administered 2016-02-13: 3800 mg via INTRAVENOUS
  Filled 2016-02-13: qty 76

## 2016-02-13 MED ORDER — ATROPINE SULFATE 1 MG/ML IJ SOLN: 0.5000 mg | Freq: Once | INTRAMUSCULAR | Status: DC | PRN

## 2016-02-13 MED ORDER — LEUCOVORIN CALCIUM INJECTION 350 MG
300.0000 mg/m2 | Freq: Once | INTRAMUSCULAR | Status: AC
  Administered 2016-02-13: 634 mg via INTRAVENOUS
  Filled 2016-02-13: qty 31.7

## 2016-02-13 MED ORDER — IRINOTECAN HCL CHEMO INJECTION 100 MG/5ML
133.0000 mg/m2 | Freq: Once | INTRAVENOUS | Status: AC
  Administered 2016-02-13: 280 mg via INTRAVENOUS
  Filled 2016-02-13: qty 10

## 2016-02-13 MED ORDER — FLUOROURACIL CHEMO INJECTION 2.5 GM/50ML
300.0000 mg/m2 | Freq: Once | INTRAVENOUS | Status: AC
  Administered 2016-02-13: 650 mg via INTRAVENOUS
  Filled 2016-02-13: qty 13

## 2016-02-13 MED ORDER — SODIUM CHLORIDE 0.9 % IV SOLN
Freq: Once | INTRAVENOUS | Status: AC
  Administered 2016-02-13: 14:00:00 via INTRAVENOUS

## 2016-02-13 MED ORDER — SODIUM CHLORIDE 0.9 % IV SOLN
10.0000 mg | Freq: Once | INTRAVENOUS | Status: AC
  Administered 2016-02-13: 10 mg via INTRAVENOUS
  Filled 2016-02-13: qty 1

## 2016-02-13 MED ORDER — PALONOSETRON HCL INJECTION 0.25 MG/5ML
0.2500 mg | Freq: Once | INTRAVENOUS | Status: AC
  Administered 2016-02-13: 0.25 mg via INTRAVENOUS

## 2016-02-13 MED ORDER — ATROPINE SULFATE 1 MG/ML IJ SOLN
INTRAMUSCULAR | Status: AC
  Filled 2016-02-13: qty 1

## 2016-02-13 NOTE — Patient Instructions (Signed)
[place] Discharge Instructions for Patients Receiving Chemotherapy  Today you received the following chemotherapy agents:  5FU, Irinotecan, and Leucovorin.  To help prevent nausea and vomiting after your treatment, we encourage you to take your nausea medication as prescribed.   If you develop nausea and vomiting that is not controlled by your nausea medication, call the clinic.   BELOW ARE SYMPTOMS THAT SHOULD BE REPORTED IMMEDIATELY:  *FEVER GREATER THAN 100.5 F  *CHILLS WITH OR WITHOUT FEVER  NAUSEA AND VOMITING THAT IS NOT CONTROLLED WITH YOUR NAUSEA MEDICATION  *UNUSUAL SHORTNESS OF BREATH  *UNUSUAL BRUISING OR BLEEDING  TENDERNESS IN MOUTH AND THROAT WITH OR WITHOUT PRESENCE OF ULCERS  *URINARY PROBLEMS  *BOWEL PROBLEMS  UNUSUAL RASH Items with * indicate a potential emergency and should be followed up as soon as possible.  Feel free to call the clinic you have any questions or concerns. The clinic phone number is [phone].  Please show the [place] at check-in to the Emergency Department and triage nurse.

## 2016-02-14 ENCOUNTER — Ambulatory Visit: Payer: Medicare Other

## 2016-02-15 ENCOUNTER — Ambulatory Visit: Payer: Medicare Other

## 2016-02-15 ENCOUNTER — Ambulatory Visit (HOSPITAL_BASED_OUTPATIENT_CLINIC_OR_DEPARTMENT_OTHER): Payer: Medicare Other

## 2016-02-15 VITALS — BP 105/45 | HR 66 | Temp 98.0°F | Resp 16

## 2016-02-15 DIAGNOSIS — Z5189 Encounter for other specified aftercare: Secondary | ICD-10-CM | POA: Diagnosis not present

## 2016-02-15 DIAGNOSIS — C787 Secondary malignant neoplasm of liver and intrahepatic bile duct: Secondary | ICD-10-CM | POA: Diagnosis not present

## 2016-02-15 DIAGNOSIS — C180 Malignant neoplasm of cecum: Secondary | ICD-10-CM | POA: Diagnosis present

## 2016-02-15 DIAGNOSIS — C189 Malignant neoplasm of colon, unspecified: Secondary | ICD-10-CM

## 2016-02-15 DIAGNOSIS — C182 Malignant neoplasm of ascending colon: Secondary | ICD-10-CM

## 2016-02-15 MED ORDER — PEGFILGRASTIM INJECTION 6 MG/0.6ML ~~LOC~~
6.0000 mg | PREFILLED_SYRINGE | Freq: Once | SUBCUTANEOUS | Status: AC
  Administered 2016-02-15: 6 mg via SUBCUTANEOUS
  Filled 2016-02-15: qty 0.6

## 2016-02-15 MED ORDER — SODIUM CHLORIDE 0.9% FLUSH
10.0000 mL | INTRAVENOUS | Status: DC | PRN
  Administered 2016-02-15: 10 mL
  Filled 2016-02-15: qty 10

## 2016-02-15 MED ORDER — HEPARIN SOD (PORK) LOCK FLUSH 100 UNIT/ML IV SOLN
500.0000 [IU] | Freq: Once | INTRAVENOUS | Status: AC | PRN
  Administered 2016-02-15: 500 [IU]
  Filled 2016-02-15: qty 5

## 2016-02-15 NOTE — Progress Notes (Signed)
To be completed in flush room

## 2016-02-24 ENCOUNTER — Other Ambulatory Visit: Payer: Self-pay | Admitting: Oncology

## 2016-02-26 ENCOUNTER — Other Ambulatory Visit: Payer: Medicare Other

## 2016-02-26 ENCOUNTER — Ambulatory Visit: Payer: Medicare Other | Admitting: Nurse Practitioner

## 2016-02-26 ENCOUNTER — Ambulatory Visit: Payer: Medicare Other

## 2016-02-28 ENCOUNTER — Ambulatory Visit: Payer: Medicare Other

## 2016-02-28 NOTE — Progress Notes (Signed)
Pt not treated on 02/26/16,injection not given at this time

## 2016-02-29 ENCOUNTER — Telehealth: Payer: Self-pay | Admitting: *Deleted

## 2016-02-29 ENCOUNTER — Telehealth: Payer: Self-pay | Admitting: Nurse Practitioner

## 2016-02-29 NOTE — Telephone Encounter (Signed)
Per staff message I have scheduled appt for Monday. First available given

## 2016-02-29 NOTE — Telephone Encounter (Signed)
spoke w/ pt confirmed 6/19 apts

## 2016-03-03 ENCOUNTER — Ambulatory Visit (HOSPITAL_BASED_OUTPATIENT_CLINIC_OR_DEPARTMENT_OTHER): Payer: Medicare Other | Admitting: Nurse Practitioner

## 2016-03-03 ENCOUNTER — Telehealth: Payer: Self-pay | Admitting: Oncology

## 2016-03-03 ENCOUNTER — Other Ambulatory Visit (HOSPITAL_BASED_OUTPATIENT_CLINIC_OR_DEPARTMENT_OTHER): Payer: Medicare Other

## 2016-03-03 ENCOUNTER — Ambulatory Visit (HOSPITAL_BASED_OUTPATIENT_CLINIC_OR_DEPARTMENT_OTHER): Payer: Medicare Other

## 2016-03-03 VITALS — BP 125/52 | HR 66 | Temp 98.1°F | Resp 19 | Wt 179.0 lb

## 2016-03-03 DIAGNOSIS — C787 Secondary malignant neoplasm of liver and intrahepatic bile duct: Secondary | ICD-10-CM | POA: Diagnosis not present

## 2016-03-03 DIAGNOSIS — G620 Drug-induced polyneuropathy: Secondary | ICD-10-CM | POA: Diagnosis not present

## 2016-03-03 DIAGNOSIS — C189 Malignant neoplasm of colon, unspecified: Secondary | ICD-10-CM

## 2016-03-03 DIAGNOSIS — C182 Malignant neoplasm of ascending colon: Secondary | ICD-10-CM

## 2016-03-03 DIAGNOSIS — Z5111 Encounter for antineoplastic chemotherapy: Secondary | ICD-10-CM | POA: Diagnosis present

## 2016-03-03 DIAGNOSIS — C180 Malignant neoplasm of cecum: Secondary | ICD-10-CM

## 2016-03-03 LAB — CBC WITH DIFFERENTIAL/PLATELET
BASO%: 0.1 % (ref 0.0–2.0)
Basophils Absolute: 0 10*3/uL (ref 0.0–0.1)
EOS%: 1.4 % (ref 0.0–7.0)
Eosinophils Absolute: 0.2 10*3/uL (ref 0.0–0.5)
HEMATOCRIT: 35 % — AB (ref 38.4–49.9)
HGB: 11 g/dL — ABNORMAL LOW (ref 13.0–17.1)
LYMPH#: 1.7 10*3/uL (ref 0.9–3.3)
LYMPH%: 11.5 % — ABNORMAL LOW (ref 14.0–49.0)
MCH: 25 pg — ABNORMAL LOW (ref 27.2–33.4)
MCHC: 31.4 g/dL — AB (ref 32.0–36.0)
MCV: 79.5 fL (ref 79.3–98.0)
MONO#: 1.2 10*3/uL — AB (ref 0.1–0.9)
MONO%: 8.1 % (ref 0.0–14.0)
NEUT%: 78.9 % — ABNORMAL HIGH (ref 39.0–75.0)
NEUTROS ABS: 11.9 10*3/uL — AB (ref 1.5–6.5)
PLATELETS: 181 10*3/uL (ref 140–400)
RBC: 4.4 10*6/uL (ref 4.20–5.82)
RDW: 19.1 % — ABNORMAL HIGH (ref 11.0–14.6)
WBC: 15.1 10*3/uL — AB (ref 4.0–10.3)

## 2016-03-03 LAB — COMPREHENSIVE METABOLIC PANEL
ALT: 11 U/L (ref 0–55)
ANION GAP: 7 meq/L (ref 3–11)
AST: 44 U/L — AB (ref 5–34)
Albumin: 3.3 g/dL — ABNORMAL LOW (ref 3.5–5.0)
Alkaline Phosphatase: 223 U/L — ABNORMAL HIGH (ref 40–150)
BILIRUBIN TOTAL: 0.82 mg/dL (ref 0.20–1.20)
BUN: 10.4 mg/dL (ref 7.0–26.0)
CHLORIDE: 107 meq/L (ref 98–109)
CO2: 25 meq/L (ref 22–29)
CREATININE: 0.9 mg/dL (ref 0.7–1.3)
Calcium: 9.9 mg/dL (ref 8.4–10.4)
EGFR: >90 mL/min/{1.73_m2} (ref >90)
Glucose: 92 mg/dl (ref 70–140)
Potassium: 4.4 mEq/L (ref 3.5–5.1)
Sodium: 139 mEq/L (ref 136–145)
TOTAL PROTEIN: 6.9 g/dL (ref 6.4–8.3)

## 2016-03-03 MED ORDER — SODIUM CHLORIDE 0.9 % IV SOLN
10.0000 mg | Freq: Once | INTRAVENOUS | Status: AC
  Administered 2016-03-03: 10 mg via INTRAVENOUS
  Filled 2016-03-03: qty 1

## 2016-03-03 MED ORDER — SODIUM CHLORIDE 0.9 % IV SOLN
1800.0000 mg/m2 | INTRAVENOUS | Status: DC
  Administered 2016-03-03: 3800 mg via INTRAVENOUS
  Filled 2016-03-03: qty 76

## 2016-03-03 MED ORDER — ATROPINE SULFATE 1 MG/ML IJ SOLN: 0.5000 mg | Freq: Once | INTRAMUSCULAR | Status: DC | PRN

## 2016-03-03 MED ORDER — FLUOROURACIL CHEMO INJECTION 2.5 GM/50ML
300.0000 mg/m2 | Freq: Once | INTRAVENOUS | Status: AC
  Administered 2016-03-03: 650 mg via INTRAVENOUS
  Filled 2016-03-03: qty 13

## 2016-03-03 MED ORDER — PALONOSETRON HCL INJECTION 0.25 MG/5ML
INTRAVENOUS | Status: AC
  Filled 2016-03-03: qty 5

## 2016-03-03 MED ORDER — SODIUM CHLORIDE 0.9 % IV SOLN
Freq: Once | INTRAVENOUS | Status: AC
  Administered 2016-03-03: 15:00:00 via INTRAVENOUS

## 2016-03-03 MED ORDER — PALONOSETRON HCL INJECTION 0.25 MG/5ML
0.2500 mg | Freq: Once | INTRAVENOUS | Status: AC
  Administered 2016-03-03: 0.25 mg via INTRAVENOUS

## 2016-03-03 MED ORDER — LEUCOVORIN CALCIUM INJECTION 350 MG
300.0000 mg/m2 | Freq: Once | INTRAVENOUS | Status: AC
  Administered 2016-03-03: 634 mg via INTRAVENOUS
  Filled 2016-03-03: qty 31.7

## 2016-03-03 MED ORDER — DEXTROSE 5 % IV SOLN
133.0000 mg/m2 | Freq: Once | INTRAVENOUS | Status: AC
  Administered 2016-03-03: 280 mg via INTRAVENOUS
  Filled 2016-03-03: qty 10

## 2016-03-03 NOTE — Progress Notes (Signed)
  [facility] OFFICE PROGRESS NOTE   Diagnosis:  Colon cancer  INTERVAL HISTORY:   Harold Price returns after missing a follow-up visit last week. His wife was admitted to the hospital last week and underwent leg amputation.  He completed cycle 3 FOLFIRI 02/13/2016. He denies nausea/vomiting. No mouth sores. Diarrhea is controlled with Imodium and Lomotil. He reports a good appetite. He denies pain.  Objective:  Vital signs in last 24 hours:  Blood pressure 125/52, pulse 66, temperature 98.1 F (36.7 C), temperature source Oral, resp. rate 19, weight 179 lb (81.194 kg), SpO2 100 %.    HEENT: No thrush or ulcers. Resp: Lungs clear bilaterally. Cardio: Regular rate and rhythm. GI: Abdomen soft and nontender. No hepatomegaly. Right lower quadrant ileostomy. Vascular: Trace bilateral pretibial/lower leg edema. Port-A-Cath without erythema.  Lab Results:  Lab Results  Component Value Date   WBC 15.1* 03/03/2016   HGB 11.0* 03/03/2016   HCT 35.0* 03/03/2016   MCV 79.5 03/03/2016   PLT 181 03/03/2016   NEUTROABS 11.9* 03/03/2016    Imaging:  No results found.  Medications: I have reviewed the patient's current medications.  Assessment/Plan: 1. 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  Cycle 5 FOLFOX 08/20/2015 (oxaliplatin dose reduced)  Cycle 6 FOLFOX  09/11/2015  Cycle 7 FOLFOX 09/25/2015  Cycle 8 FOLFOX 10/09/2015 (oxaliplatin held due to thrombocytopenia)  Restaging CT abdomen and pelvis 10/19/2015 with some lesions appearing smaller with the majority stable to slightly larger.  10/23/2015 discussed FOLFIRI versus a treatment break. We mutually decided on a treatment break.  CT abdomen/pelvis 01/04/2016-new and enlarging liver lesions  Cycle 1 FOLFIRI 01/08/2016  Cycle 2 FOLFIRI 01/29/2016 with Neulasta support  Cycle 3 FOLFIRI 02/13/2016 with Neulasta support  Cycle 4 FOLFIRI 03/03/2016 (Neulasta held due to elevated white count)   2. Bleeding duodenal ulcer February 2014  3. History of heavy alcohol use  4. Enterobacter bacteremia following surgery August 2016  5. Microcytic anemia secondary to #1, taking ferrous sulfate-improved  6. CT chest 05/15/2015-negative for metastatic disease, fatty mass at the left chest wall-low-grade neoplasm not excluded  7. Port-A-Cath placement 06/20/2015  8. Admission with a partial small bowel dysfunction versus ileus 08/14/2015-improved with NG tube decompression  9. 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 appears stable. He has completed 3 cycles of FOLFIRI. Plan to proceed with cycle 4 today as scheduled. Neulasta will be held on the day of pump discontinuation due to the elevated white count.  He will return for a follow-up visit and cycle 5 in 3 weeks. He will contact the office in the interim with any problems.  Plan reviewed with Dr. Benay Spice.    Ned Card ANP/GNP-BC   03/03/2016  12:01 PM

## 2016-03-03 NOTE — Telephone Encounter (Signed)
lvm for pt for July appt.Marland KitchenMarland KitchenMarland KitchenMarland Kitchenpt will also get print out from chemo

## 2016-03-03 NOTE — Telephone Encounter (Signed)
Gave and printed appt sched and avs for pt for June and July  °

## 2016-03-04 LAB — CEA (PARALLEL TESTING): CEA: 41.1 ng/mL — AB

## 2016-03-04 LAB — CEA: CEA: 118 ng/mL — ABNORMAL HIGH (ref 0.0–4.7)

## 2016-03-05 ENCOUNTER — Ambulatory Visit (HOSPITAL_BASED_OUTPATIENT_CLINIC_OR_DEPARTMENT_OTHER): Payer: Medicare Other

## 2016-03-05 DIAGNOSIS — C787 Secondary malignant neoplasm of liver and intrahepatic bile duct: Secondary | ICD-10-CM

## 2016-03-05 DIAGNOSIS — C182 Malignant neoplasm of ascending colon: Secondary | ICD-10-CM

## 2016-03-05 DIAGNOSIS — C180 Malignant neoplasm of cecum: Secondary | ICD-10-CM

## 2016-03-05 DIAGNOSIS — C189 Malignant neoplasm of colon, unspecified: Secondary | ICD-10-CM

## 2016-03-05 MED ORDER — HEPARIN SOD (PORK) LOCK FLUSH 100 UNIT/ML IV SOLN
500.0000 [IU] | Freq: Once | INTRAVENOUS | Status: AC | PRN
  Administered 2016-03-05: 500 [IU]
  Filled 2016-03-05: qty 5

## 2016-03-05 MED ORDER — SODIUM CHLORIDE 0.9% FLUSH
10.0000 mL | INTRAVENOUS | Status: DC | PRN
  Administered 2016-03-05: 10 mL
  Filled 2016-03-05: qty 10

## 2016-03-05 NOTE — Patient Instructions (Signed)

## 2016-03-23 ENCOUNTER — Other Ambulatory Visit: Payer: Self-pay | Admitting: Oncology

## 2016-03-24 ENCOUNTER — Ambulatory Visit (HOSPITAL_BASED_OUTPATIENT_CLINIC_OR_DEPARTMENT_OTHER): Payer: Medicare Other | Admitting: Nurse Practitioner

## 2016-03-24 ENCOUNTER — Telehealth: Payer: Self-pay | Admitting: Nurse Practitioner

## 2016-03-24 ENCOUNTER — Ambulatory Visit (HOSPITAL_BASED_OUTPATIENT_CLINIC_OR_DEPARTMENT_OTHER): Payer: Medicare Other

## 2016-03-24 ENCOUNTER — Other Ambulatory Visit (HOSPITAL_BASED_OUTPATIENT_CLINIC_OR_DEPARTMENT_OTHER): Payer: Medicare Other

## 2016-03-24 ENCOUNTER — Other Ambulatory Visit: Payer: Self-pay | Admitting: Nurse Practitioner

## 2016-03-24 VITALS — BP 139/56 | HR 63 | Temp 98.3°F | Resp 19 | Ht 74.0 in | Wt 179.7 lb

## 2016-03-24 DIAGNOSIS — C180 Malignant neoplasm of cecum: Secondary | ICD-10-CM

## 2016-03-24 DIAGNOSIS — C787 Secondary malignant neoplasm of liver and intrahepatic bile duct: Principal | ICD-10-CM

## 2016-03-24 DIAGNOSIS — Z5111 Encounter for antineoplastic chemotherapy: Secondary | ICD-10-CM | POA: Diagnosis not present

## 2016-03-24 DIAGNOSIS — D509 Iron deficiency anemia, unspecified: Secondary | ICD-10-CM

## 2016-03-24 DIAGNOSIS — C189 Malignant neoplasm of colon, unspecified: Secondary | ICD-10-CM

## 2016-03-24 DIAGNOSIS — C182 Malignant neoplasm of ascending colon: Secondary | ICD-10-CM

## 2016-03-24 DIAGNOSIS — G620 Drug-induced polyneuropathy: Secondary | ICD-10-CM

## 2016-03-24 LAB — CBC WITH DIFFERENTIAL/PLATELET
BASO%: 0.4 % (ref 0.0–2.0)
Basophils Absolute: 0 10*3/uL (ref 0.0–0.1)
EOS ABS: 0.3 10*3/uL (ref 0.0–0.5)
EOS%: 7.8 % — ABNORMAL HIGH (ref 0.0–7.0)
HCT: 34.5 % — ABNORMAL LOW (ref 38.4–49.9)
HEMOGLOBIN: 10.9 g/dL — AB (ref 13.0–17.1)
LYMPH#: 0.8 10*3/uL — AB (ref 0.9–3.3)
LYMPH%: 25.9 % (ref 14.0–49.0)
MCH: 24.8 pg — ABNORMAL LOW (ref 27.2–33.4)
MCHC: 31.7 g/dL — ABNORMAL LOW (ref 32.0–36.0)
MCV: 78.2 fL — AB (ref 79.3–98.0)
MONO#: 0.9 10*3/uL (ref 0.1–0.9)
MONO%: 26.4 % — ABNORMAL HIGH (ref 0.0–14.0)
NEUT%: 39.5 % (ref 39.0–75.0)
NEUTROS ABS: 1.3 10*3/uL — AB (ref 1.5–6.5)
PLATELETS: 213 10*3/uL (ref 140–400)
RBC: 4.41 10*6/uL (ref 4.20–5.82)
RDW: 19.3 % — AB (ref 11.0–14.6)
WBC: 3.3 10*3/uL — AB (ref 4.0–10.3)

## 2016-03-24 LAB — COMPREHENSIVE METABOLIC PANEL
AST: 41 U/L — ABNORMAL HIGH (ref 5–34)
Albumin: 3.3 g/dL — ABNORMAL LOW (ref 3.5–5.0)
Alkaline Phosphatase: 232 U/L — ABNORMAL HIGH (ref 40–150)
Anion Gap: 8 mEq/L (ref 3–11)
BILIRUBIN TOTAL: 0.82 mg/dL (ref 0.20–1.20)
BUN: 9.1 mg/dL (ref 7.0–26.0)
CO2: 25 meq/L (ref 22–29)
CREATININE: 0.9 mg/dL (ref 0.7–1.3)
Calcium: 9.5 mg/dL (ref 8.4–10.4)
Chloride: 104 mEq/L (ref 98–109)
EGFR: >90 mL/min/{1.73_m2} (ref >90)
GLUCOSE: 92 mg/dL (ref 70–140)
Potassium: 4.2 mEq/L (ref 3.5–5.1)
SODIUM: 137 meq/L (ref 136–145)
TOTAL PROTEIN: 6.5 g/dL (ref 6.4–8.3)

## 2016-03-24 MED ORDER — PALONOSETRON HCL INJECTION 0.25 MG/5ML
0.2500 mg | Freq: Once | INTRAVENOUS | Status: AC
  Administered 2016-03-24: 0.25 mg via INTRAVENOUS

## 2016-03-24 MED ORDER — FLUOROURACIL CHEMO INJECTION 2.5 GM/50ML
300.0000 mg/m2 | Freq: Once | INTRAVENOUS | Status: AC
  Administered 2016-03-24: 650 mg via INTRAVENOUS
  Filled 2016-03-24: qty 13

## 2016-03-24 MED ORDER — SODIUM CHLORIDE 0.9 % IV SOLN
Freq: Once | INTRAVENOUS | Status: AC
  Administered 2016-03-24: 15:00:00 via INTRAVENOUS

## 2016-03-24 MED ORDER — PALONOSETRON HCL INJECTION 0.25 MG/5ML
INTRAVENOUS | Status: AC
  Filled 2016-03-24: qty 5

## 2016-03-24 MED ORDER — SODIUM CHLORIDE 0.9 % IV SOLN
1800.0000 mg/m2 | INTRAVENOUS | Status: DC
  Administered 2016-03-24: 3800 mg via INTRAVENOUS
  Filled 2016-03-24: qty 76

## 2016-03-24 MED ORDER — LEUCOVORIN CALCIUM INJECTION 350 MG
300.0000 mg/m2 | Freq: Once | INTRAMUSCULAR | Status: AC
  Administered 2016-03-24: 634 mg via INTRAVENOUS
  Filled 2016-03-24: qty 31.7

## 2016-03-24 MED ORDER — SODIUM CHLORIDE 0.9 % IV SOLN
10.0000 mg | Freq: Once | INTRAVENOUS | Status: AC
  Administered 2016-03-24: 10 mg via INTRAVENOUS
  Filled 2016-03-24: qty 1

## 2016-03-24 MED ORDER — ATROPINE SULFATE 1 MG/ML IJ SOLN
0.5000 mg | Freq: Once | INTRAMUSCULAR | Status: AC | PRN
  Administered 2016-03-24: 0.5 mg via INTRAVENOUS

## 2016-03-24 MED ORDER — SODIUM CHLORIDE 0.9% FLUSH
10.0000 mL | INTRAVENOUS | Status: DC | PRN
  Filled 2016-03-24: qty 10

## 2016-03-24 MED ORDER — IRINOTECAN HCL CHEMO INJECTION 100 MG/5ML
133.0000 mg/m2 | Freq: Once | INTRAVENOUS | Status: AC
  Administered 2016-03-24: 280 mg via INTRAVENOUS
  Filled 2016-03-24: qty 10

## 2016-03-24 MED ORDER — HEPARIN SOD (PORK) LOCK FLUSH 100 UNIT/ML IV SOLN
500.0000 [IU] | Freq: Once | INTRAVENOUS | Status: DC | PRN
  Filled 2016-03-24: qty 5

## 2016-03-24 MED ORDER — ATROPINE SULFATE 1 MG/ML IJ SOLN
INTRAMUSCULAR | Status: AC
  Filled 2016-03-24: qty 1

## 2016-03-24 NOTE — Telephone Encounter (Signed)
per pfo to sch pt appt-gave pt copy of avs °

## 2016-03-24 NOTE — Patient Instructions (Signed)
[facility] Discharge Instructions for Patients Receiving Chemotherapy  Today you received the following chemotherapy agents:  5FU, Irinotecan, and Leucovorin.  To help prevent nausea and vomiting after your treatment, we encourage you to take your nausea medication as prescribed.   If you develop nausea and vomiting that is not controlled by your nausea medication, call the clinic.   BELOW ARE SYMPTOMS THAT SHOULD BE REPORTED IMMEDIATELY:  *FEVER GREATER THAN 100.5 F  *CHILLS WITH OR WITHOUT FEVER  NAUSEA AND VOMITING THAT IS NOT CONTROLLED WITH YOUR NAUSEA MEDICATION  *UNUSUAL SHORTNESS OF BREATH  *UNUSUAL BRUISING OR BLEEDING  TENDERNESS IN MOUTH AND THROAT WITH OR WITHOUT PRESENCE OF ULCERS  *URINARY PROBLEMS  *BOWEL PROBLEMS  UNUSUAL RASH Items with * indicate a potential emergency and should be followed up as soon as possible.  Feel free to call the clinic you have any questions or concerns. The clinic phone number is [phone].  Please show the [place] at check-in to the Emergency Department and triage nurse.

## 2016-03-24 NOTE — Progress Notes (Signed)
[place] OFFICE PROGRESS NOTE   Diagnosis:  Colon cancer  INTERVAL HISTORY:   Mr. Canada returns as scheduled. He completed cycle 4 FOLFIRI 03/03/2016. He denies nausea/vomiting. No mouth sores. Diarrhea is controlled with a combination of Lomotil and Imodium. No bleeding. He denies shortness of breath. No pain.  Objective:  Vital signs in last 24 hours:  Blood pressure 139/56, pulse 63, temperature 98.3 F (36.8 C), temperature source Oral, resp. rate 19, height '6\' 2"'$  (1.88 m), weight 179 lb 11.2 oz (81.511 kg), SpO2 100 %.    HEENT: No thrush or ulcers. Resp: Lungs clear bilaterally. Cardio: Regular rate and rhythm. GI: Abdomen soft and nontender. No hepatomegaly. Right lower quadrant ileostomy with thick liquid stool. Vascular: Trace lower leg edema bilaterally. Skin: Palms with hyperpigmentation. Port-A-Cath without erythema.    Lab Results:  Lab Results  Component Value Date   WBC 3.3* 03/24/2016   HGB 10.9* 03/24/2016   HCT 34.5* 03/24/2016   MCV 78.2* 03/24/2016   PLT 213 03/24/2016   NEUTROABS 1.3* 03/24/2016    Imaging:  No results found.  Medications: I have reviewed the patient's current medications.  Assessment/Plan: 1. 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  Cycle 5 FOLFOX 08/20/2015 (oxaliplatin dose reduced)  Cycle 6 FOLFOX  09/11/2015  Cycle 7 FOLFOX 09/25/2015  Cycle 8 FOLFOX 10/09/2015 (oxaliplatin held due to thrombocytopenia)  Restaging CT abdomen and pelvis 10/19/2015 with some lesions appearing smaller with the majority stable to slightly larger.  10/23/2015 discussed FOLFIRI versus a treatment break. We mutually decided on a treatment break.  CT abdomen/pelvis 01/04/2016-new and enlarging liver lesions  Cycle 1 FOLFIRI 01/08/2016  Cycle 2 FOLFIRI 01/29/2016 with Neulasta support  Cycle 3 FOLFIRI 02/13/2016 with Neulasta support  Cycle 4 FOLFIRI 03/03/2016 (Neulasta held due to elevated white count)  Cycle 5 FOLFIRI 03/24/2016 with Neulasta support    2. Bleeding duodenal ulcer February 2014  3. History of heavy alcohol use  4. Enterobacter bacteremia following surgery August 2016  5. Microcytic anemia secondary to #1, taking ferrous sulfate-improved  6. CT chest 05/15/2015-negative for metastatic disease, fatty mass at the left chest wall-low-grade neoplasm not excluded  7. Port-A-Cath placement 06/20/2015  8. Admission with a partial small bowel dysfunction versus ileus 08/14/2015-improved with NG tube decompression  9. 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 Grunewald appears stable. He has completed 4 cycles of FOLFIRI. Plan to proceed with cycle 5 today as scheduled.   The neutrophil count is mildly decreased. He will receive Neulasta on the day of pump discontinuation. He understands to contact the office with any signs of infection.  We will obtain restaging CT scans in 2 weeks. He will return for a follow-up visit in 3 weeks. He will contact the office in the interim as outlined above or with any other problems.  Plan reviewed with Dr. Benay Spice.      Ned Card ANP/GNP-BC  03/24/2016  1:41 PM

## 2016-03-24 NOTE — Progress Notes (Signed)
Reviewed pt labs (CBC) with Dr. Benay Spice and pt ok to treat with ANC of 1.3 today.

## 2016-03-26 ENCOUNTER — Ambulatory Visit: Payer: Medicare Other

## 2016-03-26 ENCOUNTER — Ambulatory Visit (HOSPITAL_BASED_OUTPATIENT_CLINIC_OR_DEPARTMENT_OTHER): Payer: Medicare Other

## 2016-03-26 VITALS — BP 130/51 | HR 67 | Temp 97.8°F | Resp 18

## 2016-03-26 DIAGNOSIS — C787 Secondary malignant neoplasm of liver and intrahepatic bile duct: Secondary | ICD-10-CM

## 2016-03-26 DIAGNOSIS — Z5189 Encounter for other specified aftercare: Secondary | ICD-10-CM | POA: Diagnosis not present

## 2016-03-26 DIAGNOSIS — C182 Malignant neoplasm of ascending colon: Secondary | ICD-10-CM

## 2016-03-26 DIAGNOSIS — C189 Malignant neoplasm of colon, unspecified: Secondary | ICD-10-CM

## 2016-03-26 DIAGNOSIS — C180 Malignant neoplasm of cecum: Secondary | ICD-10-CM | POA: Diagnosis present

## 2016-03-26 MED ORDER — HEPARIN SOD (PORK) LOCK FLUSH 100 UNIT/ML IV SOLN
500.0000 [IU] | Freq: Once | INTRAVENOUS | Status: AC | PRN
  Administered 2016-03-26: 500 [IU]
  Filled 2016-03-26: qty 5

## 2016-03-26 MED ORDER — PEGFILGRASTIM INJECTION 6 MG/0.6ML ~~LOC~~
6.0000 mg | PREFILLED_SYRINGE | Freq: Once | SUBCUTANEOUS | Status: AC
  Administered 2016-03-26: 6 mg via SUBCUTANEOUS
  Filled 2016-03-26: qty 0.6

## 2016-03-26 MED ORDER — SODIUM CHLORIDE 0.9% FLUSH
10.0000 mL | INTRAVENOUS | Status: DC | PRN
  Administered 2016-03-26: 10 mL
  Filled 2016-03-26: qty 10

## 2016-03-26 NOTE — Patient Instructions (Signed)

## 2016-03-26 NOTE — Progress Notes (Signed)
Injection given in Flush Room 2

## 2016-04-02 IMAGING — CT CT ABD-PELV W/ CM
2 of 5 series · 16 of 46 positions shown, 18 images · IV contrast (OMNIPAQUE 300)
Comparison: CT 04/28/2015.  MRI 06/01/2015.

CLINICAL DATA: Mid abdominal pain. History of colon cancer status
post right hemicolectomy.

EXAM:
CT ABDOMEN AND PELVIS WITH CONTRAST
TECHNIQUE: Multidetector CT imaging of the abdomen and pelvis was performed
using the standard protocol following bolus administration of
intravenous contrast.
CONTRAST:  100mL OMNIPAQUE IOHEXOL 300 MG/ML  SOLN

[Series 2: abd/pel with · axial · 0.80mm/px · z∈[-462,-77]mm · 13 of 87 slices shown, 15 images]
[im 5/87  soft-tissue]
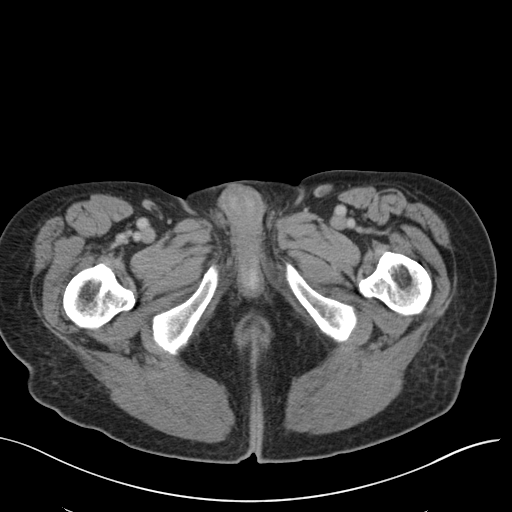
[im 5/87  bone]
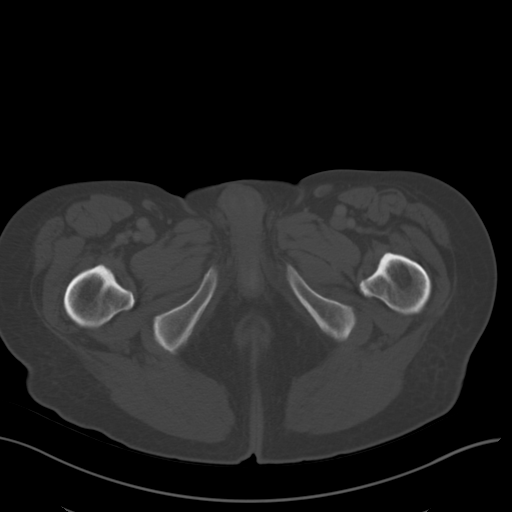
[im 10/87  soft-tissue]
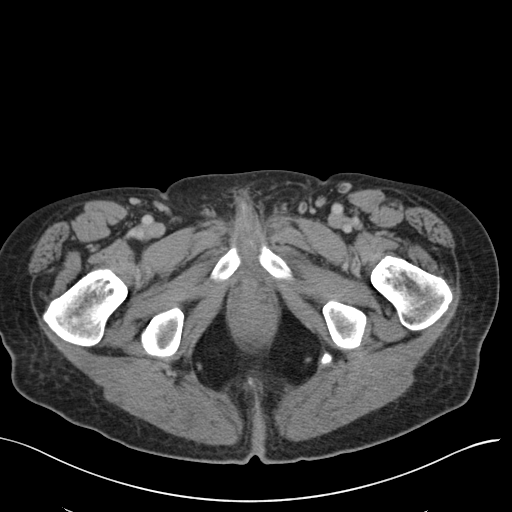
[im 20/87  soft-tissue]
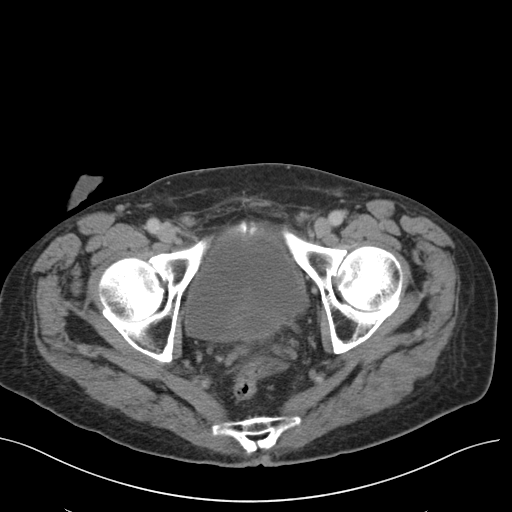
[im 24/87  soft-tissue]
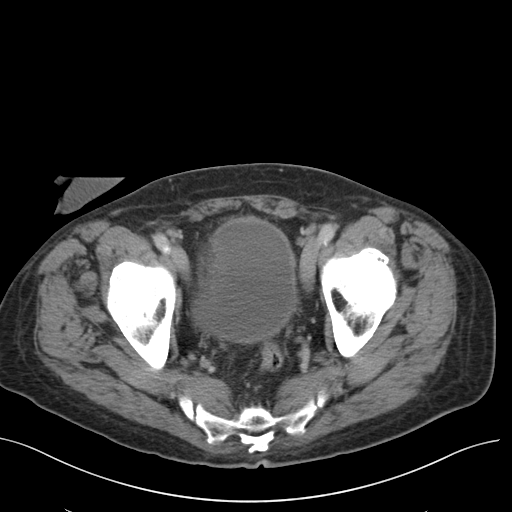
[im 29/87  soft-tissue]
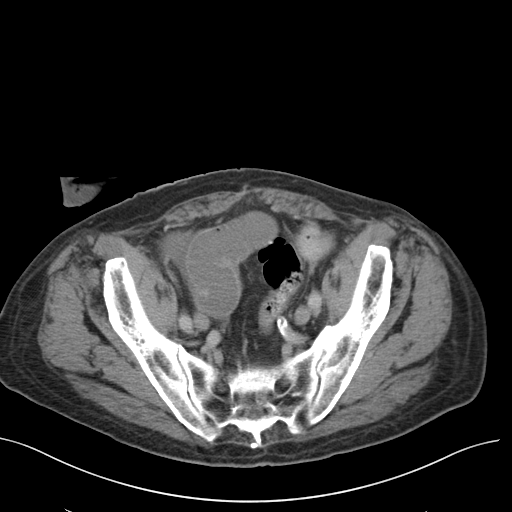
[im 39/87  soft-tissue]
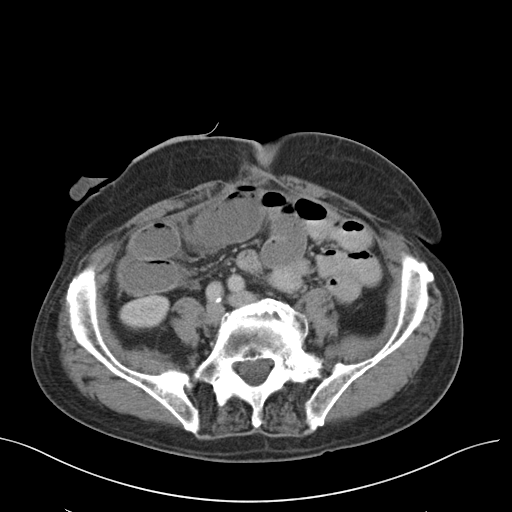
[im 44/87  soft-tissue]
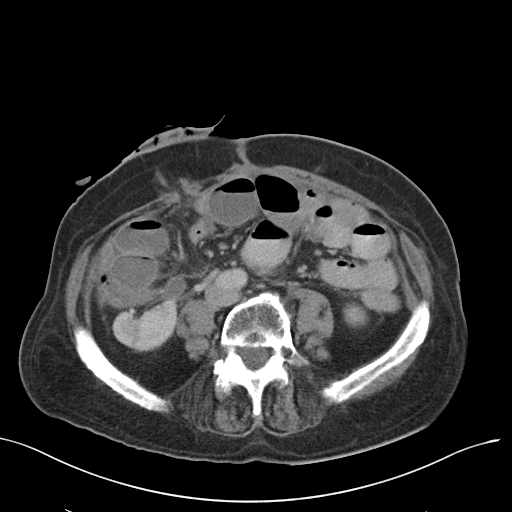
[im 48/87  soft-tissue]
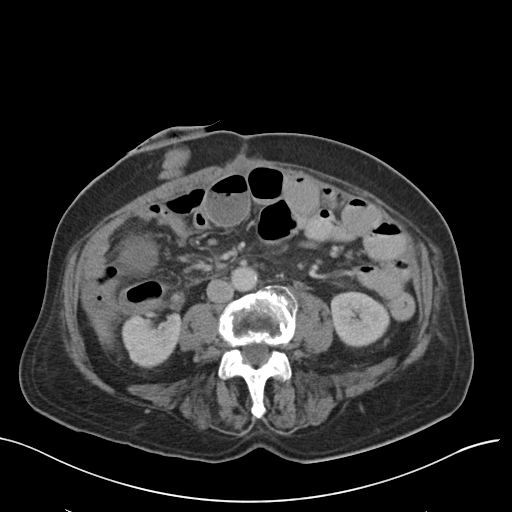
[im 58/87  soft-tissue]
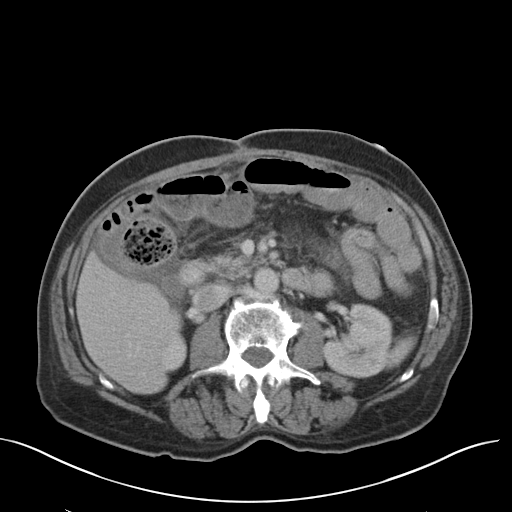
[im 58/87  bone]
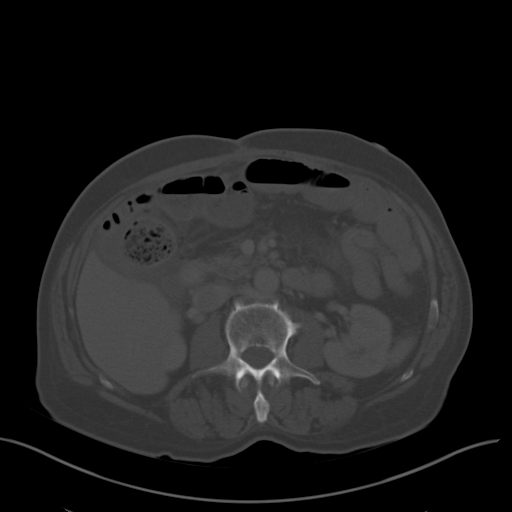
[im 63/87  soft-tissue]
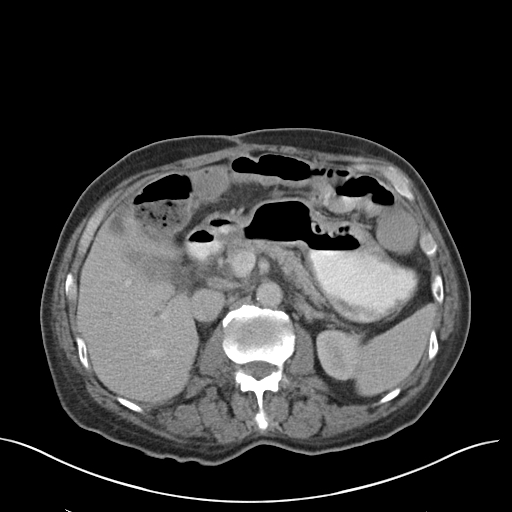
[im 67/87  soft-tissue]
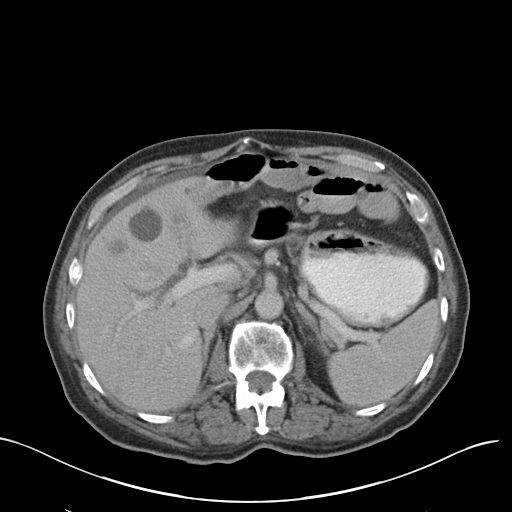
[im 77/87  soft-tissue]
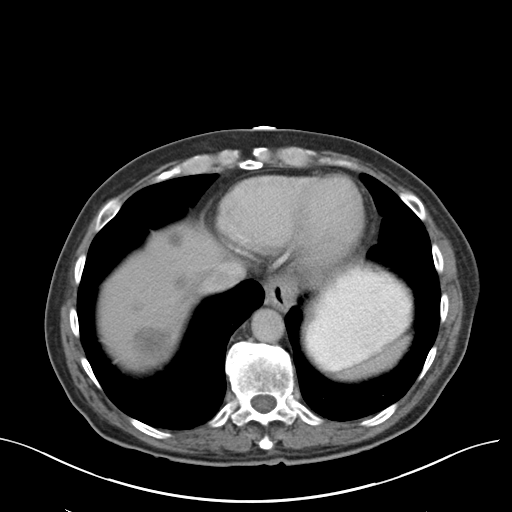
[im 82/87  soft-tissue]
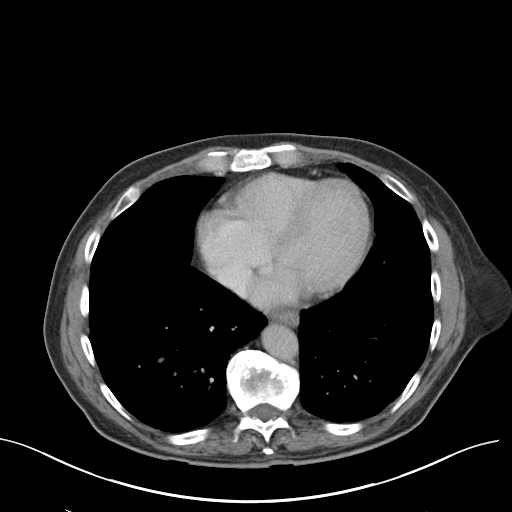

[Series 5: coronal a/|p · coronal · 0.80mm/px · 3 of 91 slices shown]
[im 31/91  soft-tissue]
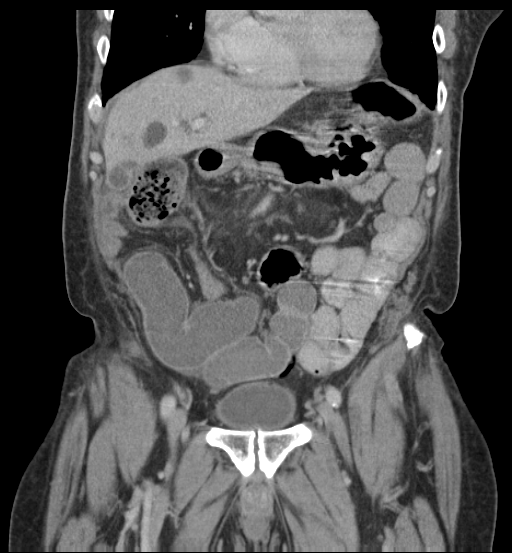
[im 41/91  soft-tissue]
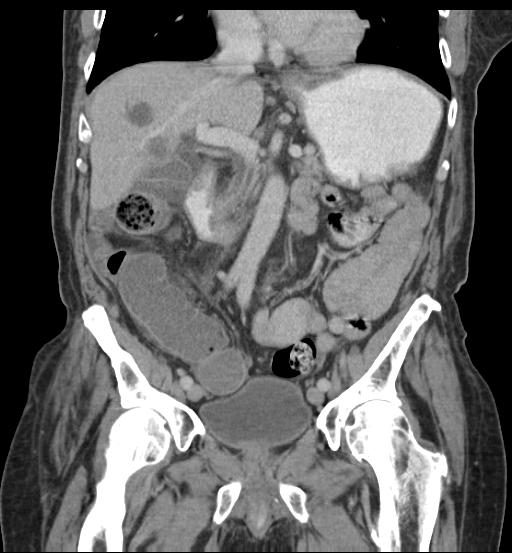
[im 51/91  soft-tissue]
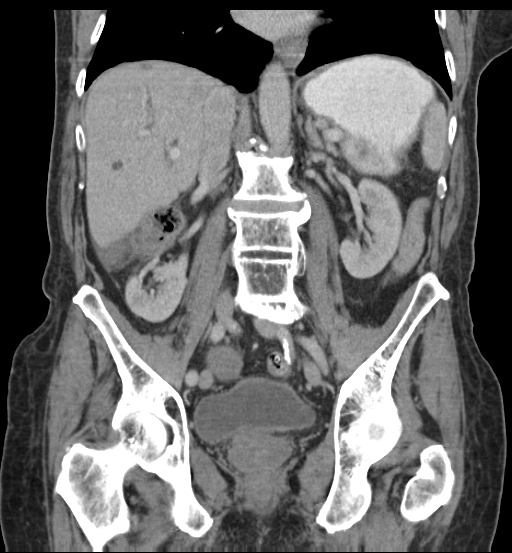

[16 of 46 positions shown; findings below may reference images not displayed]

FINDINGS: Lower chest: Interval improved aeration of the lung bases with mild
residual atelectasis or scarring. No significant pleural effusion.

Hepatobiliary: There is progressive multifocal hepatic metastatic
disease. Index lesion in the dome of the right hepatic lobe measures
2.9 x 2.7 cm on image 12 (2.8 x 2.4 cm on MRI). Ill-defined lesion
superior to the gallbladder measures 2.5 x 2.1 cm on image 23. There
is a stable simple cyst measuring 2.6 cm in the medial segment of
the left lobe on image number 21. No evidence of gallstones,
gallbladder wall thickening or biliary dilatation.

Pancreas: Stable mild pancreatic atrophy. No focal mass lesion or
significant ductal dilatation.

Spleen: Normal in size without focal abnormality.

Adrenals/Urinary Tract: Both adrenal glands appear normal. The
kidneys appear unchanged. No evidence of renal mass or
hydronephrosis. The bladder appears normal.

Stomach/Bowel: Small hiatal hernia. Right lower quadrant ileostomy
noted status post subtotal colectomy. There is mild diffuse
dilatation of the small bowel with air-fluid levels. Small bowel
feces sign noted in the right mid abdomen. There is no significant
bowel wall thickening.

Vascular/Lymphatic: There are no enlarged abdominal or pelvic lymph
nodes. Small lymph nodes in the porta hepatis are stable, not
pathologically enlarged. Stable mild aortoiliac atherosclerosis.

Reproductive: Stable mild enlargement of the prostate gland.

Other: Ascites has decreased in volume compared with the prior CT. A
small amount of ascites remains, primarily in the right upper
quadrant. No peritoneal nodularity identified. There is mild
mesenteric edema.

Musculoskeletal: No acute or significant osseous findings. Partially
bridging osteophytes are again noted within the lumbar spine and
sacroiliac joints.
IMPRESSION: 1. Progressive multifocal hepatic metastatic disease.
2. No definite extrahepatic tumor identified. Ascites has improved
from 3 months ago, without peritoneal nodularity.
3. Diffuse small bowel distension status post partial colectomy and
ileostomy, worrisome for partial small bowel obstruction. No clear
demonstrated etiology.
4. These results will be called to the ordering clinician or
representative by the Radiologist Assistant, and communication
documented in the PACS or zVision Dashboard.

## 2016-04-07 ENCOUNTER — Other Ambulatory Visit: Payer: Self-pay | Admitting: *Deleted

## 2016-04-07 NOTE — Telephone Encounter (Signed)
Pt.'s niece, Dewaine Oats called regarding refill for a medication for patient, but is unsure of what medication it is.  She believes it is something for diarrhea.  Lomotil is not due for refill at this time and instructed Dewaine Oats that Imodium is over the counter.  Dewaine Oats will check with patient and call us back if further assistance is needed.

## 2016-04-13 ENCOUNTER — Other Ambulatory Visit: Payer: Self-pay | Admitting: Oncology

## 2016-04-14 ENCOUNTER — Other Ambulatory Visit (HOSPITAL_BASED_OUTPATIENT_CLINIC_OR_DEPARTMENT_OTHER): Payer: Medicare Other

## 2016-04-14 ENCOUNTER — Telehealth: Payer: Self-pay | Admitting: Oncology

## 2016-04-14 ENCOUNTER — Ambulatory Visit (HOSPITAL_COMMUNITY)
Admission: RE | Admit: 2016-04-14 | Discharge: 2016-04-14 | Disposition: A | Discharge status: Home / Self Care | Payer: Medicare Other | Source: Ambulatory Visit | Attending: Nurse Practitioner | Admitting: Nurse Practitioner

## 2016-04-14 ENCOUNTER — Ambulatory Visit: Payer: Medicare Other

## 2016-04-14 ENCOUNTER — Ambulatory Visit (HOSPITAL_BASED_OUTPATIENT_CLINIC_OR_DEPARTMENT_OTHER): Payer: Medicare Other | Admitting: Nurse Practitioner

## 2016-04-14 VITALS — BP 135/50 | HR 60 | Temp 97.9°F | Resp 18 | Ht 74.0 in | Wt 179.5 lb

## 2016-04-14 DIAGNOSIS — R160 Hepatomegaly, not elsewhere classified: Secondary | ICD-10-CM

## 2016-04-14 DIAGNOSIS — C189 Malignant neoplasm of colon, unspecified: Secondary | ICD-10-CM

## 2016-04-14 DIAGNOSIS — C180 Malignant neoplasm of cecum: Secondary | ICD-10-CM

## 2016-04-14 DIAGNOSIS — D509 Iron deficiency anemia, unspecified: Secondary | ICD-10-CM

## 2016-04-14 DIAGNOSIS — G620 Drug-induced polyneuropathy: Secondary | ICD-10-CM | POA: Diagnosis not present

## 2016-04-14 DIAGNOSIS — R109 Unspecified abdominal pain: Secondary | ICD-10-CM

## 2016-04-14 DIAGNOSIS — C787 Secondary malignant neoplasm of liver and intrahepatic bile duct: Principal | ICD-10-CM

## 2016-04-14 DIAGNOSIS — Z95828 Presence of other vascular implants and grafts: Secondary | ICD-10-CM

## 2016-04-14 LAB — COMPREHENSIVE METABOLIC PANEL
ALT: 10 U/L (ref 0–55)
ANION GAP: 10 meq/L (ref 3–11)
AST: 50 U/L — ABNORMAL HIGH (ref 5–34)
Albumin: 3.3 g/dL — ABNORMAL LOW (ref 3.5–5.0)
Alkaline Phosphatase: 266 U/L — ABNORMAL HIGH (ref 40–150)
BUN: 9.2 mg/dL (ref 7.0–26.0)
CHLORIDE: 99 meq/L (ref 98–109)
CO2: 25 meq/L (ref 22–29)
Calcium: 9.8 mg/dL (ref 8.4–10.4)
Creatinine: 0.9 mg/dL (ref 0.7–1.3)
GLUCOSE: 92 mg/dL (ref 70–140)
POTASSIUM: 4.3 meq/L (ref 3.5–5.1)
SODIUM: 134 meq/L — AB (ref 136–145)
Total Bilirubin: 0.74 mg/dL (ref 0.20–1.20)
Total Protein: 6.7 g/dL (ref 6.4–8.3)

## 2016-04-14 LAB — CBC WITH DIFFERENTIAL/PLATELET
BASO%: 0.5 % (ref 0.0–2.0)
Basophils Absolute: 0.1 10*3/uL (ref 0.0–0.1)
EOS ABS: 0.2 10*3/uL (ref 0.0–0.5)
EOS%: 1.8 % (ref 0.0–7.0)
HCT: 34.2 % — ABNORMAL LOW (ref 38.4–49.9)
HGB: 10.7 g/dL — ABNORMAL LOW (ref 13.0–17.1)
LYMPH%: 10.3 % — AB (ref 14.0–49.0)
MCH: 24.4 pg — ABNORMAL LOW (ref 27.2–33.4)
MCHC: 31.2 g/dL — AB (ref 32.0–36.0)
MCV: 78.2 fL — AB (ref 79.3–98.0)
MONO#: 0.9 10*3/uL (ref 0.1–0.9)
MONO%: 7.8 % (ref 0.0–14.0)
NEUT%: 79.6 % — AB (ref 39.0–75.0)
NEUTROS ABS: 8.8 10*3/uL — AB (ref 1.5–6.5)
PLATELETS: 160 10*3/uL (ref 140–400)
RBC: 4.38 10*6/uL (ref 4.20–5.82)
RDW: 19 % — ABNORMAL HIGH (ref 11.0–14.6)
WBC: 11.1 10*3/uL — AB (ref 4.0–10.3)
lymph#: 1.1 10*3/uL (ref 0.9–3.3)

## 2016-04-14 MED ORDER — SODIUM CHLORIDE 0.9 % IJ SOLN
10.0000 mL | INTRAMUSCULAR | Status: DC | PRN
Start: 2016-04-14 — End: 2016-04-14
  Administered 2016-04-14: 10 mL via INTRAVENOUS
  Filled 2016-04-14: qty 10

## 2016-04-14 MED ORDER — IOPAMIDOL (ISOVUE-300) INJECTION 61%
100.0000 mL | Freq: Once | INTRAVENOUS | Status: AC | PRN
  Administered 2016-04-14: 100 mL via INTRAVENOUS

## 2016-04-14 MED ORDER — DIPHENOXYLATE-ATROPINE 2.5-0.025 MG PO TABS: 1.0000 | ORAL_TABLET | Freq: Two times a day (BID) | ORAL | 0 refills | Status: DC

## 2016-04-14 MED ORDER — HEPARIN SOD (PORK) LOCK FLUSH 100 UNIT/ML IV SOLN
500.0000 [IU] | Freq: Once | INTRAVENOUS | Status: AC | PRN
  Administered 2016-04-14: 500 [IU] via INTRAVENOUS
  Filled 2016-04-14: qty 5

## 2016-04-14 MED ORDER — HYDROCODONE-ACETAMINOPHEN 5-325 MG PO TABS: 1.0000 | ORAL_TABLET | Freq: Four times a day (QID) | ORAL | 0 refills | Status: DC | PRN

## 2016-04-14 MED FILL — HYDROCODON-APAP 5-325: 5-325 | 15 days supply | Qty: 60 | Fill #0

## 2016-04-14 NOTE — Telephone Encounter (Signed)
Gave pt cal & avs °

## 2016-04-14 NOTE — Progress Notes (Addendum)
[place] OFFICE PROGRESS NOTE   Diagnosis:  Colon cancer  INTERVAL HISTORY:   Mr. Gibbons returns as scheduled. He completed cycle 5 FOLFIRI 03/24/2016. He denies nausea/vomiting. No mouth sores. Diarrhea continues to be controlled with a combination of Lomotil and Imodium. He reports "muscle spasms" involving the right upper abdomen extending to the back. He denies leg weakness.  Objective:  Vital signs in last 24 hours:  Blood pressure (!) 135/50, pulse 60, temperature 97.9 F (36.6 C), temperature source Oral, resp. rate 18, height '6\' 2"'$  (1.88 m), weight 179 lb 8 oz (81.4 kg), SpO2 100 %.    HEENT: No thrush or ulcers. Resp: Lungs clear bilaterally. Cardio: Regular rate and rhythm. GI: Hepatomegaly with associated tenderness. Right lower quadrant ileostomy. Vascular: Trace pitting edema at the lower leg/ankles bilaterally. Neuro: Lower extremity motor strength 5 over 5. Port-A-Cath without erythema.  Lab Results:  Lab Results  Component Value Date   WBC 11.1 (H) 04/14/2016   HGB 10.7 (L) 04/14/2016   HCT 34.2 (L) 04/14/2016   MCV 78.2 (L) 04/14/2016   PLT 160 04/14/2016   NEUTROABS 8.8 (H) 04/14/2016    Imaging:  Ct Abdomen Pelvis W Contrast  Result Date: 04/14/2016 CLINICAL DATA:  Metastatic colon cancer to the liver. Abdominal pain. EXAM: CT ABDOMEN AND PELVIS WITH CONTRAST TECHNIQUE: Multidetector CT imaging of the abdomen and pelvis was performed using the standard protocol following bolus administration of intravenous contrast. CONTRAST:  100 cc Isovue 300 COMPARISON:  01/04/2016 FINDINGS: Lower chest: Scar is identified within the left base. No pleural or pericardial effusion. Hepatobiliary: There has been progression of multifocal liver metastases. Index lesion in segment 7 measures 6.9 cm, image 16 of series 5. Previously 5 cm. Index lesion within segment 6 of the liver measures 7.5 cm, image 37 of series 5. Previously 5 cm. Medial segment of left  lobe of liver lesion measures 6 point cm, image 39 of series 5. Previously 4.3 cm. Pancreas: No mass, inflammatory changes, or other significant abnormality. Spleen: Within normal limits in size and appearance. Adrenals/Urinary Tract: Normal appearance of the adrenal glands. No focal kidney abnormality identified. The urinary bladder appears within normal limits. Stomach/Bowel: The stomach is normal. The small bowel loops have a normal caliber. Right lower quadrant colostomy is identified. No pathologic dilatation of the large or small bowel loops. Vascular/Lymphatic: Calcified atherosclerotic disease involves the abdominal aorta. No aneurysm. No enlarged retroperitoneal or mesenteric adenopathy. No enlarged pelvic or inguinal lymph nodes. Reproductive: Prostate gland and seminal vesicles are unremarkable. Other: There is no ascites or focal fluid collections within the abdomen or pelvis. Musculoskeletal: No aggressive lytic or sclerotic bone lesions. Mild multi level spondylosis within the thoracic spine. IMPRESSION: 1. No acute findings within the abdomen or pelvis. 2. Interval progression of multifocal liver metastases. Electronically Signed   By: Kerby Moors M.D.   On: 04/14/2016 10:41   Medications: I have reviewed the patient's current medications.  Assessment/Plan: 1. 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 Microsatellite stable ? K-ras mutation ? Intermediate tumor mutation burden ? Multiple low-attenuation liver lesions on abdominal CTs August 2016, 1 lesion not clearly a cyst ? MRI of the liver 06/01/2015 with multiple ring-enhancing lesions consistent with metastatic disease ? Cycle 1 FOLFOX 06/26/2015 ? Cycle 2 FOLFOX 07/10/2015 ? Cycle 3 FOLFOX 07/24/2015 ? Cycle 4 FOLFOX 08/08/2015 ? CT abdomen and pelvis 08/14/2015, mild progression in  the size of liver lesions compared to the  MRI from 06/01/2015, reviewed in radiology-no apparent new lesions. ? Cycle 5 FOLFOX 08/20/2015 (oxaliplatin dose reduced) ? Cycle 6 FOLFOX 09/11/2015 ? Cycle 7 FOLFOX 09/25/2015 ? Cycle 8 FOLFOX 10/09/2015 (oxaliplatin held due to thrombocytopenia) ? Restaging CT abdomen and pelvis 10/19/2015 with some lesions appearing smaller with the majority stable to slightly larger. ? 10/23/2015 discussed FOLFIRI versus a treatment break. We mutually decided on a treatment break. ? CT abdomen/pelvis 01/04/2016-new and enlarging liver lesions ? Cycle 1 FOLFIRI 01/08/2016 ? Cycle 2 FOLFIRI 01/29/2016 with Neulasta support ? Cycle 3 FOLFIRI 02/13/2016 with Neulasta support ? Cycle 4 FOLFIRI 03/03/2016 (Neulasta held due to elevated white count) ? Cycle 5 FOLFIRI 03/24/2016 with Neulasta support ? Restaging CT scans 04/14/2016 with progression of multifocal liver metastases.    2. Bleeding duodenal ulcer February 2014  3. History of heavy alcohol use  4. Enterobacter bacteremia following surgery August 2016  5. Microcytic anemia secondary to #1, taking ferrous sulfate-improved  6. CT chest 05/15/2015-negative for metastatic disease, fatty mass at the left chest wall-low-grade neoplasm not excluded  7. Port-A-Cath placement 06/20/2015  8. Admission with a partial small bowel dysfunction versus ileus 08/14/2015-improved with NG tube decompression  9. 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 Asfaw has completed 5 cycles of FOLFIRI. The restaging CT evaluation shows progression in the liver. We reviewed the results of the CT scan with him at today's visit. Dr. Benay Spice recommends a hospice referral. Mr. Wadlow is in agreement. We discussed CODE STATUS/end-of-life issues. He will be placed on NO CODE BLUE status.  The abdominal pain is secondary to hepatomegaly, liver metastases. He was given a  prescription for hydrocodone 1 tablet every 6 hours as needed. He understands he should not be driving while taking pain medication.  We will make a referral to the [facility] program. He will return for a follow-up visit in 2 weeks. He will contact the office in the interim with any problems. We specifically discussed poor pain control.  Patient seen with Dr. Benay Spice.    Ned Card ANP/GNP-BC   04/14/2016  11:09 AM  This was a shared visit with Ned Card. Mr. Gencarelli has progressive metastatic colon cancer. We discussed treatment options. I recommend comfort care. He agrees to a [facility] referral. He will be placed on a no CODE BLUE status.   Julieanne Manson, M.D.

## 2016-04-15 LAB — CEA: CEA: 196.3 ng/mL — ABNORMAL HIGH (ref 0.0–4.7)

## 2016-04-16 ENCOUNTER — Ambulatory Visit: Payer: Medicare Other

## 2016-04-27 ENCOUNTER — Other Ambulatory Visit: Payer: Self-pay | Admitting: Oncology

## 2016-04-29 ENCOUNTER — Ambulatory Visit (HOSPITAL_BASED_OUTPATIENT_CLINIC_OR_DEPARTMENT_OTHER): Payer: Medicare Other | Admitting: Oncology

## 2016-04-29 ENCOUNTER — Telehealth: Payer: Self-pay | Admitting: Oncology

## 2016-04-29 VITALS — BP 114/45 | HR 63 | Temp 97.8°F | Resp 17 | Ht 74.0 in | Wt 183.6 lb

## 2016-04-29 DIAGNOSIS — R1010 Upper abdominal pain, unspecified: Secondary | ICD-10-CM

## 2016-04-29 DIAGNOSIS — C180 Malignant neoplasm of cecum: Secondary | ICD-10-CM | POA: Diagnosis present

## 2016-04-29 DIAGNOSIS — G620 Drug-induced polyneuropathy: Secondary | ICD-10-CM

## 2016-04-29 DIAGNOSIS — C787 Secondary malignant neoplasm of liver and intrahepatic bile duct: Secondary | ICD-10-CM

## 2016-04-29 DIAGNOSIS — C189 Malignant neoplasm of colon, unspecified: Secondary | ICD-10-CM

## 2016-04-29 NOTE — Progress Notes (Signed)
Faxed referral to [facility] 

## 2016-04-29 NOTE — Telephone Encounter (Signed)
GAVE PATIENT AVS REPORT AND APPOINTMENTS FOR September.  °

## 2016-04-29 NOTE — Progress Notes (Signed)
  [place] OFFICE PROGRESS NOTE   Diagnosis: Colon cancer  INTERVAL HISTORY:   [facility] Price returns as scheduled. He continues to have pain in the right upper abdomen. The pain is relieved with hydrocodone. No new complaint. He has not been evaluated by the Hospice program.  Objective:  Vital signs in last 24 hours:  Blood pressure (!) 114/45, pulse 63, temperature 97.8 F (36.6 C), temperature source Oral, resp. rate 17, height '6\' 2"'$  (1.88 m), weight 183 lb 9.6 oz (83.3 kg), SpO2 100 %.    Resp: Lungs clear bilaterally Cardio: Regular rate and rhythm GI: The liver is palpable throughout the right upper abdomen with associated tenderness, right lower abdomen ileostomy Vascular: Trace low leg edema bilaterally   Portacath/PICC-without erythema  Lab Results:  Lab Results  Component Value Date   WBC 11.1 (H) 04/14/2016   HGB 10.7 (L) 04/14/2016   HCT 34.2 (L) 04/14/2016   MCV 78.2 (L) 04/14/2016   PLT 160 04/14/2016   NEUTROABS 8.8 (H) 04/14/2016     Lab Results  Component Value Date   CEA1 196.3 (H) 04/14/2016   Medications: I have reviewed the patient's current medications.  Assessment/Plan: 1. 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 Microsatellite stable ? K-ras mutation ? Intermediate tumor mutation burden ? Multiple low-attenuation liver lesions on abdominal CTs August 2016, 1 lesion not clearly a cyst ? MRI of the liver 06/01/2015 with multiple ring-enhancing lesions consistent with metastatic disease ? Cycle 1 FOLFOX 06/26/2015 ? Cycle 2 FOLFOX 07/10/2015 ? Cycle 3 FOLFOX 07/24/2015 ? Cycle 4 FOLFOX 08/08/2015 ? CT abdomen and pelvis 08/14/2015, mild progression in the size of liver lesions compared to the MRI from 06/01/2015, reviewed in radiology-no apparent new lesions. ? Cycle 5 FOLFOX 08/20/2015 (oxaliplatin dose reduced) ? Cycle 6  FOLFOX 09/11/2015 ? Cycle 7 FOLFOX 09/25/2015 ? Cycle 8 FOLFOX 10/09/2015 (oxaliplatin held due to thrombocytopenia) ? Restaging CT abdomen and pelvis 10/19/2015 with some lesions appearing smaller with the majority stable to slightly larger. ? 10/23/2015 discussed FOLFIRI versus a treatment break. We mutually decided on a treatment break. ? CT abdomen/pelvis 01/04/2016-new and enlarging liver lesions ? Cycle 1 FOLFIRI 01/08/2016 ? Cycle 2 FOLFIRI 01/29/2016 with Neulasta support ? Cycle 3 FOLFIRI 02/13/2016 with Neulasta support ? Cycle 4 FOLFIRI 03/03/2016 (Neulasta held due to elevated white count) ? Cycle 5 FOLFIRI 03/24/2016 with Neulasta support ? Restaging CT scans 04/14/2016 with progression of multifocal liver metastases.   2. Bleeding duodenal ulcer February 2014  3. History of heavy alcohol use  4. Enterobacter bacteremia following surgery August 2016  5. Microcytic anemia secondary to #1, taking ferrous sulfate-improved  6. CT chest 05/15/2015-negative for metastatic disease, fatty mass at the left chest wall-low-grade neoplasm not excluded  7. Port-A-Cath placement 06/20/2015  8. Admission with a partial small bowel dysfunction versus ileus 08/14/2015-improved with NG tube decompression  9. History of thrombocytopenia secondary to chemotherapy-improved  10. Oxaliplatin neuropathy    Disposition:  [facility] Price appears unchanged. He has progressive metastatic colon cancer. He agrees to a [facility] referral. We will initiate a hospice referral today.  [facility] Price will return for an office visit and Port-A-Cath flush in 3 weeks. I I recommended he not drive while taking narcotics.  Betsy Coder, MD  04/29/2016  11:10 AM

## 2016-05-20 ENCOUNTER — Encounter: Payer: Self-pay | Admitting: *Deleted

## 2016-05-20 ENCOUNTER — Other Ambulatory Visit: Payer: Self-pay | Admitting: *Deleted

## 2016-05-20 NOTE — Progress Notes (Signed)
No labs needed on 05/21/16 per Dr. Benay Spice.

## 2016-05-21 ENCOUNTER — Ambulatory Visit (HOSPITAL_BASED_OUTPATIENT_CLINIC_OR_DEPARTMENT_OTHER): Admitting: Nurse Practitioner

## 2016-05-21 ENCOUNTER — Other Ambulatory Visit: Payer: Medicare Other

## 2016-05-21 ENCOUNTER — Ambulatory Visit (HOSPITAL_BASED_OUTPATIENT_CLINIC_OR_DEPARTMENT_OTHER): Payer: Medicare Other

## 2016-05-21 ENCOUNTER — Telehealth: Payer: Self-pay | Admitting: Oncology

## 2016-05-21 ENCOUNTER — Other Ambulatory Visit: Payer: Self-pay

## 2016-05-21 VITALS — BP 130/59 | HR 69 | Temp 98.4°F | Resp 17 | Ht 74.0 in | Wt 176.5 lb

## 2016-05-21 DIAGNOSIS — Z95828 Presence of other vascular implants and grafts: Secondary | ICD-10-CM

## 2016-05-21 DIAGNOSIS — G620 Drug-induced polyneuropathy: Secondary | ICD-10-CM | POA: Diagnosis not present

## 2016-05-21 DIAGNOSIS — C180 Malignant neoplasm of cecum: Secondary | ICD-10-CM

## 2016-05-21 DIAGNOSIS — C189 Malignant neoplasm of colon, unspecified: Secondary | ICD-10-CM

## 2016-05-21 DIAGNOSIS — C787 Secondary malignant neoplasm of liver and intrahepatic bile duct: Secondary | ICD-10-CM | POA: Diagnosis not present

## 2016-05-21 MED ORDER — DIPHENOXYLATE-ATROPINE 2.5-0.025 MG PO TABS: 1.0000 | ORAL_TABLET | Freq: Two times a day (BID) | ORAL | 0 refills | Status: AC

## 2016-05-21 MED ORDER — HEPARIN SOD (PORK) LOCK FLUSH 100 UNIT/ML IV SOLN
500.0000 [IU] | Freq: Once | INTRAVENOUS | Status: AC | PRN
Start: 2016-05-21 — End: 2016-05-21
  Administered 2016-05-21: 500 [IU] via INTRAVENOUS
  Filled 2016-05-21: qty 5

## 2016-05-21 MED ORDER — SODIUM CHLORIDE 0.9 % IJ SOLN
10.0000 mL | INTRAMUSCULAR | Status: DC | PRN
  Administered 2016-05-21: 10 mL via INTRAVENOUS
  Filled 2016-05-21: qty 10

## 2016-05-21 MED FILL — DIPHENOXYLATE-ATROP 2.5-0.0: 2.5-0.025 | 45 days supply | Qty: 90 | Fill #0

## 2016-05-21 NOTE — Progress Notes (Signed)
  [place] OFFICE PROGRESS NOTE   Diagnosis:  Colon cancer  INTERVAL HISTORY:   Mr. Fontanilla returns as scheduled. He is enrolled in the hospice program. He reports he is having weekly visits from the nurse. He had right-sided abdominal pain yesterday. He took a single pain pill and the pain resolved. No nausea or vomiting. No significant diarrhea. He continues Lomotil as needed. Appetite is poor.  Objective:  Vital signs in last 24 hours:  Blood pressure (!) 130/59, pulse 69, temperature 98.4 F (36.9 C), temperature source Oral, resp. rate 17, height _0  (1.88 m), weight 176 lb 8 oz (80.1 kg), SpO2 100 %.    HEENT: No thrush or ulcers. Resp: Lungs clear bilaterally. Cardio: Regular rate and rhythm. GI: Liver palpable throughout the right upper abdomen with associated tenderness. Right lower abdomen ileostomy. Vascular: Trace bilateral pretibial edema.   Port-A-Cath without erythema.  Lab Results:  Lab Results  Component Value Date   WBC 11.1 (H) 04/14/2016   HGB 10.7 (L) 04/14/2016   HCT 34.2 (L) 04/14/2016   MCV 78.2 (L) 04/14/2016   PLT 160 04/14/2016   NEUTROABS 8.8 (H) 04/14/2016    Imaging:  No results found.  Medications: I have reviewed the patient's current medications.  Assessment/Plan: 1. 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 Microsatellite stable ? K-ras mutation ? Intermediate tumor mutation burden ? Multiple low-attenuation liver lesions on abdominal CTs August 2016, 1 lesion not clearly a cyst ? MRI of the liver 06/01/2015 with multiple ring-enhancing lesions consistent with metastatic disease ? Cycle 1 FOLFOX 06/26/2015 ? Cycle 2 FOLFOX 07/10/2015 ? Cycle 3 FOLFOX 07/24/2015 ? Cycle 4 FOLFOX 08/08/2015 ? CT abdomen and pelvis 08/14/2015, mild progression in the size of liver lesions compared to the MRI from 06/01/2015,  reviewed in radiology-no apparent new lesions. ? Cycle 5 FOLFOX 08/20/2015 (oxaliplatin dose reduced) ? Cycle 6 FOLFOX 09/11/2015 ? Cycle 7 FOLFOX 09/25/2015 ? Cycle 8 FOLFOX 10/09/2015 (oxaliplatin held due to thrombocytopenia) ? Restaging CT abdomen and pelvis 10/19/2015 with some lesions appearing smaller with the majority stable to slightly larger. ? 10/23/2015 discussed FOLFIRI versus a treatment break. We mutually decided on a treatment break. ? CT abdomen/pelvis 01/04/2016-new and enlarging liver lesions ? Cycle 1 FOLFIRI 01/08/2016 ? Cycle 2 FOLFIRI 01/29/2016 with Neulasta support ? Cycle 3 FOLFIRI 02/13/2016 with Neulasta support ? Cycle 4 FOLFIRI 03/03/2016 (Neulasta held due to elevated white count) ? Cycle 5 FOLFIRI 03/24/2016 with Neulasta support ? Restaging CT scans 04/14/2016 with progression of multifocal liver metastases.   2. Bleeding duodenal ulcer February 2014  3. History of heavy alcohol use  4. Enterobacter bacteremia following surgery August 2016  5. Microcytic anemia secondary to #1, taking ferrous sulfate-improved  6. CT chest 05/15/2015-negative for metastatic disease, fatty mass at the left chest wall-low-grade neoplasm not excluded  7. Port-A-Cath placement 06/20/2015  8. Admission with a partial small bowel dysfunction versus ileus 08/14/2015-improved with NG tube decompression  9. 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 Devino appears unchanged. He is now enrolled in the hospice program. We will continue to follow on a supportive care approach. He will return for a follow-up visit in 4 weeks.  Plan reviewed with Dr. Benay Spice.    Ned Card ANP/GNP-BC   05/21/2016  10:57 AM

## 2016-05-21 NOTE — Telephone Encounter (Signed)
Gave patient avs report and appointments for October  °

## 2016-06-18 ENCOUNTER — Telehealth: Payer: Self-pay | Admitting: *Deleted

## 2016-06-18 ENCOUNTER — Ambulatory Visit: Payer: Medicare Other | Admitting: Oncology

## 2016-06-18 NOTE — Telephone Encounter (Signed)
Received message from [place], Triage RN reporting pt was running late for today's visit.  Called pt, no answer. Voicemail full. Message to schedulers to contact pt with appt.

## 2016-06-23 ENCOUNTER — Telehealth: Payer: Self-pay | Admitting: *Deleted

## 2016-06-23 NOTE — Telephone Encounter (Signed)
Call from Darlys Gales, Hospice RN: "filling in" for pt. Pt needs refill on Lomotil and pain med. He is taking 1 Vicodin tablet for pain daily. It takes about an hour for it to kick in. Wonders if he should try a stronger pain med. Please call main hospice # with any new orders ([phone]) Discussed with Dr. Benay Spice: Will evaluate at office visit.

## 2016-06-24 ENCOUNTER — Ambulatory Visit (HOSPITAL_BASED_OUTPATIENT_CLINIC_OR_DEPARTMENT_OTHER): Payer: Medicare Other | Admitting: Oncology

## 2016-06-24 ENCOUNTER — Telehealth: Payer: Self-pay | Admitting: Oncology

## 2016-06-24 VITALS — BP 122/58 | HR 71 | Temp 98.1°F | Resp 17 | Ht 74.0 in | Wt 186.5 lb

## 2016-06-24 DIAGNOSIS — R630 Anorexia: Secondary | ICD-10-CM | POA: Diagnosis not present

## 2016-06-24 DIAGNOSIS — C180 Malignant neoplasm of cecum: Secondary | ICD-10-CM | POA: Diagnosis not present

## 2016-06-24 DIAGNOSIS — Z23 Encounter for immunization: Secondary | ICD-10-CM

## 2016-06-24 DIAGNOSIS — R109 Unspecified abdominal pain: Secondary | ICD-10-CM | POA: Diagnosis not present

## 2016-06-24 DIAGNOSIS — M7989 Other specified soft tissue disorders: Secondary | ICD-10-CM | POA: Diagnosis not present

## 2016-06-24 DIAGNOSIS — Z932 Ileostomy status: Secondary | ICD-10-CM

## 2016-06-24 DIAGNOSIS — C787 Secondary malignant neoplasm of liver and intrahepatic bile duct: Secondary | ICD-10-CM

## 2016-06-24 DIAGNOSIS — C189 Malignant neoplasm of colon, unspecified: Secondary | ICD-10-CM

## 2016-06-24 MED ORDER — INFLUENZA VAC SPLIT QUAD 0.5 ML IM SUSY
0.5000 mL | PREFILLED_SYRINGE | Freq: Once | INTRAMUSCULAR | Status: AC
  Administered 2016-06-24: 0.5 mL via INTRAMUSCULAR
  Filled 2016-06-24: qty 0.5

## 2016-06-24 MED ORDER — HYDROCODONE-ACETAMINOPHEN 5-325 MG PO TABS: 1.0000 | ORAL_TABLET | Freq: Four times a day (QID) | ORAL | 0 refills | Status: AC | PRN

## 2016-06-24 NOTE — Progress Notes (Signed)
Left message on voicemail for Thurston Pounds, [facility] RN requesting she flush pt's port. Hydrocodone refill faxed to [facility].

## 2016-06-24 NOTE — Progress Notes (Signed)
  [place] OFFICE PROGRESS NOTE   Diagnosis:  Colon cancer  INTERVAL HISTORY:   Harold Price returns after missing a scheduled appointment last week. He is followed by the [facility] program. He has right-sided abdominal pain. He takes hydrocodone once daily. The pain is partially relieved with hydrocodone. He has developed bilateral leg and foot swelling. He reports a diminished appetite. He empties the ileostomy 3-4 times daily. He is taking Imodium and Lomotil each approximately once daily.  Objective:  Vital signs in last 24 hours:  Blood pressure (!) 122/58, pulse 71, temperature 98.1 F (36.7 C), temperature source Oral, resp. rate 17, height _0  (1.88 m), weight 186 lb 8 oz (84.6 kg), SpO2 100 %.    HEENT: [place] over the tongue, no buccal thrush Resp: Lungs clear bilaterally Cardio: Regular rate and rhythm GI: Right lower quadrant ileostomy with liquid stool, the liver is palpable throughout the right abdomen extending to below the ileostomy Vascular: 2+ edema throughout both legs  Portacath/PICC-without erythema   Medications: I have reviewed the patient's current medications.  Assessment/Plan: 1. 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 Microsatellite stable ? K-ras mutation ? Intermediate tumor mutation burden ? Multiple low-attenuation liver lesions on abdominal CTs August 2016, 1 lesion not clearly a cyst ? MRI of the liver 06/01/2015 with multiple ring-enhancing lesions consistent with metastatic disease ? Cycle 1 FOLFOX 06/26/2015 ? Cycle 2 FOLFOX 07/10/2015 ? Cycle 3 FOLFOX 07/24/2015 ? Cycle 4 FOLFOX 08/08/2015 ? CT abdomen and pelvis 08/14/2015, mild progression in the size of liver lesions compared to the MRI from 06/01/2015, reviewed in radiology-no apparent new lesions. ? Cycle 5 FOLFOX 08/20/2015 (oxaliplatin dose  reduced) ? Cycle 6 FOLFOX 09/11/2015 ? Cycle 7 FOLFOX 09/25/2015 ? Cycle 8 FOLFOX 10/09/2015 (oxaliplatin held due to thrombocytopenia) ? Restaging CT abdomen and pelvis 10/19/2015 with some lesions appearing smaller with the majority stable to slightly larger. ? 10/23/2015 discussed FOLFIRI versus a treatment break. We mutually decided on a treatment break. ? CT abdomen/pelvis 01/04/2016-new and enlarging liver lesions ? Cycle 1 FOLFIRI 01/08/2016 ? Cycle 2 FOLFIRI 01/29/2016 with Neulasta support ? Cycle 3 FOLFIRI 02/13/2016 with Neulasta support ? Cycle 4 FOLFIRI 03/03/2016 (Neulasta held due to elevated white count) ? Cycle 5 FOLFIRI 03/24/2016 with Neulasta support ? Restaging CT scans 04/14/2016 with progression of multifocal liver metastases.   2. Bleeding duodenal ulcer February 2014  3. History of heavy alcohol use  4. Enterobacter bacteremia following surgery August 2016  5. Microcytic anemia secondary to #1, taking ferrous sulfate-improved  6. CT chest 05/15/2015-negative for metastatic disease, fatty mass at the left chest wall-low-grade neoplasm not excluded  7. Port-A-Cath placement 06/20/2015  8. Admission with a partial small bowel dysfunction versus ileus 08/14/2015-improved with NG tube decompression  9. History of thrombocytopenia secondary to chemotherapy-improved  10. Oxaliplatin neuropathy    Disposition:  Harold Price has metastatic colon cancer. His performance status is slowly declining. He will continue follow-up with the [facility] program. He will receive an influenza vaccine today.  I encouraged him to use hydrocodone asked if her pain. He will contact us if this does not relieve his pain. Harold Price will return for an office visit in approximately 4 weeks.    Betsy Coder, MD  06/24/2016  11:42 AM

## 2016-06-24 NOTE — Telephone Encounter (Signed)
Avs report and appointment schedule given to patient, per 06/24/16 los. °

## 2016-07-03 ENCOUNTER — Encounter: Payer: Self-pay | Admitting: *Deleted

## 2016-07-03 NOTE — Progress Notes (Signed)
Call received from [facility] with [facility] to notify [facility] that port was flushed today without difficulties.

## 2016-07-08 ENCOUNTER — Telehealth: Payer: Self-pay | Admitting: *Deleted

## 2016-07-08 NOTE — Telephone Encounter (Signed)
Message from [facility], Hospice RN reporting pt has been transferred to [facility] for symptom management/ possible end of life care. Pt had been having issues with N/V.  Will make providers aware.

## 2016-07-29 ENCOUNTER — Ambulatory Visit: Admitting: Nurse Practitioner

## 2016-08-15 DEATH — deceased

## 2019-01-29 ENCOUNTER — Encounter
